# Patient Record
Sex: Female | Born: 1995 | Race: Black or African American | Hispanic: No | Marital: Single | Smoking: Former smoker
Health system: Southern US, Community
[De-identification: ages and names within clinical notes are randomized; demographics above are authoritative.]

## PROBLEM LIST (undated history)

## (undated) DIAGNOSIS — J45909 Unspecified asthma, uncomplicated: Secondary | ICD-10-CM

## (undated) DIAGNOSIS — A749 Chlamydial infection, unspecified: Secondary | ICD-10-CM

## (undated) HISTORY — PX: NO PAST SURGERIES: SHX2092

---
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
## 2015-03-25 NOTE — Discharge Summary (Signed)
PATIENT NAME:  Kathryn Nash, Kathryn Nash MR#:  [id number] DATE OF BIRTH:  02/05/1996  DATE OF ADMISSION:  01/09/2013  DATE OF DISCHARGE:  01/13/2013  PRIMARY CARE PHYSICIAN:  None local.  FINAL DIAGNOSES:   1.  Acute asthmatic exacerbation.  2.  Acute bronchitis.  3.  Leukocytosis.   CONDITION:  Stable.   CODE STATUS:  FULL CODE.   HOME MEDICATIONS:   1.  Proventil HFA 90 mcg inhalation 2 puffs 4 times a day p.r.n. for shortness of breath.  2.  Xopenex 1.25 mg/0.5 mL inhalation solution, 0.5 mL inhaled 3 times a day p.r.n. for shortness of breath.  3.  Qvar 40 mcg inhalation aerosol 2 puffs b.i.d.  4.  Prednisone 10 mg p.o. 4 tablets once a day for 2 days, then 20 mg p.o. daily for 2 days, and then 10 mg p.o. daily for 2 days.   DIET:  Regular diet.   ACTIVITY:  As tolerated.   FOLLOWUP CARE:  Follow up with PCP within 1 week. Followup CBC with PCP.   REASON FOR ADMISSION:  Wheezing and increased shortness of breath.   HOSPITAL COURSE:  The patient is a 19 year old African American female with a history of asthma since childhood and eczema, developed  shortness of breath for a few days with cough and green sputum. The patient also has wheezing. For detailed history and physical examination, please refer to the admission note dictated by Dr. Rudene Rearwish. The patient's x-ray on admission was negative for pneumonia. The patient was admitted for asthma exacerbation with acute bronchitis. After admission, she has been treated with IV Solu-Medrol, nebulizer with albuterol and Atrovent. In addition, the patient has been treated with Zithromax for acute bronchitis. The patient's symptoms have much improved with the above-mentioned treatment. She is off oxygen without shortness of breath. The patient is clinically stable and will be discharged to home today. I discussed the patient's discharge plan with the patient and her mother.   TIME SPENT: About 35 minutes.   ____________________________ Kathryn PollackQing Donley Harland,  MD qc:dm D: 01/13/2013 15:01:15 ET T: 01/13/2013 20:58:18 ET JOB#: [id number]  cc: Kathryn PollackQing Darnell Stimson, MD, <Dictator> Kathryn PollackQING Chino Sardo MD ELECTRONICALLY SIGNED 01/15/2013 20:27

## 2015-03-25 NOTE — H&P (Signed)
PATIENT NAME:  Kathryn Nash, Kathryn Nash MR#:  [id number] DATE OF BIRTH:  08/05/1996  DATE OF ADMISSION:  01/09/2013  PRIMARY CARE PHYSICIAN:  Dr. Geraldo Pitter in [place].   REFERRING PHYSICIAN:  Dr. Maricela Bo.   CHIEF COMPLAINT:  Wheezing and increased shortness of breath.   HISTORY OF PRESENT ILLNESS:  The patient is a 19 year old African American female with history of asthma since childhood and eczema.  She was in her usual state of health until Sunday, a few days ago, when started having cough associated with green sputum and then gradually developed shortness of breath and wheezing.  Denied having any fever.  No chills.  She ended at the Emergency Department on Wednesday, treated and discharged home on prednisone tapering doses, however she got sick again today in school, more wheezing, cough and shortness of breath.  She ended back here at the Emergency Department again wheezing and a picture of acute asthmatic attack.  Her chest x-ray is negative for pneumonia.  The patient received several treatments with a breathing treatment using a bronchodilator.  Now in the process to be admitted for further treatment.   REVIEW OF SYSTEMS:  CONSTITUTIONAL:  Denies any fever.  No chills.  No fatigue.  EYES:  No blurring of vision.  No double vision.  EARS, NOSE, THROAT:  No hearing impairment.  No sore throat.  No dysphagia.  CARDIOVASCULAR:  No chest pain, but reports shortness of breath and wheezing.  No syncope.  No palpitations.  RESPIRATORY:  Cough, sputum production, wheezing and shortness of breath.  No hemoptysis.  No chest pain.  GASTROINTESTINAL:  No abdominal pain, no vomiting, no diarrhea.  GENITOURINARY:  No dysuria.  No frequency of urination.  Her last menstrual period started a week ago and ended yesterday.  MUSCULOSKELETAL:  No joint swelling or pain.  No muscular pain or swelling.  INTEGUMENTARY:  No skin rash.  No ulcers.  NEUROLOGY:  No focal weakness.  No seizure activity.  No headache.   PSYCHIATRY:  No anxiety.  No depression.  ENDOCRINE:  No polyuria or polydipsia.  No heat or cold intolerance.  HEMATOLOGY:  No easy bruisability.  No lymph node enlargement.   PAST MEDICAL HISTORY:  Asthma and eczema.   PAST SURGICAL HISTORY:  None.   FAMILY HISTORY:  Both parents are healthy.  Her maternal grandmother has asthma.   SOCIAL HABITS:  Nonsmoker.  No history of alcohol or drug abuse.   SOCIAL HISTORY:  She is a ninth grader and she lives at home with her mother.   ADMISSION MEDICATIONS:  Xopenex inhalation 3 times a day as needed, QVAR 40 mcg inhalation 2 puffs twice a day, Proventil HFA 2 puffs 4 times a day as needed and she is on a prednisone tapering taking 50 mg a day.   ALLERGIES:  SINGULAR CAUSING SKIN RASH.  EGGS AND FISH CAUSING SKIN RASH AND HIVES.   PHYSICAL EXAMINATION: VITAL SIGNS:  Blood pressure 110/67, respiratory rate 24, pulse 98, temperature 97.7, oxygen saturation 97%.  GENERAL APPEARANCE:  A young female lying in bed in no acute distress at the time of my examination.  HEAD AND NECK:  No pallor.  No icterus.  No cyanosis.  EARS, NOSE, THROAT:  Ear examination revealed normal hearing, no discharge, no lesions.  Nasal mucosa was normal, no discharge, no bleeding.  No ulcers.  Oropharyngeal area was normal without exudate.  No oral thrush.  EYES:  Normal eyelids and conjunctivae.  Pupils about 5 to  6 mm, equal, round and reactive to light.  NECK:  Supple.  Trachea at midline.  No thyromegaly.  No cervical lymphadenopathy.  No masses.  HEART:  Normal S1, S2.  No S3, S4.  No murmur.  No gallop.  No carotid bruits.  RESPIRATORY:  Normal breathing pattern at time of my examination without use of accessory muscles.  Slight prolongation of the expiratory phase and bilateral rhonchi and expiratory wheezing.  No rales.  ABDOMEN:  Soft without tenderness.  No hepatosplenomegaly.  No masses.  No hernias.  SKIN:  No ulcers.  No subcutaneous nodules.   MUSCULOSKELETAL:  No joint swelling.  No clubbing.  NEUROLOGIC:  Cranial nerves II-XII are intact.  No focal motor deficit.  PSYCHIATRIC:  The patient is alert, oriented x 3.  Mood and affect were normal.   LABORATORY FINDINGS:  Chest x-ray showed no acute cardiopulmonary abnormalities.  No consolidation.  No effusion.  CBC showed white count of 19,000, hemoglobin 14, hematocrit 40, platelet count 434.  Serum glucose 116, BUN 16, creatinine 0.6, sodium 141, potassium 3.2.   ASSESSMENT: 1.  Acute asthmatic attack.  2.  Acute bronchitis.  3.  Mild hypokalemia.  4.  Leukocytosis related to the event of acute asthma and bronchitis and probably the steroid effect as well.  5.  History of eczema.   PLAN:  We will admit to the medical floor.  Continue bronchodilator therapy with albuterol and Atrovent.  A small dose of IV Solu-Medrol.  I will give the patient antibiotic since her symptoms started with the cough and green sputum assuming that acute bronchitis had precipitated her acute asthma.  Potassium supplementation to correct the hypokalemia.  We will monitor her response.  She already started to feel better than when she came.  Would like to mention that her last menstrual period again was a week ago and ended yesterday. I encourage early ambulation to prevent DVT. I answered the questions of her mother who was in her room.   TIME SPENT IN EVALUATING THIS PATIENT:  Took more than 50 minutes.     ____________________________ Carney CornersAmir M. Rudene Rearwish, MD amd:ea D: 01/09/2013 23:13:14 ET T: 01/10/2013 00:16:53 ET JOB#: [id number]  cc: Carney CornersAmir M. Rudene Rearwish, MD, <Dictator> Karolee OhsAMIR Dala DockM Kord Monette MD ELECTRONICALLY SIGNED 01/10/2013 1:28

## 2015-03-27 NOTE — H&P (Signed)
    Subjective/Chief Complaint 19 yo F with asthma exacerbation    History of Present Illness 19 yo F with asthma presents with asthma exacerbation.  Per mother, she was well until 2 days ago when she developed a slight cough.  Yesterday started to feel like she couldn't breathe, and started breathing treatments yesterday evening.  Had 4 breathing treatments prior to heading to the ED this afternoon.  In the ED when she was first seen, her initial sats where in the high 80s (per report from ED 88-89.  She was given 3 duonebs (albuterol 3 mg and atrovent 0.5 mg), 1 albuterol neb of 2.5 mg, and 40 mg of oral prednisone.   Sats improved to 92-95 and then after about 15-30 minutes she would dip again to the 88-89.  Was  given another 20 mg of prednisone and then given Albuterol 5 mg x 1 then after another hour Albuterol 5 mg.  Sats came up and then went back down again.  ED put her on oxygen (4L) but were unable to maintain her sats above 90.  Increased oxygen to 6L and sats went up to 92.  When she got to the floor they switched her to a venti mask with FI02 55% and were able to maintain sats at 94 at that point.    Past History Asthma    Primary Physician No PCP   Past Med/Surgical Hx:  asthma:   ALLERGIES:  Singulair: Rash  Review of Systems:   Fever/Chills No    Cough Yes    Abdominal Pain No    Nausea/Vomiting No    SOB/DOE Yes    Chest Pain Yes   Physical Exam:   GEN no acute distress    HEENT moist oral mucosa    NECK supple    RESP RR in 20s, shortened insp and exp phase,  decreased breath sounds in right lower lobe. no retractions or grunting, no wheezing    CARD regular rate  no murmur    LYMPH no LAD   Routine Hem:  05-May-13 21:22    WBC (CBC) 14.9   RBC (CBC) 4.95   Hemoglobin (CBC) 15.2   Hematocrit (CBC) 43.9   Platelet Count (CBC) 325   MCV 89   MCH 30.6   MCHC 34.5   RDW 12.5  Routine Chem:  05-May-13 21:22    Glucose, Serum 103   BUN 9    Creatinine (comp) 0.76   Sodium, Serum 139   Potassium, Serum 3.9   Chloride, Serum 103   CO2, Serum 25   Calcium (Total), Serum 9.6   Anion Gap 11   Osmolality (calc) 276     Assessment/Admission Diagnosis 19 yo with Asthma exacerbation.  Will transfer to [facility] as I discussed case with [facility] and the next option is continuous albuterol.  Discussed we were unable to do this on our floor and [facility] accepted patient.    Plan see above   Electronic Signatures: Pryor MontesMelton, Ziyanna Tolin A (MD)  (Signed 06-May-13 00:18)  Authored: CHIEF COMPLAINT and HISTORY, PAST MEDICAL/SURGIAL HISTORY, ALLERGIES, REVIEW OF SYSTEMS, PHYSICAL EXAM, LABS, ASSESSMENT AND PLAN   Last Updated: 06-May-13 00:18 by Pryor MontesMelton, Reene Harlacher A (MD)

## 2015-04-29 ENCOUNTER — Emergency Department: Payer: Medicaid Other

## 2015-04-29 ENCOUNTER — Emergency Department
Admission: EM | Admit: 2015-04-29 | Discharge: 2015-04-29 | Disposition: A | Discharge status: Home / Self Care | Payer: Medicaid Other | Attending: Emergency Medicine | Admitting: Emergency Medicine

## 2015-04-29 ENCOUNTER — Encounter: Payer: Self-pay | Admitting: Emergency Medicine

## 2015-04-29 DIAGNOSIS — J069 Acute upper respiratory infection, unspecified: Secondary | ICD-10-CM | POA: Diagnosis not present

## 2015-04-29 DIAGNOSIS — J45901 Unspecified asthma with (acute) exacerbation: Secondary | ICD-10-CM

## 2015-04-29 DIAGNOSIS — R05 Cough: Secondary | ICD-10-CM | POA: Diagnosis present

## 2015-04-29 HISTORY — DX: Unspecified asthma, uncomplicated: J45.909

## 2015-04-29 MED ORDER — IPRATROPIUM-ALBUTEROL 0.5-2.5 (3) MG/3ML IN SOLN
RESPIRATORY_TRACT | Status: AC
  Filled 2015-04-29: qty 3

## 2015-04-29 MED ORDER — IPRATROPIUM-ALBUTEROL 0.5-2.5 (3) MG/3ML IN SOLN
3.0000 mL | Freq: Once | RESPIRATORY_TRACT | Status: AC
  Administered 2015-04-29: 3 mL via RESPIRATORY_TRACT

## 2015-04-29 MED ORDER — PREDNISONE 10 MG PO TABS: ORAL_TABLET | ORAL | Status: DC

## 2015-04-29 MED ORDER — PREDNISONE 20 MG PO TABS
ORAL_TABLET | ORAL | Status: AC
  Filled 2015-04-29: qty 3

## 2015-04-29 MED ORDER — PREDNISONE 20 MG PO TABS
60.0000 mg | ORAL_TABLET | Freq: Once | ORAL | Status: AC
  Administered 2015-04-29: 60 mg via ORAL

## 2015-04-29 NOTE — Discharge Instructions (Signed)
Take medication as prescribed. Continue using home albuterol nebulizers and inhaler as needed. Rest. Drink clear fluids.  Follow up with your primary care physician next week.  Return to the ER for new or worsening concerns  Asthma Asthma is a condition of the lungs in which the airways tighten and narrow. Asthma can make it hard to breathe. Asthma cannot be cured, but medicine and lifestyle changes can help control it. Asthma may be started (triggered) by:  Animal skin flakes (dander).  Dust.  Cockroaches.  Pollen.  Mold.  Smoke.  Cleaning products.  Hair sprays or aerosol sprays.  Paint fumes or strong smells.  Cold air, weather changes, and winds.  Crying or laughing hard.  Stress.  Certain medicines or drugs.  Foods, such as dried fruit, potato chips, and sparkling grape juice.  Infections or conditions (colds, flu).  Exercise.  Certain medical conditions or diseases.  Exercise or tiring activities. HOME CARE   Take medicine as told by your doctor.  Use a peak flow meter as told by your doctor. A peak flow meter is a tool that measures how well the lungs are working.  Record and keep track of the peak flow meter's readings.  Understand and use the asthma action plan. An asthma action plan is a written plan for taking care of your asthma and treating your attacks.  To help prevent asthma attacks:  Do not smoke. Stay away from secondhand smoke.  Change your heating and air conditioning filter often.  Limit your use of fireplaces and wood stoves.  Get rid of pests (such as roaches and mice) and their droppings.  Throw away plants if you see mold on them.  Clean your floors. Dust regularly. Use cleaning products that do not smell.  Have someone vacuum when you are not home. Use a vacuum cleaner with a HEPA filter if possible.  Replace carpet with wood, tile, or vinyl flooring. Carpet can trap animal skin flakes and dust.  Use allergy-proof  pillows, mattress covers, and box spring covers.  Wash bed sheets and blankets every week in hot water and dry them in a dryer.  Use blankets that are made of polyester or cotton.  Clean bathrooms and kitchens with bleach. If possible, have someone repaint the walls in these rooms with mold-resistant paint. Keep out of the rooms that are being cleaned and painted.  Wash hands often. GET HELP IF:  You have make a whistling sound when breaking (wheeze), have shortness of breath, or have a cough even if taking medicine to prevent attacks.  The colored mucus you cough up (sputum) is thicker than usual.  The colored mucus you cough up changes from clear or white to yellow, green, gray, or bloody.  You have problems from the medicine you are taking such as:  A rash.  Itching.  Swelling.  Trouble breathing.  You need reliever medicines more than 2-3 times a week.  Your peak flow measurement is still at 50-79% of your personal best after following the action plan for 1 hour.  You have a fever. GET HELP RIGHT AWAY IF:   You seem to be worse and are not responding to medicine during an asthma attack.  You are short of breath even at rest.  You get short of breath when doing very little activity.  You have trouble eating, drinking, or talking.  You have chest pain.  You have a fast heartbeat.  Your lips or fingernails start to turn blue.  You  are light-headed, dizzy, or faint.  Your peak flow is less than 50% of your personal best. MAKE SURE YOU:   Understand these instructions.  Will watch your condition.  Will get help right away if you are not doing well or get worse. Document Released: 05/07/2008 Document Revised: 04/05/2014 Document Reviewed: 06/18/2013 [facility] Patient Information 2015 [place]. This information is not intended to replace advice given to you by your health care provider. Make sure you discuss any questions you have with your health care  provider.  Upper Respiratory Infection, Adult An upper respiratory infection (URI) is also sometimes known as the common cold. The upper respiratory tract includes the nose, sinuses, throat, trachea, and bronchi. Bronchi are the airways leading to the lungs. Most people improve within 1 week, but symptoms can last up to 2 weeks. A residual cough may last even longer.  CAUSES Many different viruses can infect the tissues lining the upper respiratory tract. The tissues become irritated and inflamed and often become very moist. Mucus production is also common. A cold is contagious. You can easily spread the virus to others by oral contact. This includes kissing, sharing a glass, coughing, or sneezing. Touching your mouth or nose and then touching a surface, which is then touched by another person, can also spread the virus. SYMPTOMS  Symptoms typically develop 1 to 3 days after you come in contact with a cold virus. Symptoms vary from person to person. They may include:  Runny nose.  Sneezing.  Nasal congestion.  Sinus irritation.  Sore throat.  Loss of voice (laryngitis).  Cough.  Fatigue.  Muscle aches.  Loss of appetite.  Headache.  Low-grade fever. DIAGNOSIS  You might diagnose your own cold based on familiar symptoms, since most people get a cold 2 to 3 times a year. Your caregiver can confirm this based on your exam. Most importantly, your caregiver can check that your symptoms are not due to another disease such as strep throat, sinusitis, pneumonia, asthma, or epiglottitis. Blood tests, throat tests, and X-rays are not necessary to diagnose a common cold, but they may sometimes be helpful in excluding other more serious diseases. Your caregiver will decide if any further tests are required. RISKS AND COMPLICATIONS  You may be at risk for a more severe case of the common cold if you smoke cigarettes, have chronic heart disease (such as heart failure) or lung disease (such as  asthma), or if you have a weakened immune system. The very young and very old are also at risk for more serious infections. Bacterial sinusitis, middle ear infections, and bacterial pneumonia can complicate the common cold. The common cold can worsen asthma and chronic obstructive pulmonary disease (COPD). Sometimes, these complications can require emergency medical care and may be life-threatening. PREVENTION  The best way to protect against getting a cold is to practice good hygiene. Avoid oral or hand contact with people with cold symptoms. Wash your hands often if contact occurs. There is no clear evidence that vitamin C, vitamin E, echinacea, or exercise reduces the chance of developing a cold. However, it is always recommended to get plenty of rest and practice good nutrition. TREATMENT  Treatment is directed at relieving symptoms. There is no cure. Antibiotics are not effective, because the infection is caused by a virus, not by bacteria. Treatment may include:  Increased fluid intake. Sports drinks offer valuable electrolytes, sugars, and fluids.  Breathing heated mist or steam (vaporizer or shower).  Eating chicken soup or other  clear broths, and maintaining good nutrition.  Getting plenty of rest.  Using gargles or lozenges for comfort.  Controlling fevers with ibuprofen or acetaminophen as directed by your caregiver.  Increasing usage of your inhaler if you have asthma. Zinc gel and zinc lozenges, taken in the first 24 hours of the common cold, can shorten the duration and lessen the severity of symptoms. Pain medicines may help with fever, muscle aches, and throat pain. A variety of non-prescription medicines are available to treat congestion and runny nose. Your caregiver can make recommendations and may suggest nasal or lung inhalers for other symptoms.  HOME CARE INSTRUCTIONS   Only take over-the-counter or prescription medicines for pain, discomfort, or fever as directed by your  caregiver.  Use a warm mist humidifier or inhale steam from a shower to increase air moisture. This may keep secretions moist and make it easier to breathe.  Drink enough water and fluids to keep your urine clear or pale yellow.  Rest as needed.  Return to work when your temperature has returned to normal or as your caregiver advises. You may need to stay home longer to avoid infecting others. You can also use a face mask and careful hand washing to prevent spread of the virus. SEEK MEDICAL CARE IF:   After the first few days, you feel you are getting worse rather than better.  You need your caregiver's advice about medicines to control symptoms.  You develop chills, worsening shortness of breath, or brown or red sputum. These may be signs of pneumonia.  You develop yellow or brown nasal discharge or pain in the face, especially when you bend forward. These may be signs of sinusitis.  You develop a fever, swollen neck glands, pain with swallowing, or white areas in the back of your throat. These may be signs of strep throat. SEEK IMMEDIATE MEDICAL CARE IF:   You have a fever.  You develop severe or persistent headache, ear pain, sinus pain, or chest pain.  You develop wheezing, a prolonged cough, cough up blood, or have a change in your usual mucus (if you have chronic lung disease).  You develop sore muscles or a stiff neck. Document Released: 05/15/2001 Document Revised: 02/11/2012 Document Reviewed: 02/24/2014 [facility] Patient Information 2015 [place], [place]. This information is not intended to replace advice given to you by your health care provider. Make sure you discuss any questions you have with your health care provider.

## 2015-04-29 NOTE — ED Provider Notes (Signed)
[facility] Emergency Department Provider Note ____________________________________________  Time seen: Approximately 1:45 PM  I have reviewed the triage vital signs and the nursing notes.   HISTORY  Chief Complaint Chest Pain and Cough   HPI Kathryn Nash is a 19 y.o. female presents to the ER for complaints of 3 days of runny nose, cough, congestion. Patient states with intermittent shortness of breath with coughing. patient states that she has asthma which flares up each time she is sick.Reports this is similar to her normal flare ups.  Patient denies current chest pain, reports intermittent chest pain described as soreness on sides from coughing.States chest feels "tight" with wheezes. Denies current chest pain or shortness of breath.   Denies fever. Patient denies current chest pain or shortness of breath. Reports intermittent wheezing, states worse at night. Denies abdominal pain, nausea, vomiting, diarrhea. Reports continues to eat and drink well.   Past Medical History  Diagnosis Date  . Asthma     There are no active problems to display for this patient.   History reviewed. No pertinent past surgical history.  Current Outpatient Rx  Name  Route  Sig  Dispense  Refill               Albuterol nebs ProAir inhaler   Allergies Montelukast sodium  No family history on file.  Social History History  Substance Use Topics  . Smoking status: Never Smoker   . Smokeless tobacco: Not on file  . Alcohol Use: No    Review of Systems Constitutional: No fever/chills Eyes: No visual changes. ENT: Positive for congestion and sore throat Cardiovascular: Denies chest pain. Respiratory: Positive for cough. Positive for wheezes. Positive for intermittent shortness of breath with cough and wheezing Gastrointestinal: No abdominal pain.  No nausea, no vomiting.  No diarrhea.  No constipation. Genitourinary: Negative for dysuria. Musculoskeletal:  Negative for back pain. Skin: Negative for rash. Neurological: Negative for headaches, focal weakness or numbness.  10-point ROS otherwise negative.  ____________________________________________   PHYSICAL EXAM:  VITAL SIGNS: ED Triage Vitals  Enc Vitals Group     BP 04/29/15 1216 108/63 mmHg     Pulse Rate 04/29/15 1216 91     Resp 04/29/15 1216 18     Temp 04/29/15 1216 97.5 F (36.4 C)     Temp Source 04/29/15 1216 Oral     SpO2 04/29/15 1216 99 %     Weight 04/29/15 1216 150 lb (68.04 kg)     Height 04/29/15 1216 5\' 6"  (1.676 m)     Head Cir --      Peak Flow --      Pain Score 04/29/15 1216 6     Pain Loc --      Pain Edu? --      Excl. in GC? --     Constitutional: Alert and oriented. Well appearing and in no acute distress. Eyes: Conjunctivae are normal. PERRL. EOMI. Head: Atraumatic. Ears: no erythema, normal TMs.  Nose: Mild clear rhinorrhea and congestion noted Mouth/Throat: Mucous membranes are moist.  Oropharynx non-erythematous. Neck: No stridor.  No cervical spine tenderness to palpation. Hematological/Lymphatic/Immunilogical: No cervical lymphadenopathy. Cardiovascular: Normal rate, regular rhythm. Grossly normal heart sounds.  Good peripheral circulation. Respiratory: Normal respiratory effort.  No retractions. Mild wheezes throughout. No rhonchi or rales Gastrointestinal: Soft and nontender. No distention. No abdominal bruits. No CVA tenderness. Musculoskeletal: No lower extremity tenderness nor edema.  No joint effusions. Neurologic:  Normal speech and language. No  gross focal neurologic deficits are appreciated. Speech is normal. No gait instability. Skin:  Skin is warm, dry and intact. No rash noted. Psychiatric: Mood and affect are normal. Speech and behavior are normal.   RADIOLOGY  CHEST 2 VIEW  COMPARISON: November 21, 2012  FINDINGS: Lungs are clear. Heart size and pulmonary vascularity are normal. No adenopathy. No pneumothorax. No  bone lesions.  IMPRESSION: No edema or consolidation.   Electronically Signed By: Bretta Bang III M.D. On: 04/29/2015 13:57 ________________________________________   INITIAL IMPRESSION / ASSESSMENT AND PLAN / ED COURSE  Pertinent labs & imaging results that were available during my care of the patient were reviewed by me and considered in my medical decision making (see chart for details).  No acute distress. Very well-appearing patient. Patient reports she has had 3 days history of runny nose cough and congestion. Patient states history of asthma and she is having intermittent wheezing and shortness of breath with coughing. Denies current chest pain or shortness of breath.  1440: Chest x-ray negative for edema or consolidation. Suspect a viral upper respiratory infection with acute asthma exacerbation. Patient reports improved in ER and feeling better. Denies chest pain or shortness of breath. Patient states wants to go home. Patient to continue home albuterol nebs as needed. Will discharge with prednisone taper. Patient followed closely next week with primary care physician discussed return parameters.   ____________________________________________   FINAL CLINICAL IMPRESSION(S) / ED DIAGNOSES  Final diagnoses:  Upper respiratory infection  Asthma, unspecified asthma severity, with acute exacerbation      Renford Dills, NP 04/29/15 1502  Minna Antis, MD 04/29/15 [phone]

## 2015-04-29 NOTE — ED Notes (Signed)
Patient states she has chest pain with cough. States symptoms began with sore throat approx one week ago. Patient in no distress.

## 2015-04-29 NOTE — ED Notes (Signed)
Pt reports fever today; cough x3 days; pt reports rib pain with cough and shortness of breath; pt with hx of asthma.

## 2015-04-29 NOTE — ED Notes (Signed)
Pt informed to return if any life threatening symptoms occur.  

## 2015-05-09 ENCOUNTER — Encounter: Payer: Self-pay | Admitting: Emergency Medicine

## 2015-05-09 ENCOUNTER — Emergency Department
Admission: EM | Admit: 2015-05-09 | Discharge: 2015-05-09 | Disposition: A | Discharge status: Home / Self Care | Payer: Medicaid Other | Attending: Emergency Medicine | Admitting: Emergency Medicine

## 2015-05-09 DIAGNOSIS — S01112A Laceration without foreign body of left eyelid and periocular area, initial encounter: Secondary | ICD-10-CM | POA: Diagnosis not present

## 2015-05-09 DIAGNOSIS — Y9389 Activity, other specified: Secondary | ICD-10-CM | POA: Diagnosis not present

## 2015-05-09 DIAGNOSIS — Z7952 Long term (current) use of systemic steroids: Secondary | ICD-10-CM | POA: Insufficient documentation

## 2015-05-09 DIAGNOSIS — S0181XA Laceration without foreign body of other part of head, initial encounter: Secondary | ICD-10-CM | POA: Diagnosis present

## 2015-05-09 DIAGNOSIS — Y9289 Other specified places as the place of occurrence of the external cause: Secondary | ICD-10-CM | POA: Insufficient documentation

## 2015-05-09 DIAGNOSIS — Y998 Other external cause status: Secondary | ICD-10-CM | POA: Diagnosis not present

## 2015-05-09 DIAGNOSIS — S0512XA Contusion of eyeball and orbital tissues, left eye, initial encounter: Secondary | ICD-10-CM

## 2015-05-09 MED ORDER — FLUORESCEIN SODIUM 1 MG OP STRP
ORAL_STRIP | OPHTHALMIC | Status: AC
  Administered 2015-05-09: 1
  Filled 2015-05-09: qty 1

## 2015-05-09 MED ORDER — TRAMADOL HCL 50 MG PO TABS: 50.0000 mg | ORAL_TABLET | Freq: Two times a day (BID) | ORAL | Status: DC

## 2015-05-09 MED ORDER — TRAMADOL HCL 50 MG PO TABS
ORAL_TABLET | ORAL | Status: AC
  Administered 2015-05-09: 100 mg via ORAL
  Filled 2015-05-09: qty 2

## 2015-05-09 MED ORDER — TETRACAINE HCL 0.5 % OP SOLN
OPHTHALMIC | Status: AC
  Administered 2015-05-09: 1 [drp]
  Filled 2015-05-09: qty 2

## 2015-05-09 MED ORDER — TRAMADOL HCL 50 MG PO TABS
100.0000 mg | ORAL_TABLET | Freq: Once | ORAL | Status: AC
  Administered 2015-05-09: 100 mg via ORAL
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
## 2015-05-09 NOTE — Discharge Instructions (Signed)
Assault, General Assault includes any behavior, whether intentional or reckless, which results in bodily injury to another person and/or damage to property. Included in this would be any behavior, intentional or reckless, that by its nature would be understood (interpreted) by a reasonable person as intent to harm another person or to damage his/her property. Threats may be oral or written. They may be communicated through regular mail, computer, fax, or phone. These threats may be direct or implied. FORMS OF ASSAULT INCLUDE:  Physically assaulting a person. This includes physical threats to inflict physical harm as well as:  Slapping.  Hitting.  Poking.  Kicking.  Punching.  Pushing.  Arson.  Sabotage.  Equipment vandalism.  Damaging or destroying property.  Throwing or hitting objects.  Displaying a weapon or an object that appears to be a weapon in a threatening manner.  Carrying a firearm of any kind.  Using a weapon to harm someone.  Using greater physical size/strength to intimidate another.  Making intimidating or threatening gestures.  Bullying.  Hazing.  Intimidating, threatening, hostile, or abusive language directed toward another person.  It communicates the intention to engage in violence against that person. And it leads a reasonable person to expect that violent behavior may occur.  Stalking another person. IF IT HAPPENS AGAIN:  Immediately call for emergency help (911 in [place]  If someone poses clear and immediate danger to you, seek legal authorities to have a protective or restraining order put in place.  Less threatening assaults can at least be reported to authorities. STEPS TO TAKE IF A SEXUAL ASSAULT HAS HAPPENED  Go to an area of safety. This may include a shelter or staying with a friend. Stay away from the area where you have been attacked. A large percentage of sexual assaults are caused by a friend, relative or associate.  If  medications were given by your caregiver, take them as directed for the full length of time prescribed.  Only take over-the-counter or prescription medicines for pain, discomfort, or fever as directed by your caregiver.  If you have come in contact with a sexual disease, find out if you are to be tested again. If your caregiver is concerned about the HIV/AIDS virus, he/she may require you to have continued testing for several months.  For the protection of your privacy, test results can not be given over the phone. Make sure you receive the results of your test. If your test results are not back during your visit, make an appointment with your caregiver to find out the results. Do not assume everything is normal if you have not heard from your caregiver or the medical facility. It is important for you to follow up on all of your test results.  File appropriate papers with authorities. This is important in all assaults, even if it has occurred in a family or by a friend. SEEK MEDICAL CARE IF:  You have new problems because of your injuries.  You have problems that may be because of the medicine you are taking, such as:  Rash.  Itching.  Swelling.  Trouble breathing.  You develop belly (abdominal) pain, feel sick to your stomach (nausea) or are vomiting.  You begin to run a temperature.  You need supportive care or referral to a rape crisis center. These are centers with trained personnel who can help you get through this ordeal. SEEK IMMEDIATE MEDICAL CARE IF:  You are afraid of being threatened, beaten, or abused. In [place] call 911.  You  receive new injuries related to abuse.  You develop severe pain in any area injured in the assault or have any change in your condition that concerns you.  You faint or lose consciousness.  You develop chest pain or shortness of breath. Document Released: 11/19/2005 Document Revised: 02/11/2012 Document Reviewed: 07/07/2008 [facility] Patient  Information 2015 [place], [place]. This information is not intended to replace advice given to you by your health care provider. Make sure you discuss any questions you have with your health care provider.  Eye Contusion  An eye contusion is a deep bruise of the eye. It is often called a "black eye." Contusions happen when an injury causes bleeding under the skin. Signs of bruising include pain, puffiness (swelling), and discolored skin. The contusion may turn blue, purple, or yellow. A black eye can be very serious and can affect the eyeball and sight. HOME CARE  Put ice on the injured area.  Put ice in a plastic bag.  Place a towel between your skin and the bag.  Leave the ice on for 15-20 minutes, 03-04 times a day.  If there is no injury to the eye, you may keep doing normal activities.  Wear sunglasses if bright lights bother you.  Sleep with your head raised (elevated) to help with discomfort.  Only take medicines as told by your doctor. GET HELP RIGHT AWAY IF:  You notice any vision loss.  You see two of everything (double vision).  You feel sick to your stomach (nauseous).  You feel dizzy, sleepy, or like you will pass out (faint).  You have any fluid coming from your eye or nose.  You have puffiness and bruising that does not fade. MAKE SURE YOU:   Understand these instructions.  Will watch your condition.  Will get help right away if you are not doing well or get worse. Document Released: 11/08/2011 Document Revised: 02/11/2012 Document Reviewed: 11/08/2011 [facility] Patient Information 2015 [place], [place]. This information is not intended to replace advice given to you by your health care provider. Make sure you discuss any questions you have with your health care provider.  Facial Laceration A facial laceration is a cut on the face. These injuries can be painful and cause bleeding. Some cuts may need to be closed with stitches (sutures), skin adhesive strips, or  wound glue. Cuts usually heal quickly but can leave a scar. It can take 1-2 years for the scar to go away completely. HOME CARE   Only take medicines as told by your doctor.  Follow your doctor's instructions for wound care. For Stitches:  Keep the cut clean and dry.  If you have a bandage (dressing), change it at least once a day. Change the bandage if it gets wet or dirty, or as told by your doctor.  Wash the cut with soap and water 2 times a day. Rinse the cut with water. Pat it dry with a clean towel.  Put a thin layer of medicated cream on the cut as told by your doctor.  You may shower after the first 24 hours. Do not soak the cut in water until the stitches are removed.  Have your stitches removed as told by your doctor.  Do not wear any makeup until a few days after your stitches are removed. For Skin Adhesive Strips:  Keep the cut clean and dry.  Do not get the strips wet. You may take a bath, but be careful to keep the cut dry.  If the  cut gets wet, pat it dry with a clean towel.  The strips will fall off on their own. Do not remove the strips that are still stuck to the cut. For Wound Glue:  You may shower or take baths. Do not soak or scrub the cut. Do not swim. Avoid heavy sweating until the glue falls off on its own. After a shower or bath, pat the cut dry with a clean towel.  Do not put medicine or makeup on your cut until the glue falls off.  If you have a bandage, do not put tape over the glue.  Avoid lots of sunlight or tanning lamps until the glue falls off.  The glue will fall off on its own in 5-10 days. Do not pick at the glue. After Healing: Put sunscreen on the cut for the first year to reduce your scar. GET HELP RIGHT AWAY IF:   Your cut area gets red, painful, or puffy (swollen).  You see a yellowish-white fluid (pus) coming from the cut.  You have chills or a fever. MAKE SURE YOU:   Understand these instructions.  Will watch your  condition.  Will get help right away if you are not doing well or get worse. Document Released: 05/07/2008 Document Revised: 09/09/2013 Document Reviewed: 07/02/2013 [facility] Patient Information 2015 [employer] [place], [place]. This information is not intended to replace advice given to you by your health care provider. Make sure you discuss any questions you have with your health care provider.  Keep your laceration and glue clean, dry, and covered. Do NOT apply oil, lotion, antibiotic ointment, or eye make-up to the area until it is completely healed.  Apply ice to reduce swelling.  Follow-up with your provider as needed. Take the steroid your were previously prescribed along with this Tramadol as needed for pain.
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
## 2015-11-20 ENCOUNTER — Emergency Department
Admission: EM | Admit: 2015-11-20 | Discharge: 2015-11-20 | Disposition: A | Discharge status: Home / Self Care | Payer: Medicaid Other | Attending: Student | Admitting: Student

## 2015-11-20 ENCOUNTER — Emergency Department: Payer: Medicaid Other

## 2015-11-20 DIAGNOSIS — R05 Cough: Secondary | ICD-10-CM | POA: Diagnosis present

## 2015-11-20 DIAGNOSIS — J45901 Unspecified asthma with (acute) exacerbation: Secondary | ICD-10-CM | POA: Diagnosis not present

## 2015-11-20 MED ORDER — PREDNISONE 20 MG PO TABS
60.0000 mg | ORAL_TABLET | Freq: Once | ORAL | Status: AC
  Administered 2015-11-20: 60 mg via ORAL
  Filled 2015-11-20: qty 3

## 2015-11-20 MED ORDER — ALBUTEROL SULFATE HFA 108 (90 BASE) MCG/ACT IN AERS: 2.0000 | INHALATION_SPRAY | Freq: Four times a day (QID) | RESPIRATORY_TRACT | Status: DC | PRN

## 2015-11-20 MED ORDER — IPRATROPIUM-ALBUTEROL 0.5-2.5 (3) MG/3ML IN SOLN
3.0000 mL | Freq: Once | RESPIRATORY_TRACT | Status: AC
  Administered 2015-11-20: 3 mL via RESPIRATORY_TRACT
  Filled 2015-11-20: qty 3

## 2015-11-20 MED ORDER — PREDNISONE 10 MG PO TABS: 50.0000 mg | ORAL_TABLET | Freq: Every day | ORAL | Status: DC

## 2015-11-20 NOTE — Discharge Instructions (Signed)
Asthma, Adult Asthma is a condition of the lungs in which the airways tighten and narrow. Asthma can make it hard to breathe. Asthma cannot be cured, but medicine and lifestyle changes can help control it. Asthma may be started (triggered) by:  Animal skin flakes (dander).  Dust.  Cockroaches.  Pollen.  Mold.  Smoke.  Cleaning products.  Hair sprays or aerosol sprays.  Paint fumes or strong smells.  Cold air, weather changes, and winds.  Crying or laughing hard.  Stress.  Certain medicines or drugs.  Foods, such as dried fruit, potato chips, and sparkling grape juice.  Infections or conditions (colds, flu).  Exercise.  Certain medical conditions or diseases.  Exercise or tiring activities. HOME CARE   Take medicine as told by your doctor.  Use a peak flow meter as told by your doctor. A peak flow meter is a tool that measures how well the lungs are working.  Record and keep track of the peak flow meter's readings.  Understand and use the asthma action plan. An asthma action plan is a written plan for taking care of your asthma and treating your attacks.  To help prevent asthma attacks:  Do not smoke. Stay away from secondhand smoke.  Change your heating and air conditioning filter often.  Limit your use of fireplaces and wood stoves.  Get rid of pests (such as roaches and mice) and their droppings.  Throw away plants if you see mold on them.  Clean your floors. Dust regularly. Use cleaning products that do not smell.  Have someone vacuum when you are not home. Use a vacuum cleaner with a HEPA filter if possible.  Replace carpet with wood, tile, or vinyl flooring. Carpet can trap animal skin flakes and dust.  Use allergy-proof pillows, mattress covers, and box spring covers.  Wash bed sheets and blankets every week in hot water and dry them in a dryer.  Use blankets that are made of polyester or cotton.  Clean bathrooms and kitchens with bleach.  If possible, have someone repaint the walls in these rooms with mold-resistant paint. Keep out of the rooms that are being cleaned and painted.  Wash hands often. GET HELP IF:  You have make a whistling sound when breaking (wheeze), have shortness of breath, or have a cough even if taking medicine to prevent attacks.  The colored mucus you cough up (sputum) is thicker than usual.  The colored mucus you cough up changes from clear or white to yellow, green, gray, or bloody.  You have problems from the medicine you are taking such as:  A rash.  Itching.  Swelling.  Trouble breathing.  You need reliever medicines more than 2-3 times a week.  Your peak flow measurement is still at 50-79% of your personal best after following the action plan for 1 hour.  You have a fever. GET HELP RIGHT AWAY IF:   You seem to be worse and are not responding to medicine during an asthma attack.  You are short of breath even at rest.  You get short of breath when doing very little activity.  You have trouble eating, drinking, or talking.  You have chest pain.  You have a fast heartbeat.  Your lips or fingernails start to turn blue.  You are light-headed, dizzy, or faint.  Your peak flow is less than 50% of your personal best.   This information is not intended to replace advice given to you by your health care provider. Make sure   you discuss any questions you have with your health care provider.   Document Released: 05/07/2008 Document Revised: 08/10/2015 Document Reviewed: 06/18/2013 [employer] Interactive Patient Education 2016 [employer]  

## 2015-11-20 NOTE — ED Provider Notes (Signed)
[facility] Emergency Department Provider Note  ____________________________________________  Time seen: Approximately 11:08 AM  I have reviewed the triage vital signs and the nursing notes.   HISTORY  Chief Complaint Nasal Congestion and Cough   HPI Kathryn Nash is a 19 y.o. female who presents to the emergency department for evaluation of cough and wheezing. She has been having to use her inhaler frequently for the past 3 weeks. It didn't help today so she came to the emergency department. She denies fever. Cough is nonproductive and worse in the morning and at night.   Past Medical History  Diagnosis Date  . Asthma     There are no active problems to display for this patient.   No past surgical history on file.  Current Outpatient Rx  Name  Route  Sig  Dispense  Refill  . albuterol (PROVENTIL HFA;VENTOLIN HFA) 108 (90 BASE) MCG/ACT inhaler   Inhalation   Inhale 2 puffs into the lungs every 6 (six) hours as needed for wheezing or shortness of breath.   1 Inhaler   2   . predniSONE (DELTASONE) 10 MG tablet   Oral   Take 5 tablets (50 mg total) by mouth daily.   25 tablet   0     Allergies Montelukast sodium  No family history on file.  Social History Social History  Substance Use Topics  . Smoking status: Never Smoker   . Smokeless tobacco: Not on file  . Alcohol Use: No    Review of Systems Constitutional: No fever/chills Eyes: No visual changes. ENT: No sore throat. Cardiovascular: Denies chest pain. Respiratory: Positive for  shortness of breath. Positive for cough. Gastrointestinal: Negative for abdominal pain. Negative for nausea,  Negative for vomiting.  Negative for diarrhea.  Genitourinary: Negative for dysuria. Musculoskeletal: Negative for body aches Skin: Negative for rash. Neurological: negative for headaches, Negative for focal weakness or numbness.  10-point ROS otherwise  negative.  ____________________________________________   PHYSICAL EXAM:  VITAL SIGNS: ED Triage Vitals  Enc Vitals Group     BP 11/20/15 1016 113/78 mmHg     Pulse Rate 11/20/15 1016 97     Resp --      Temp 11/20/15 1016 99.1 F (37.3 C)     Temp Source 11/20/15 1016 Oral     SpO2 11/20/15 1016 96 %     Weight 11/20/15 1016 174 lb (78.926 kg)     Height 11/20/15 1016  (1.651 m)     Head Cir --      Peak Flow --      Pain Score 11/20/15 1016 4     Pain Loc --      Pain Edu? --      Excl. in GC? --     Constitutional: Alert and oriented. Well appearing and in no acute distress. Eyes: Conjunctivae are normal. PERRL. EOMI. Ears: Normal TMs. Head: Atraumatic. Nose: No congestion/rhinnorhea. Mouth/Throat: Mucous membranes are moist.  Oropharynx non-erythematous. Neck: No stridor.  Lymphatic: No cervical lymphadenopathy. Cardiovascular: Normal rate, regular rhythm. Grossly normal heart sounds.  Good peripheral circulation. Respiratory: Normal respiratory effort.  No retractions. Expiratory wheezes in bilateral bases. Gastrointestinal: Soft and nontender. No distention. No abdominal bruits. No CVA tenderness. Musculoskeletal: No joint pain reported. Neurologic:  Normal speech and language. No gross focal neurologic deficits are appreciated. Speech is normal. No gait instability. Skin:  Skin is warm, dry and intact. No rash noted. Psychiatric: Mood and affect are normal. Speech  and behavior are normal.  ____________________________________________   LABS (all labs ordered are listed, but only abnormal results are displayed)  Labs Reviewed - No data to display ____________________________________________  EKG  Not indicated ____________________________________________  RADIOLOGY  Chest x-ray negative for acute abnormality. ____________________________________________   PROCEDURES  Procedure(s) performed: None  Critical Care performed:  No  ____________________________________________   INITIAL IMPRESSION / ASSESSMENT AND PLAN / ED COURSE  Pertinent labs & imaging results that were available during my care of the patient were reviewed by me and considered in my medical decision making (see chart for details).  Duoneb given in the ER as well as Prednisone 60mg  with improvement.   On re-exam, good air movement throughout with end expiratory wheeze noted in right upper lobe. Patient reports feeling better. Will discharge home with Prednisone and a refill of her albuterol. She is to follow up with her PCP for symptoms that are not improving/return. She was advised to return to the ER for symptoms that change or worsen if unable to schedule an appointment. ____________________________________________   FINAL CLINICAL IMPRESSION(S) / ED DIAGNOSES  Final diagnoses:  Asthma with acute exacerbation, unspecified asthma severity       Chinita PesterCari B Zuleyka Kloc, FNP 11/20/15 1234  Gayla DossEryka A Gayle, MD 11/20/15 1616

## 2015-11-20 NOTE — ED Notes (Signed)
Pt reports cough/congestion x 3 weeks. Has been treating at home with nebulizer treatments.

## 2015-11-20 NOTE — ED Notes (Signed)
Patient presents [facility] from home for an asthma attack.  Per EMS patient used breathing treatments at home and lung sounds were clear on their assessment.  Patient is in no obvious distress at this time.  Patient reports cough/congestion x 1 week and reports feeling chest pain when she coughs.

## 2016-02-16 ENCOUNTER — Encounter: Payer: Self-pay | Admitting: *Deleted

## 2016-02-16 ENCOUNTER — Emergency Department: Payer: Medicaid Other

## 2016-02-16 ENCOUNTER — Emergency Department
Admission: EM | Admit: 2016-02-16 | Discharge: 2016-02-16 | Disposition: A | Discharge status: Home / Self Care | Payer: Medicaid Other | Attending: Emergency Medicine | Admitting: Emergency Medicine

## 2016-02-16 DIAGNOSIS — F419 Anxiety disorder, unspecified: Secondary | ICD-10-CM | POA: Insufficient documentation

## 2016-02-16 DIAGNOSIS — J45901 Unspecified asthma with (acute) exacerbation: Secondary | ICD-10-CM | POA: Insufficient documentation

## 2016-02-16 DIAGNOSIS — R1011 Right upper quadrant pain: Secondary | ICD-10-CM | POA: Diagnosis not present

## 2016-02-16 DIAGNOSIS — Z3202 Encounter for pregnancy test, result negative: Secondary | ICD-10-CM | POA: Diagnosis not present

## 2016-02-16 DIAGNOSIS — R109 Unspecified abdominal pain: Secondary | ICD-10-CM | POA: Diagnosis present

## 2016-02-16 LAB — CBC WITH DIFFERENTIAL/PLATELET
Basophils Absolute: 0.1 10*3/uL (ref 0–0.1)
Basophils Relative: 1 %
Eosinophils Absolute: 1.8 10*3/uL — ABNORMAL HIGH (ref 0–0.7)
Eosinophils Relative: 12 %
HCT: 44 % (ref 35.0–47.0)
Hemoglobin: 15.2 g/dL (ref 12.0–16.0)
LYMPHS ABS: 4 10*3/uL — AB (ref 1.0–3.6)
Lymphocytes Relative: 25 %
MCH: 29 pg (ref 26.0–34.0)
MCHC: 34.5 g/dL (ref 32.0–36.0)
MCV: 84.1 fL (ref 80.0–100.0)
MONOS PCT: 7 %
Monocytes Absolute: 1.1 10*3/uL — ABNORMAL HIGH (ref 0.2–0.9)
NEUTROS ABS: 9 10*3/uL — AB (ref 1.4–6.5)
Neutrophils Relative %: 55 %
Platelets: 406 10*3/uL (ref 150–440)
RBC: 5.24 MIL/uL — ABNORMAL HIGH (ref 3.80–5.20)
RDW: 13.3 % (ref 11.5–14.5)
WBC: 16 10*3/uL — ABNORMAL HIGH (ref 3.6–11.0)

## 2016-02-16 LAB — COMPREHENSIVE METABOLIC PANEL
ALK PHOS: 83 U/L (ref 38–126)
ALT: 20 U/L (ref 14–54)
ANION GAP: 7 (ref 5–15)
AST: 23 U/L (ref 15–41)
Albumin: 4.1 g/dL (ref 3.5–5.0)
BILIRUBIN TOTAL: 0.5 mg/dL (ref 0.3–1.2)
BUN: 10 mg/dL (ref 6–20)
CO2: 23 mmol/L (ref 22–32)
Calcium: 9.1 mg/dL (ref 8.9–10.3)
Chloride: 108 mmol/L (ref 101–111)
Creatinine, Ser: 0.81 mg/dL (ref 0.44–1.00)
Glucose, Bld: 105 mg/dL — ABNORMAL HIGH (ref 65–99)
POTASSIUM: 3.9 mmol/L (ref 3.5–5.1)
Sodium: 138 mmol/L (ref 135–145)
TOTAL PROTEIN: 7.7 g/dL (ref 6.5–8.1)

## 2016-02-16 LAB — URINALYSIS COMPLETE WITH MICROSCOPIC (ARMC ONLY)
BILIRUBIN URINE: NEGATIVE
Bacteria, UA: NONE SEEN
GLUCOSE, UA: NEGATIVE mg/dL
Hgb urine dipstick: NEGATIVE
Ketones, ur: NEGATIVE mg/dL
NITRITE: NEGATIVE
PH: 5 (ref 5.0–8.0)
Protein, ur: 30 mg/dL — AB
Specific Gravity, Urine: 1.031 — ABNORMAL HIGH (ref 1.005–1.030)

## 2016-02-16 LAB — POCT PREGNANCY, URINE: Preg Test, Ur: NEGATIVE

## 2016-02-16 LAB — LIPASE, BLOOD: LIPASE: 15 U/L (ref 11–51)

## 2016-02-16 MED ORDER — PREDNISONE 20 MG PO TABS: 40.0000 mg | ORAL_TABLET | Freq: Every day | ORAL | Status: AC

## 2016-02-16 MED ORDER — KETOROLAC TROMETHAMINE 30 MG/ML IJ SOLN
15.0000 mg | Freq: Once | INTRAMUSCULAR | Status: AC
  Administered 2016-02-16: 15 mg via INTRAVENOUS

## 2016-02-16 MED ORDER — KETOROLAC TROMETHAMINE 30 MG/ML IJ SOLN
INTRAMUSCULAR | Status: AC
  Administered 2016-02-16: 15 mg via INTRAVENOUS
  Filled 2016-02-16: qty 1

## 2016-02-16 NOTE — ED Provider Notes (Signed)
Time Seen: Approximately 0906 I have reviewed the triage notes  Chief Complaint: Flank Pain   History of Present Illness: Kathryn Nash is a 20 y.o. female who presents with multiple complaints. Patient's been having some cough and cold symptoms and states that is been occurring over the past week. She states that she had an episode today with coughing and had increased pain toward the right flank area. She states her primary physician and established her on an antibiotic and she has not shown any improvements with her cough and cold symptoms. She denies any high fever at home. She denies any dysuria, hematuria, urinary frequency. She denies any risk of being pregnant denies any current vaginal discharge or bleeding. She states she's had occasional right-sided headaches without any photophobia, blurred vision loss of vision or neck pain. Past Medical History  Diagnosis Date  . Asthma     There are no active problems to display for this patient.   History reviewed. No pertinent past surgical history.  History reviewed. No pertinent past surgical history.  Current Outpatient Rx  Name  Route  Sig  Dispense  Refill  . albuterol (PROVENTIL HFA;VENTOLIN HFA) 108 (90 BASE) MCG/ACT inhaler   Inhalation   Inhale 2 puffs into the lungs every 6 (six) hours as needed for wheezing or shortness of breath.   1 Inhaler   2   . predniSONE (DELTASONE) 10 MG tablet   Oral   Take 5 tablets (50 mg total) by mouth daily.   25 tablet   0     Allergies:  Montelukast sodium  Family History: No family history on file.  Social History: Social History  Substance Use Topics  . Smoking status: Never Smoker   . Smokeless tobacco: None  . Alcohol Use: No     Review of Systems:   10 point review of systems was performed and was otherwise negative:  Constitutional: No fever Eyes: No visual disturbances ENT: No sore throat, ear pain Cardiac: No chest pain Respiratory: No shortness of  breath, wheezing, or stridor Abdomen: Pain radiates from the right flank and right and posterior abdomen toward the right lower quadrant. Endocrine: No weight loss, No night sweats Extremities: No peripheral edema, cyanosis Skin: No rashes, easy bruising Neurologic: No focal weakness, trouble with speech or swollowing Urologic: No dysuria, Hematuria, or urinary frequency   Physical Exam:  ED Triage Vitals  Enc Vitals Group     BP 02/16/16 0841 132/82 mmHg     Pulse Rate 02/16/16 0841 58     Resp 02/16/16 0841 20     Temp 02/16/16 0841 97.9 F (36.6 C)     Temp Source 02/16/16 0841 Oral     SpO2 02/16/16 0841 98 %     Weight 02/16/16 0841 192 lb (87.091 kg)     Height 02/16/16 0841  (1.676 m)     Head Cir --      Peak Flow --      Pain Score 02/16/16 0842 9     Pain Loc --      Pain Edu? --      Excl. in GC? --     General: Awake , Alert , and Oriented times 3; GCS 15, very anxious Head: Normal cephalic , atraumatic Eyes: Pupils equal , round, reactive to light Nose/Throat: No nasal drainage, patent upper airway without erythema or exudate.  Neck: Supple, Full range of motion, No anterior adenopathy or palpable thyroid masses Lungs: Mild  end expiratory wheezing without rales or rhonchi Heart: Regular rate, regular rhythm without murmurs , gallops , or rubs Abdomen: Patient has some reproducible pain in the right upper abdominal region toward the right flank area. She also has pain with palpation toward the midline lumbar spine region states pains exacerbated by movement. Bowel sounds are positive in all 4 quadrants. No peritoneal signs. No focal tenderness over McBurney's point, negative Murphy's sign.        Extremities: 2 plus symmetric pulses. No edema, clubbing or cyanosis Neurologic: normal ambulation, Motor symmetric without deficits, sensory intact Skin: warm, dry, no rashes   Labs:   All laboratory work was reviewed including any pertinent negatives or  positives listed below:  Labs Reviewed  URINALYSIS COMPLETEWITH MICROSCOPIC ([facility] ONLY)  CBC WITH DIFFERENTIAL/PLATELET  COMPREHENSIVE METABOLIC PANEL  LIPASE, BLOOD  POC URINE PREG, ED   laboratory work was reviewed which showed an elevated white blood cell count but otherwise no other significant findings.    Radiology: CLINICAL DATA: Three-day history of flank pain  EXAM: CT ABDOMEN AND PELVIS WITHOUT CONTRAST  TECHNIQUE: Multidetector CT imaging of the abdomen and pelvis was performed following the standard protocol without oral or intravenous contrast material administration.  COMPARISON: None.  FINDINGS: Lower chest: Lung bases are clear.  Hepatobiliary: No focal liver lesions are identified on this noncontrast enhanced study. Gallbladder wall does not appear appreciably thickened. There is no biliary duct dilatation.  Pancreas: No pancreatic mass or inflammatory focus.  Spleen: No splenic lesions are evident.  Adrenals/Urinary Tract: Adrenals appear normal bilaterally. Kidneys bilaterally show no evidence of hydronephrosis or mass on either side. There is no renal or ureteral calculi on either side. Small pelvic phleboliths are close to but felt to be separate from the distal ureters. The urinary bladder is decompressed. Urinary bladder wall does not appear thickened given the degree of bladder decompression.  Stomach/Bowel: There is no bowel wall or mesenteric thickening. No bowel obstruction. No free air or portal venous air.  Vascular/Lymphatic: There is no abdominal aortic aneurysm. No vascular lesions are apparent on this noncontrast enhanced study. There is no demonstrable adenopathy in the abdomen or pelvis.  Reproductive: Uterus is retroverted. There is no pelvic mass or pelvic fluid collection.  Other: Appendix appears normal. There is no abscess or ascites in the abdomen or pelvis.  Musculoskeletal: There are no blastic or lytic bone  lesions. No intramuscular or abdominal wall lesions.  IMPRESSION: A cause for patient's symptoms has not been established with this study.  No renal or ureteral calculus. No hydronephrosis.  Appendix appears normal. No bowel obstruction. No abscess.   Electronically Signed By: Bretta Bang III M.D. On: 02/16/2016 10:15          DG Chest 2 View (Final result) Result time: 02/16/16 09:51:48   Final result by Rad Results In Interface (02/16/16 09:51:48)   Narrative:   CLINICAL DATA: Cough and wheezing for 1 month, initial encounter  EXAM: CHEST 2 VIEW  COMPARISON: 11/20/2015  FINDINGS: The heart size and mediastinal contours are within normal limits. Both lungs are clear. The visualized skeletal structures are unremarkable.  IMPRESSION: No active cardiopulmonary disease.   Electronically Signed By: Alcide Clever M.D. On: 02/16/2016 09:51            I personally reviewed the radiologic studies   P  ED Course:  Patient's stay here was uneventful and I felt she most likely had some acute viral bronchitis with a muscle spasm from coughing.  Not appear to be any significant intra-abdominal sources. Chest x-ray shows no signs of pneumonia. Her headaches seem to be chronic and intermittent and based on her description muscle tension in nature. I felt was unlikely this was meningitis, encephalitis, subarachnoid hemorrhage, etc. The patient's white count likely elevated due to viral etiology. Patient was advised to follow up with her primary physician.  Assessment:  Acute bronchitis Musculoskeletal flank pain   Final Clinical Impression:   Final diagnoses:  Right flank pain     Plan: * Outpatient management Patient was advised to return immediately if condition worsens. Patient was advised to follow up with their primary care physician or other specialized physicians involved in their outpatient care. The patient and/or family  member/power of attorney had laboratory results reviewed at the bedside. All questions and concerns were addressed and appropriate discharge instructions were distributed by the nursing staff.             Jennye MoccasinBrian S Quigley, MD 02/16/16 1434

## 2016-02-16 NOTE — ED Notes (Signed)
Pt has multiple complaints, reports of rt flank pain x 1 week without dysuria, headaches off and on in the mornings x 1 week, pt was placed on zpak by pcp which she has finished and reports her head cold has not improved.

## 2016-02-16 NOTE — ED Notes (Addendum)
Pt complains of right flank worsening since Tuesday, pt reports having migraines and cough /congestion, Pt just completed a Z--pack for a URI

## 2016-02-16 NOTE — ED Notes (Signed)
Pt informed to return if any life threatening symptoms occur.  

## 2016-08-15 ENCOUNTER — Encounter: Payer: Self-pay | Admitting: Emergency Medicine

## 2016-08-15 ENCOUNTER — Emergency Department
Admission: EM | Admit: 2016-08-15 | Discharge: 2016-08-15 | Disposition: A | Discharge status: Home / Self Care | Payer: Medicaid Other | Attending: Student | Admitting: Student

## 2016-08-15 DIAGNOSIS — J45909 Unspecified asthma, uncomplicated: Secondary | ICD-10-CM | POA: Insufficient documentation

## 2016-08-15 DIAGNOSIS — J069 Acute upper respiratory infection, unspecified: Secondary | ICD-10-CM | POA: Diagnosis not present

## 2016-08-15 DIAGNOSIS — B9789 Other viral agents as the cause of diseases classified elsewhere: Secondary | ICD-10-CM

## 2016-08-15 DIAGNOSIS — J029 Acute pharyngitis, unspecified: Secondary | ICD-10-CM | POA: Diagnosis present

## 2016-08-15 LAB — POCT RAPID STREP A: Streptococcus, Group A Screen (Direct): NEGATIVE

## 2016-08-15 MED ORDER — PSEUDOEPH-BROMPHEN-DM 30-2-10 MG/5ML PO SYRP: 5.0000 mL | ORAL_SOLUTION | Freq: Four times a day (QID) | ORAL | 0 refills | Status: DC | PRN

## 2016-08-15 MED ORDER — LIDOCAINE VISCOUS 2 % MT SOLN
15.0000 mL | Freq: Once | OROMUCOSAL | Status: AC
  Administered 2016-08-15: 15 mL via OROMUCOSAL
  Filled 2016-08-15: qty 15

## 2016-08-15 MED ORDER — DIPHENHYDRAMINE HCL 12.5 MG/5ML PO ELIX
25.0000 mg | ORAL_SOLUTION | Freq: Once | ORAL | Status: AC
  Administered 2016-08-15: 25 mg via ORAL
  Filled 2016-08-15: qty 10

## 2016-08-15 MED ORDER — IBUPROFEN 800 MG PO TABS: 800.0000 mg | ORAL_TABLET | Freq: Three times a day (TID) | ORAL | 0 refills | Status: DC | PRN

## 2016-08-15 NOTE — ED Provider Notes (Signed)
[facility] Emergency Department Provider Note   ____________________________________________   None    (approximate)  I have reviewed the triage vital signs and the nursing notes.   HISTORY  Chief Complaint Sore Throat    HPI Kathryn Nash is a 20 y.o. female patient complaining of sore throat for 2 days. Patient also complaining of URI signs symptoms consisting of nasal congestion intermittently runny nose and a nonproductive cough. Patient denies nausea, vomiting, diarrhea with this complaint.Patient also complaining of body aches. Patient rates the pain discomfort 6/10. No palliative measures taken for this complaint.   Past Medical History:  Diagnosis Date  . Asthma     There are no active problems to display for this patient.   History reviewed. No pertinent surgical history.  Prior to Admission medications   Medication Sig Start Date End Date Taking? Authorizing Provider  albuterol (PROVENTIL HFA;VENTOLIN HFA) 108 (90 BASE) MCG/ACT inhaler Inhale 2 puffs into the lungs every 6 (six) hours as needed for wheezing or shortness of breath. 11/20/15   Chinita Pesterari B Triplett, FNP  brompheniramine-pseudoephedrine-DM 30-2-10 MG/5ML syrup Take 5 mLs by mouth 4 (four) times daily as needed. 08/15/16   Joni Reiningonald K Deepti Gunawan, PA-C  ibuprofen (ADVIL,MOTRIN) 800 MG tablet Take 1 tablet (800 mg total) by mouth every 8 (eight) hours as needed for moderate pain. 08/15/16   Joni Reiningonald K Oliviya Gilkison, PA-C  predniSONE (DELTASONE) 20 MG tablet Take 2 tablets (40 mg total) by mouth daily. 02/16/16 02/19/17  Jennye MoccasinBrian S Quigley, MD    Allergies Montelukast sodium and Penicillins  No family history on file.  Social History Social History  Substance Use Topics  . Smoking status: Never Smoker  . Smokeless tobacco: Never Used  . Alcohol use No    Review of Systems Constitutional: No fever/chills Eyes: No visual changes. ENT: Sore throat, nasal congestion, and postnasal  drainage. Cardiovascular: Denies chest pain. Respiratory: Denies shortness of breath. Gastrointestinal: No abdominal pain.  No nausea, no vomiting.  No diarrhea.  No constipation. Genitourinary: Negative for dysuria. Musculoskeletal: Negative for back pain. Skin: Negative for rash. Neurological: Negative for headaches, focal weakness or numbness.   ____________________________________________   PHYSICAL EXAM:  VITAL SIGNS: ED Triage Vitals  Enc Vitals Group     BP 08/15/16 0949 130/75     Pulse Rate 08/15/16 0949 86     Resp 08/15/16 0949 16     Temp 08/15/16 0949 98.3 F (36.8 C)     Temp Source 08/15/16 0949 Oral     SpO2 08/15/16 0949 97 %     Weight 08/15/16 0950 183 lb (83 kg)     Height 08/15/16 0950 5\' 6"  (1.676 m)     Head Circumference --      Peak Flow --      Pain Score 08/15/16 0950 6     Pain Loc --      Pain Edu? --      Excl. in GC? --     Constitutional: Alert and oriented. Well appearing and in no acute distress. Eyes: Conjunctivae are normal. PERRL. EOMI. Head: Atraumatic. Nose: No congestion/rhinnorhea. Mouth/Throat: Mucous membranes are moist.  Oropharynx Erythematous with edematous non-exudative tonsils. Copious postnasal drainage. Neck: No stridor.  No cervical spine tenderness to palpation Hematological/Lymphatic/Immunilogical: No cervical lymphadenopathy. Cardiovascular: Normal rate, regular rhythm. Grossly normal heart sounds.  Good peripheral circulation. Respiratory: Normal respiratory effort.  No retractions. Lungs CTAB. Gastrointestinal: Soft and nontender. No distention. No abdominal bruits. No CVA tenderness.  Musculoskeletal: No lower extremity tenderness nor edema.  No joint effusions. Neurologic:  Normal speech and language. No gross focal neurologic deficits are appreciated. No gait instability. Skin:  Skin is warm, dry and intact. No rash noted. Psychiatric: Mood and affect are normal. Speech and behavior are  normal.  ____________________________________________   LABS (all labs ordered are listed, but only abnormal results are displayed)  Labs Reviewed  CULTURE, GROUP A STREP [facility])  POCT RAPID STREP A   ____________________________________________  EKG   ____________________________________________  RADIOLOGY   ____________________________________________   PROCEDURES  Procedure(s) performed: None  Procedures  Critical Care performed: No  ____________________________________________   INITIAL IMPRESSION / ASSESSMENT AND PLAN / ED COURSE  Pertinent labs & imaging results that were available during my care of the patient were reviewed by me and considered in my medical decision making (see chart for details).  Viral illness. Discussed negative rapid strep test. Patient advised cultures pending. Patient is discharged care instructions. Patient get a prescription for Bromfed-DM and naproxen. Patient advised [facility] condition persists.  Clinical Course     ____________________________________________   FINAL CLINICAL IMPRESSION(S) / ED DIAGNOSES  Final diagnoses:  Viral URI with cough      NEW MEDICATIONS STARTED DURING THIS VISIT:  New Prescriptions   BROMPHENIRAMINE-PSEUDOEPHEDRINE-DM 30-2-10 MG/5ML SYRUP    Take 5 mLs by mouth 4 (four) times daily as needed.   IBUPROFEN (ADVIL,MOTRIN) 800 MG TABLET    Take 1 tablet (800 mg total) by mouth every 8 (eight) hours as needed for moderate pain.     Note:  This document was prepared using Dragon voice recognition software and may include unintentional dictation errors.    Joni Reining, PA-C 08/15/16 1034    Gayla Doss, MD 08/15/16 [phone]

## 2016-08-15 NOTE — ED Triage Notes (Signed)
States she developed a "bad" sore throat" 2 days ago  Then body aches and some chest discomfort last pm

## 2016-08-17 LAB — CULTURE, GROUP A STREP (THRC)

## 2016-11-11 ENCOUNTER — Emergency Department
Admission: EM | Admit: 2016-11-11 | Discharge: 2016-11-11 | Disposition: A | Discharge status: Home / Self Care | Payer: Medicaid Other | Attending: Emergency Medicine | Admitting: Emergency Medicine

## 2016-11-11 ENCOUNTER — Encounter: Payer: Self-pay | Admitting: Emergency Medicine

## 2016-11-11 DIAGNOSIS — Z79899 Other long term (current) drug therapy: Secondary | ICD-10-CM | POA: Diagnosis not present

## 2016-11-11 DIAGNOSIS — R109 Unspecified abdominal pain: Secondary | ICD-10-CM | POA: Diagnosis present

## 2016-11-11 DIAGNOSIS — Z791 Long term (current) use of non-steroidal anti-inflammatories (NSAID): Secondary | ICD-10-CM | POA: Diagnosis not present

## 2016-11-11 DIAGNOSIS — R197 Diarrhea, unspecified: Secondary | ICD-10-CM | POA: Diagnosis not present

## 2016-11-11 DIAGNOSIS — R112 Nausea with vomiting, unspecified: Secondary | ICD-10-CM | POA: Diagnosis not present

## 2016-11-11 DIAGNOSIS — J45909 Unspecified asthma, uncomplicated: Secondary | ICD-10-CM | POA: Diagnosis not present

## 2016-11-11 LAB — COMPREHENSIVE METABOLIC PANEL
ALT: 15 U/L (ref 14–54)
ANION GAP: 9 (ref 5–15)
AST: 19 U/L (ref 15–41)
Albumin: 4.8 g/dL (ref 3.5–5.0)
Alkaline Phosphatase: 78 U/L (ref 38–126)
BUN: 12 mg/dL (ref 6–20)
CHLORIDE: 108 mmol/L (ref 101–111)
CO2: 21 mmol/L — ABNORMAL LOW (ref 22–32)
CREATININE: 0.62 mg/dL (ref 0.44–1.00)
Calcium: 9.6 mg/dL (ref 8.9–10.3)
Glucose, Bld: 107 mg/dL — ABNORMAL HIGH (ref 65–99)
Potassium: 3.9 mmol/L (ref 3.5–5.1)
SODIUM: 138 mmol/L (ref 135–145)
Total Bilirubin: 0.9 mg/dL (ref 0.3–1.2)
Total Protein: 8.8 g/dL — ABNORMAL HIGH (ref 6.5–8.1)

## 2016-11-11 LAB — CBC
HCT: 44.4 % (ref 35.0–47.0)
HEMOGLOBIN: 15.7 g/dL (ref 12.0–16.0)
MCH: 29.9 pg (ref 26.0–34.0)
MCHC: 35.3 g/dL (ref 32.0–36.0)
MCV: 84.6 fL (ref 80.0–100.0)
PLATELETS: 411 10*3/uL (ref 150–440)
RBC: 5.24 MIL/uL — AB (ref 3.80–5.20)
RDW: 12.8 % (ref 11.5–14.5)
WBC: 18 10*3/uL — AB (ref 3.6–11.0)

## 2016-11-11 LAB — URINALYSIS, COMPLETE (UACMP) WITH MICROSCOPIC
BILIRUBIN URINE: NEGATIVE
Bacteria, UA: NONE SEEN
Glucose, UA: NEGATIVE mg/dL
HGB URINE DIPSTICK: NEGATIVE
KETONES UR: NEGATIVE mg/dL
Nitrite: NEGATIVE
PH: 5 (ref 5.0–8.0)
Protein, ur: 30 mg/dL — AB
RBC / HPF: NONE SEEN RBC/hpf (ref 0–5)
SPECIFIC GRAVITY, URINE: 1.027 (ref 1.005–1.030)

## 2016-11-11 LAB — LIPASE, BLOOD: LIPASE: 23 U/L (ref 11–51)

## 2016-11-11 LAB — POCT PREGNANCY, URINE: Preg Test, Ur: NEGATIVE

## 2016-11-11 LAB — PREGNANCY, URINE: Preg Test, Ur: NEGATIVE

## 2016-11-11 MED ORDER — KETOROLAC TROMETHAMINE 30 MG/ML IJ SOLN
30.0000 mg | Freq: Once | INTRAMUSCULAR | Status: AC
  Administered 2016-11-11: 30 mg via INTRAVENOUS
  Filled 2016-11-11: qty 1

## 2016-11-11 MED ORDER — ONDANSETRON 4 MG PO TBDP: 4.0000 mg | ORAL_TABLET | Freq: Three times a day (TID) | ORAL | 0 refills | Status: DC | PRN

## 2016-11-11 MED ORDER — ONDANSETRON HCL 4 MG/2ML IJ SOLN
INTRAMUSCULAR | Status: AC
  Administered 2016-11-11: 4 mg via INTRAVENOUS
  Filled 2016-11-11: qty 2

## 2016-11-11 MED ORDER — SODIUM CHLORIDE 0.9 % IV BOLUS (SEPSIS)
1000.0000 mL | Freq: Once | INTRAVENOUS | Status: AC
  Administered 2016-11-11: 1000 mL via INTRAVENOUS

## 2016-11-11 MED ORDER — ONDANSETRON HCL 4 MG/2ML IJ SOLN
4.0000 mg | Freq: Once | INTRAMUSCULAR | Status: AC
  Administered 2016-11-11: 4 mg via INTRAVENOUS

## 2016-11-11 MED ORDER — ONDANSETRON HCL 4 MG/2ML IJ SOLN
4.0000 mg | Freq: Once | INTRAMUSCULAR | Status: AC
  Administered 2016-11-11: 4 mg via INTRAVENOUS
  Filled 2016-11-11: qty 2

## 2016-11-11 NOTE — ED Notes (Signed)
PO challenge  Completed

## 2016-11-11 NOTE — ED Triage Notes (Signed)
C/O abdominal pain, nausea, vomiting, diarrhea since last night after eating [employer].

## 2016-11-11 NOTE — ED Provider Notes (Addendum)
[facility] Emergency Department Provider Note  ____________________________________________  Time seen: Approximately 2:22 PM  I have reviewed the triage vital signs and the nursing notes.   HISTORY  Chief Complaint Abdominal Pain and Emesis   HPI BRIXTON SCHNAPP is a 20 y.o. female with a history of asthma who presents for evaluation of abdominal pain, nausea, vomiting, diarrhea. Patient reports the symptoms started in the middle of the night. She reports that she had [employer] for dinner last nightand a few hours later started having crampy abdominal pain. She has had multiple episodes of nonbloody nonbilious emesis and watery diarrhea. She is also complaining of intermittent severe sharp and cramping diffuse abdominal pain radiating to her back. No abdominal pain at this time. The abdominal pain usually precedes an episode of vomiting or diarrhea and resolves after that. No fever or chills, no chest pain, no cough, no dysuria or hematuria.  Past Medical History:  Diagnosis Date  . Asthma     There are no active problems to display for this patient.   History reviewed. No pertinent surgical history.  Prior to Admission medications   Medication Sig Start Date End Date Taking? Authorizing Provider  albuterol (PROVENTIL HFA;VENTOLIN HFA) 108 (90 BASE) MCG/ACT inhaler Inhale 2 puffs into the lungs every 6 (six) hours as needed for wheezing or shortness of breath. 11/20/15   Chinita Pester, FNP  brompheniramine-pseudoephedrine-DM 30-2-10 MG/5ML syrup Take 5 mLs by mouth 4 (four) times daily as needed. 08/15/16   Joni Reining, PA-C  ibuprofen (ADVIL,MOTRIN) 800 MG tablet Take 1 tablet (800 mg total) by mouth every 8 (eight) hours as needed for moderate pain. 08/15/16   Joni Reining, PA-C  ondansetron (ZOFRAN ODT) 4 MG disintegrating tablet Take 1 tablet (4 mg total) by mouth every 8 (eight) hours as needed for nausea or vomiting. 11/11/16   Nita Sickle,  MD  predniSONE (DELTASONE) 20 MG tablet Take 2 tablets (40 mg total) by mouth daily. 02/16/16 02/19/17  Jennye Moccasin, MD    Allergies Montelukast sodium and Penicillins  No family history on file.  Social History Social History  Substance Use Topics  . Smoking status: Never Smoker  . Smokeless tobacco: Never Used  . Alcohol use No    Review of Systems  Constitutional: Negative for fever. Eyes: Negative for visual changes. ENT: Negative for sore throat. Neck: No neck pain  Cardiovascular: Negative for chest pain. Respiratory: Negative for shortness of breath. Gastrointestinal: + diffuse cramping abdominal pain, vomiting and diarrhea. Genitourinary: Negative for dysuria. Musculoskeletal: Negative for back pain. Skin: Negative for rash. Neurological: Negative for headaches, weakness or numbness. Psych: No SI or HI  ____________________________________________   PHYSICAL EXAM:  VITAL SIGNS: ED Triage Vitals  Enc Vitals Group     BP 11/11/16 1328 120/67     Pulse Rate 11/11/16 1328 93     Resp 11/11/16 1328 16     Temp 11/11/16 1328 99.1 F (37.3 C)     Temp Source 11/11/16 1328 Oral     SpO2 11/11/16 1328 100 %     Weight 11/11/16 1326 180 lb (81.6 kg)     Height 11/11/16 1326 5\' 6"  (1.676 m)     Head Circumference --      Peak Flow --      Pain Score 11/11/16 1327 10     Pain Loc --      Pain Edu? --      Excl. in GC? --  Constitutional: Alert and oriented. Well appearing and in no apparent distress. HEENT:      Head: Normocephalic and atraumatic.         Eyes: Conjunctivae are normal. Sclera is non-icteric. EOMI. PERRL      Mouth/Throat: Mucous membranes are moist.       Neck: Supple with no signs of meningismus. Cardiovascular: Regular rate and rhythm. No murmurs, gallops, or rubs. 2+ symmetrical distal pulses are present in all extremities. No JVD. Respiratory: Normal respiratory effort. Lungs are clear to auscultation bilaterally. No wheezes,  crackles, or rhonchi.  Gastrointestinal: Soft, non tender, and non distended with positive bowel sounds. No rebound or guarding. Genitourinary: No CVA tenderness. Musculoskeletal: Nontender with normal range of motion in all extremities. No edema, cyanosis, or erythema of extremities. Neurologic: Normal speech and language. Face is symmetric. Moving all extremities. No gross focal neurologic deficits are appreciated. Skin: Skin is warm, dry and intact. No rash noted. Psychiatric: Mood and affect are normal. Speech and behavior are normal.  ____________________________________________   LABS (all labs ordered are listed, but only abnormal results are displayed)  Labs Reviewed  COMPREHENSIVE METABOLIC PANEL - Abnormal; Notable for the following:       Result Value   CO2 21 (*)    Glucose, Bld 107 (*)    Total Protein 8.8 (*)    All other components within normal limits  CBC - Abnormal; Notable for the following:    WBC 18.0 (*)    RBC 5.24 (*)    All other components within normal limits  URINALYSIS, COMPLETE (UACMP) WITH MICROSCOPIC - Abnormal; Notable for the following:    Color, Urine YELLOW (*)    APPearance HAZY (*)    Protein, ur 30 (*)    Leukocytes, UA TRACE (*)    Squamous Epithelial / LPF 0-5 (*)    All other components within normal limits  LIPASE, BLOOD  PREGNANCY, URINE  POC URINE PREG, ED  POCT PREGNANCY, URINE   ____________________________________________  EKG  none  ____________________________________________  RADIOLOGY  none  ____________________________________________   PROCEDURES  Procedure(s) performed: None Procedures Critical Care performed:  None ____________________________________________   INITIAL IMPRESSION / ASSESSMENT AND PLAN / ED COURSE  20 y.o. female with a history of asthma who presents for evaluation of abdominal pain, nausea, vomiting, diarrhea since last night. Patient is extremely well appearing, no distress, vital  signs are within normal limits, abdomen is soft with no tenderness throughout, no signs or symptoms of dehydration. Blood work showing leukocytosis with white count of 18 but otherwise no acute findings. Presentation concerning for viral gastroenteritis versus food poisoning. We'll give IV fluids, IV Zofran, IV Toradol, and reassess. Pregnancy test pending.  Clinical Course as of Nov 11 1837  Wynelle LinkSun Nov 11, 2016  1651 Patient is tolerating by mouth and able to eat and drink without vomiting. Blood work, urine pregnancy, and UA with no acute findings. Patient be discharged home with a prescription for Zofran, supportive care, and close follow-up with primary care doctor.  [CV]  1724 As patient was about to be discharged she had another episode of vomiting and diarrhea. Will give a second round of zofran and reassess  [CV]  1837 Patient successfully passed by mouth challenge. Will be discharged home with Zofran.  [CV]    Clinical Course User Index [CV] Nita Sicklearolina Kyrus Hyde, MD    Pertinent labs & imaging results that were available during my care of the patient were reviewed by me and considered  in my medical decision making (see chart for details).    ____________________________________________   FINAL CLINICAL IMPRESSION(S) / ED DIAGNOSES  Final diagnoses:  Nausea vomiting and diarrhea      NEW MEDICATIONS STARTED DURING THIS VISIT:  New Prescriptions   ONDANSETRON (ZOFRAN ODT) 4 MG DISINTEGRATING TABLET    Take 1 tablet (4 mg total) by mouth every 8 (eight) hours as needed for nausea or vomiting.     Note:  This document was prepared using Dragon voice recognition software and may include unintentional dictation errors.    Nita Sicklearolina Franceen Erisman, MD 11/11/16 1653    Nita Sicklearolina Mariene Dickerman, MD 11/11/16 [phone]

## 2016-11-11 NOTE — ED Notes (Signed)
Pt resting in darkened room, denies nausea currently.

## 2016-11-11 NOTE — ED Notes (Addendum)
PO Challenge complete, and tolerated well.

## 2017-03-02 DIAGNOSIS — J45909 Unspecified asthma, uncomplicated: Secondary | ICD-10-CM

## 2017-03-02 DIAGNOSIS — R0602 Shortness of breath: Secondary | ICD-10-CM | POA: Insufficient documentation

## 2017-03-02 DIAGNOSIS — Z5321 Procedure and treatment not carried out due to patient leaving prior to being seen by health care provider: Secondary | ICD-10-CM | POA: Insufficient documentation

## 2017-03-02 DIAGNOSIS — J301 Allergic rhinitis due to pollen: Secondary | ICD-10-CM | POA: Diagnosis not present

## 2017-03-02 DIAGNOSIS — Z79899 Other long term (current) drug therapy: Secondary | ICD-10-CM | POA: Diagnosis not present

## 2017-03-02 DIAGNOSIS — J069 Acute upper respiratory infection, unspecified: Secondary | ICD-10-CM | POA: Diagnosis not present

## 2017-03-02 DIAGNOSIS — R0981 Nasal congestion: Secondary | ICD-10-CM | POA: Diagnosis present

## 2017-03-02 MED ORDER — ALBUTEROL SULFATE (2.5 MG/3ML) 0.083% IN NEBU
5.0000 mg | INHALATION_SOLUTION | Freq: Once | RESPIRATORY_TRACT | Status: AC
  Administered 2017-03-02: 2.5 mg via RESPIRATORY_TRACT

## 2017-03-02 MED ORDER — ALBUTEROL SULFATE (2.5 MG/3ML) 0.083% IN NEBU
INHALATION_SOLUTION | RESPIRATORY_TRACT | Status: AC
  Filled 2017-03-02: qty 6

## 2017-03-02 NOTE — ED Triage Notes (Signed)
Patient reports being short of breath for "awhile", but becoming worse.  Reports short of breath increases at night with attempting to sleep and exertion.  Patient reports she is out of her inhaler and medicaid will not pay for another yet.

## 2017-03-02 NOTE — ED Notes (Signed)
No answer when called for vital signs 

## 2017-03-03 ENCOUNTER — Emergency Department
Admission: EM | Admit: 2017-03-03 | Discharge: 2017-03-03 | Disposition: A | Discharge status: Home / Self Care | Payer: Medicaid Other | Source: Home / Self Care

## 2017-03-03 ENCOUNTER — Emergency Department
Admission: EM | Admit: 2017-03-03 | Discharge: 2017-03-03 | Disposition: A | Discharge status: Home / Self Care | Payer: Medicaid Other | Attending: Emergency Medicine | Admitting: Emergency Medicine

## 2017-03-03 DIAGNOSIS — J301 Allergic rhinitis due to pollen: Secondary | ICD-10-CM

## 2017-03-03 DIAGNOSIS — J069 Acute upper respiratory infection, unspecified: Secondary | ICD-10-CM | POA: Insufficient documentation

## 2017-03-03 DIAGNOSIS — Z79899 Other long term (current) drug therapy: Secondary | ICD-10-CM | POA: Insufficient documentation

## 2017-03-03 NOTE — ED Notes (Signed)

## 2017-03-03 NOTE — ED Triage Notes (Signed)
C/O non productive cough and sinus congestion x 4 days. Symptoms worse with coughing and laying down at night.

## 2017-03-03 NOTE — ED Triage Notes (Signed)
AAOx3.  Ambulates with easy and steady gait.  Sinus congestion noted.  No SOB/ DOE.  NAD.

## 2017-03-03 NOTE — Discharge Instructions (Signed)
Begin taking allergy medication as prescribed to you by your primary care doctor. You may continue NyQuil as needed for cough at night only. Increase fluids. Follow-up with your primary care doctor if any continued problems.

## 2017-03-03 NOTE — ED Provider Notes (Signed)
[facility] Emergency Department Provider Note  ____________________________________________   First MD Initiated Contact with Patient 03/03/17 1242     (approximate)  I have reviewed the triage vital signs and the nursing notes.   HISTORY  Chief Complaint Cough and Nasal Congestion    HPI Kathryn Nash is a 21 y.o. female is here with complaint of congestion, sneezing, posterior drainage for last 4 days. Patient denies any fever or chills. She denies any ear pain or throat pain. Patient has been taking NyQuil only. Patient states that she was prescribed some allergy medicine by her PCP but has not been taking it because she did not think that this was allergy season. She is a nonsmoker. She denies any shortness of breath. She denies any pain.   Past Medical History:  Diagnosis Date  . Asthma     There are no active problems to display for this patient.   No past surgical history on file.  Prior to Admission medications   Medication Sig Start Date End Date Taking? Authorizing Provider  albuterol (PROVENTIL HFA;VENTOLIN HFA) 108 (90 BASE) MCG/ACT inhaler Inhale 2 puffs into the lungs every 6 (six) hours as needed for wheezing or shortness of breath. 11/20/15   Chinita Pester, FNP    Allergies Eggs or egg-derived products; Montelukast sodium; and Penicillins  No family history on file.  Social History Social History  Substance Use Topics  . Smoking status: Never Smoker  . Smokeless tobacco: Never Used  . Alcohol use No    Review of Systems Constitutional: No fever/chills Eyes: No visual changes. ENT: No sore throat.Positive nasal congestion. Does have sneezing. Cardiovascular: Denies chest pain. Respiratory: Denies shortness of breath. Occasional cough. Gastrointestinal: No abdominal pain.  No nausea, no vomiting.   Musculoskeletal: Negative for back pain. Skin: Negative for rash. Neurological: Negative for headaches, focal weakness  or numbness.  10-point ROS otherwise negative.  ____________________________________________   PHYSICAL EXAM:  VITAL SIGNS: ED Triage Vitals [03/03/17 1123]  Enc Vitals Group     BP 117/80     Pulse Rate 100     Resp 16     Temp 98.1 F (36.7 C)     Temp Source Oral     SpO2 96 %     Weight 185 lb (83.9 kg)     Height  (1.676 m)     Head Circumference      Peak Flow      Pain Score      Pain Loc      Pain Edu?      Excl. in GC?     Constitutional: Alert and oriented. Well appearing and in no acute distress. Eyes: Conjunctivae are normal. PERRL. EOMI. Head: Atraumatic. Nose: Moderate congestion/rhinnorhea.  EACs are clear bilaterally. TMs are dull without erythema or injection. Mouth/Throat: Mucous membranes are moist.  Oropharynx non-erythematous. Nasal mucosa is pale. Neck: No stridor.   Hematological/Lymphatic/Immunilogical: No cervical lymphadenopathy. Cardiovascular: Normal rate, regular rhythm. Grossly normal heart sounds.  Good peripheral circulation. Respiratory: Normal respiratory effort.  No retractions. Lungs CTAB. Musculoskeletal: No lower extremity tenderness nor edema.  No joint effusions. Neurologic:  Normal speech and language. No gross focal neurologic deficits are appreciated. No gait instability. Skin:  Skin is warm, dry and intact. No rash noted. Psychiatric: Mood and affect are normal. Speech and behavior are normal.  ____________________________________________   LABS (all labs ordered are listed, but only abnormal results are displayed)  Labs Reviewed -  No data to display  PROCEDURES  Procedure(s) performed: None  Procedures  Critical Care performed: No  ____________________________________________   INITIAL IMPRESSION / ASSESSMENT AND PLAN / ED COURSE  Pertinent labs & imaging results that were available during my care of the patient were reviewed by me and considered in my medical decision making (see chart for  details).  Patient is to start on her allergy medicine as prescribed to her by her doctor. May continue her NyQuil at home as needed for cough and congestion. She is to increase fluids. She is encouraged to follow-up with her PCP if any continued problems.      ____________________________________________   FINAL CLINICAL IMPRESSION(S) / ED DIAGNOSES  Final diagnoses:  Acute seasonal allergic rhinitis due to pollen  Acute upper respiratory infection      NEW MEDICATIONS STARTED DURING THIS VISIT:  Current Discharge Medication List       Note:  This document was prepared using Dragon voice recognition software and may include unintentional dictation errors.    Tommi Rumps, PA-C 03/03/17 1301    Tommi Rumps, PA-C 03/03/17 1303    Sharyn Creamer, MD 03/03/17 [phone]

## 2017-09-01 ENCOUNTER — Emergency Department: Admission: EM | Admit: 2017-09-01 | Discharge: 2017-09-01 | Discharge status: Against Medical Advice | Payer: Self-pay

## 2017-09-01 NOTE — ED Triage Notes (Signed)
Pt states she does not want to wait and is choosing to leave.

## 2017-12-15 ENCOUNTER — Other Ambulatory Visit: Payer: Self-pay

## 2017-12-15 ENCOUNTER — Emergency Department
Admission: EM | Admit: 2017-12-15 | Discharge: 2017-12-15 | Disposition: A | Discharge status: Home / Self Care | Payer: Self-pay | Attending: Emergency Medicine | Admitting: Emergency Medicine

## 2017-12-15 ENCOUNTER — Encounter: Payer: Self-pay | Admitting: Emergency Medicine

## 2017-12-15 ENCOUNTER — Emergency Department: Payer: Self-pay

## 2017-12-15 DIAGNOSIS — J45909 Unspecified asthma, uncomplicated: Secondary | ICD-10-CM

## 2017-12-15 DIAGNOSIS — J40 Bronchitis, not specified as acute or chronic: Secondary | ICD-10-CM | POA: Insufficient documentation

## 2017-12-15 MED ORDER — PREDNISONE 10 MG PO TABS: ORAL_TABLET | ORAL | 0 refills | Status: DC

## 2017-12-15 MED ORDER — METHYLPREDNISOLONE SODIUM SUCC 125 MG IJ SOLR
125.0000 mg | Freq: Once | INTRAMUSCULAR | Status: AC
  Administered 2017-12-15: 125 mg via INTRAMUSCULAR
  Filled 2017-12-15: qty 2

## 2017-12-15 MED ORDER — AZITHROMYCIN 250 MG PO TABS: ORAL_TABLET | ORAL | 0 refills | Status: DC

## 2017-12-15 MED ORDER — IPRATROPIUM-ALBUTEROL 0.5-2.5 (3) MG/3ML IN SOLN
3.0000 mL | Freq: Once | RESPIRATORY_TRACT | Status: AC
  Administered 2017-12-15: 3 mL via RESPIRATORY_TRACT
  Filled 2017-12-15: qty 3

## 2017-12-15 MED ORDER — ALBUTEROL SULFATE HFA 108 (90 BASE) MCG/ACT IN AERS: 2.0000 | INHALATION_SPRAY | Freq: Four times a day (QID) | RESPIRATORY_TRACT | 0 refills | Status: DC | PRN

## 2017-12-15 MED ORDER — ALBUTEROL SULFATE (2.5 MG/3ML) 0.083% IN NEBU: 2.5000 mg | INHALATION_SOLUTION | Freq: Four times a day (QID) | RESPIRATORY_TRACT | 12 refills | Status: DC | PRN

## 2017-12-15 NOTE — ED Triage Notes (Signed)
Arrives with C/O wheezing x 2-3 months.  Has been seen through Urgent Care, had been treated for Bronchitis, but symptoms continue.  Patient has been using nebulizer and inhaler.  No SOB observed.  Speaking in full sentences.  Skin warm and dry.

## 2017-12-15 NOTE — ED Provider Notes (Signed)
[facility] Emergency Department Provider Note  ____________________________________________  Time seen: Approximately 9:40 AM  I have reviewed the triage vital signs and the nursing notes.   HISTORY  Chief Complaint Wheezing    HPI Kathryn Nash is a 22 y.o. female with PMH asthma that presents to the emergency department for evaluation of sore throat, nasal congestion and nonproductive cough for 3 months.  Patient was seen by primary care doctor in December and was told that it was her allergies.  She went to urgent care at the beginning of January and received a prescription of Bactrim for bronchitis.  She has an albuterol inhaler and nebulizer for asthma but both are expired.  No fever, chills, shortness of breath, chest pain, nausea, vomiting, abdominal pain.  Past Medical History:  Diagnosis Date  . Asthma     There are no active problems to display for this patient.   History reviewed. No pertinent surgical history.  Prior to Admission medications   Medication Sig Start Date End Date Taking? Authorizing Provider  albuterol (PROVENTIL HFA;VENTOLIN HFA) 108 (90 Base) MCG/ACT inhaler Inhale 2 puffs into the lungs every 6 (six) hours as needed for wheezing or shortness of breath. 12/15/17   Enid Derry, PA-C  albuterol (PROVENTIL) (2.5 MG/3ML) 0.083% nebulizer solution Take 3 mLs (2.5 mg total) by nebulization every 6 (six) hours as needed for wheezing or shortness of breath. 12/15/17   Enid Derry, PA-C  azithromycin (ZITHROMAX Z-PAK) 250 MG tablet Take 2 tablets (500 mg) on  Day 1,  followed by 1 tablet (250 mg) once daily on Days 2 through 5. 12/15/17   Enid Derry, PA-C  predniSONE (DELTASONE) 10 MG tablet Take 6 tablets on day 1, take 5 tablets on day 2, take 4 tablets on day 3, take 3 tablets on day 4, take 2 tablets on day 5, take 1 tablet on day 6 12/16/17   Enid Derry, PA-C    Allergies Eggs or egg-derived products; Montelukast sodium;  and Penicillins  No family history on file.  Social History Social History   Tobacco Use  . Smoking status: Never Smoker  . Smokeless tobacco: Never Used  Substance Use Topics  . Alcohol use: No  . Drug use: No     Review of Systems  Constitutional: No fever/chills ENT: Positive for congestion and rhinorrhea. Cardiovascular: No chest pain. Respiratory: Positive for cough. No SOB. Gastrointestinal: No abdominal pain.  No nausea, no vomiting.   Musculoskeletal: Negative for musculoskeletal pain. Skin: Negative for rash, abrasions, lacerations, ecchymosis. Neurological: Negative for headaches.   ____________________________________________   PHYSICAL EXAM:  VITAL SIGNS: ED Triage Vitals [12/15/17 0844]  Enc Vitals Group     BP 123/82     Pulse Rate 84     Resp 18     Temp 98.1 F (36.7 C)     Temp Source Oral     SpO2 99 %     Weight 200 lb (90.7 kg)     Height 5\' 5"  (1.651 m)     Head Circumference      Peak Flow      Pain Score      Pain Loc      Pain Edu?      Excl. in GC?      Constitutional: Alert and oriented. Well appearing and in no acute distress. Eyes: Conjunctivae are normal. PERRL. EOMI. No discharge. Head: Atraumatic. ENT: No frontal and maxillary sinus tenderness.  Ears: Tympanic membranes pearly gray with good landmarks. No discharge.      Nose: Mild congestion/rhinnorhea.      Mouth/Throat: Mucous membranes are moist. Oropharynx non-erythematous. Tonsils mildly enlarged. No exudates. Uvula midline. Neck: No stridor.   Hematological/Lymphatic/Immunilogical: No cervical lymphadenopathy. Cardiovascular: Normal rate, regular rhythm.  Good peripheral circulation. Respiratory: Normal respiratory effort without tachypnea or retractions. Scattered wheezes on auscultation. Good air entry to the bases with no decreased or absent breath sounds. Gastrointestinal: Bowel sounds 4 quadrants. Soft and nontender to palpation. No guarding or rigidity.  No palpable masses. No distention. Musculoskeletal: Full range of motion to all extremities. No gross deformities appreciated. Neurologic:  Normal speech and language. No gross focal neurologic deficits are appreciated.  Skin:  Skin is warm, dry and intact. No rash noted.   ____________________________________________   LABS (all labs ordered are listed, but only abnormal results are displayed)  Labs Reviewed  GROUP A STREP BY PCR   ____________________________________________  EKG   ____________________________________________  RADIOLOGY Lexine BatonI, Estiven Kohan, personally viewed and evaluated these images (plain radiographs) as part of my medical decision making, as well as reviewing the written report by the radiologist.  Dg Chest 2 View  Result Date: 12/15/2017 CLINICAL DATA:  Encounter for C/O wheezing x 2-3 months. Has been seen through Urgent Care, had been treated for Bronchitis, but symptoms continue. Patient has been using nebulizer and inhaler. Non smoker, hx of asthma EXAM: CHEST  2 VIEW COMPARISON:  02/16/2016 FINDINGS: Midline trachea. Normal heart size and mediastinal contours. No pleural effusion or pneumothorax. Diffuse peribronchial thickening. Clear lungs. IMPRESSION: No acute cardiopulmonary disease. Mild interstitial thickening, likely related to the clinical history of asthma. Electronically Signed   By: Jeronimo GreavesKyle  Talbot M.D.   On: 12/15/2017 09:12    ____________________________________________    PROCEDURES  Procedure(s) performed:    Procedures    Medications  ipratropium-albuterol (DUONEB) 0.5-2.5 (3) MG/3ML nebulizer solution 3 mL (3 mLs Nebulization Given 12/15/17 0915)  methylPREDNISolone sodium succinate (SOLU-MEDROL) 125 mg/2 mL injection 125 mg (125 mg Intramuscular Given 12/15/17 1100)     ____________________________________________   INITIAL IMPRESSION / ASSESSMENT AND PLAN / ED COURSE  Pertinent labs & imaging results that were available  during my care of the patient were reviewed by me and considered in my medical decision making (see chart for details).  Review of the [place] CSRS was performed in accordance of the [facility] prior to dispensing any controlled drugs.    Patient's diagnosis is consistent with bronchitis and asthma. Vital signs and exam are reassuring.  Chest x-ray consistent with asthmatic changes.  Wheezing cleared after DuoNeb.  Patient appears well and is staying well hydrated. Patient feels comfortable going home. She was given a does of solumedrol. Patient will be discharged home with prescriptions for azithromycin, prednisone, albuterol inhaler, albuterol nebulizer PCP. Patient is to follow up with PCP as needed or otherwise directed. Patient is given ED precautions to return to the ED for any worsening or new symptoms.     ____________________________________________  FINAL CLINICAL IMPRESSION(S) / ED DIAGNOSES  Final diagnoses:  Bronchitis  Asthma, unspecified asthma severity, unspecified whether complicated, unspecified whether persistent      NEW MEDICATIONS STARTED DURING THIS VISIT:  ED Discharge Orders        Ordered    predniSONE (DELTASONE) 10 MG tablet     12/15/17 1056    azithromycin (ZITHROMAX Z-PAK) 250 MG tablet     12/15/17 1056    albuterol (PROVENTIL) (2.5  MG/3ML) 0.083% nebulizer solution  Every 6 hours PRN     12/15/17 1056    albuterol (PROVENTIL HFA;VENTOLIN HFA) 108 (90 Base) MCG/ACT inhaler  Every 6 hours PRN     12/15/17 1056          This chart was dictated using voice recognition software/Dragon. Despite best efforts to proofread, errors can occur which can change the meaning. Any change was purely unintentional.    Enid Derry, PA-C 12/15/17 1158    Dionne Bucy, MD 12/15/17 [phone]

## 2017-12-15 NOTE — ED Notes (Signed)
Waiting 20 min after injection for discharge

## 2018-01-28 ENCOUNTER — Encounter: Payer: Self-pay | Admitting: Emergency Medicine

## 2018-01-28 ENCOUNTER — Emergency Department
Admission: EM | Admit: 2018-01-28 | Discharge: 2018-01-28 | Disposition: A | Discharge status: Home / Self Care | Payer: Self-pay | Attending: Emergency Medicine | Admitting: Emergency Medicine

## 2018-01-28 DIAGNOSIS — J45909 Unspecified asthma, uncomplicated: Secondary | ICD-10-CM | POA: Insufficient documentation

## 2018-01-28 DIAGNOSIS — J111 Influenza due to unidentified influenza virus with other respiratory manifestations: Secondary | ICD-10-CM | POA: Insufficient documentation

## 2018-01-28 MED ORDER — PSEUDOEPH-BROMPHEN-DM 30-2-10 MG/5ML PO SYRP: 5.0000 mL | ORAL_SOLUTION | Freq: Four times a day (QID) | ORAL | 0 refills | Status: DC | PRN

## 2018-01-28 MED ORDER — OSELTAMIVIR PHOSPHATE 75 MG PO CAPS: 75.0000 mg | ORAL_CAPSULE | Freq: Two times a day (BID) | ORAL | 0 refills | Status: AC

## 2018-01-28 NOTE — ED Notes (Signed)
See triage note   Presents with subjective fever,runny nose,cough and body aches which started yesterday  Afebrile on arrival

## 2018-01-28 NOTE — ED Triage Notes (Signed)
Pt with flu like sx.  

## 2018-01-28 NOTE — ED Provider Notes (Signed)
[facility] Emergency Department Provider Note  ____________________________________________  Time seen: Approximately 9:14 AM  I have reviewed the triage vital signs and the nursing notes.   HISTORY  Chief Complaint Generalized Body Aches    HPI Kathryn Nash is a 22 y.o. female that presents to the emergency department for evaluation of fever, chills, headache, body aches, nasal congestion, sore throat, nonproductive cough since yesterday.  She has not checked her temperature but has felt warm.  Patient states that everyone in her family has tested positive for influenza.  Patient is here with her sister also has flulike symptoms.  She does not smoke.  No SOB, CP, nausea, vomiting, abdominal pain, diarrhea, constipation.  Past Medical History:  Diagnosis Date  . Asthma     There are no active problems to display for this patient.   History reviewed. No pertinent surgical history.  Prior to Admission medications   Medication Sig Start Date End Date Taking? Authorizing Provider  albuterol (PROVENTIL HFA;VENTOLIN HFA) 108 (90 Base) MCG/ACT inhaler Inhale 2 puffs into the lungs every 6 (six) hours as needed for wheezing or shortness of breath. 12/15/17   Enid Derry, PA-C  albuterol (PROVENTIL) (2.5 MG/3ML) 0.083% nebulizer solution Take 3 mLs (2.5 mg total) by nebulization every 6 (six) hours as needed for wheezing or shortness of breath. 12/15/17   Enid Derry, PA-C  azithromycin (ZITHROMAX Z-PAK) 250 MG tablet Take 2 tablets (500 mg) on  Day 1,  followed by 1 tablet (250 mg) once daily on Days 2 through 5. 12/15/17   Enid Derry, PA-C  brompheniramine-pseudoephedrine-DM 30-2-10 MG/5ML syrup Take 5 mLs by mouth 4 (four) times daily as needed. 01/28/18   Enid Derry, PA-C  oseltamivir (TAMIFLU) 75 MG capsule Take 1 capsule (75 mg total) by mouth 2 (two) times daily for 5 days. 01/28/18 02/02/18  Enid Derry, PA-C  predniSONE (DELTASONE) 10 MG tablet  Take 6 tablets on day 1, take 5 tablets on day 2, take 4 tablets on day 3, take 3 tablets on day 4, take 2 tablets on day 5, take 1 tablet on day 6 12/16/17   Enid Derry, PA-C    Allergies Eggs or egg-derived products; Montelukast sodium; and Penicillins  No family history on file.  Social History Social History   Tobacco Use  . Smoking status: Never Smoker  . Smokeless tobacco: Never Used  Substance Use Topics  . Alcohol use: No  . Drug use: No     Review of Systems  Eyes: No visual changes. No discharge. ENT: Positive for congestion and rhinorrhea. Cardiovascular: No chest pain. Respiratory: Positive for cough. No SOB. Gastrointestinal: No abdominal pain.  No nausea, no vomiting.  No diarrhea.  No constipation. Musculoskeletal: Positive for body aches. Skin: Negative for rash, abrasions, lacerations, ecchymosis. Neurological: Positive for headaches.   ____________________________________________   PHYSICAL EXAM:  VITAL SIGNS: ED Triage Vitals  Enc Vitals Group     BP 01/28/18 0837 111/69     Pulse Rate 01/28/18 0837 96     Resp 01/28/18 0837 18     Temp 01/28/18 0837 98 F (36.7 C)     Temp Source 01/28/18 0837 Oral     SpO2 01/28/18 0837 95 %     Weight 01/28/18 0814 200 lb (90.7 kg)     Height --      Head Circumference --      Peak Flow --      Pain Score --  Pain Loc --      Pain Edu? --      Excl. in GC? --      Constitutional: Alert and oriented. Well appearing and in no acute distress. Eyes: Conjunctivae are normal. PERRL. EOMI. No discharge. Head: Atraumatic. ENT: No frontal and maxillary sinus tenderness.      Ears: Tympanic membranes pearly gray with good landmarks. No discharge.      Nose: Mild congestion/rhinnorhea.      Mouth/Throat: Mucous membranes are moist. Oropharynx non-erythematous. Tonsils not enlarged. No exudates. Uvula midline. Neck: No stridor.   Hematological/Lymphatic/Immunilogical: No cervical  lymphadenopathy. Cardiovascular: Normal rate, regular rhythm.  Good peripheral circulation. Respiratory: Normal respiratory effort without tachypnea or retractions. Lungs CTAB. Good air entry to the bases with no decreased or absent breath sounds. Gastrointestinal: Bowel sounds 4 quadrants. Soft and nontender to palpation. No guarding or rigidity. No palpable masses. No distention. Musculoskeletal: Full range of motion to all extremities. No gross deformities appreciated. Neurologic:  Normal speech and language. No gross focal neurologic deficits are appreciated.  Skin:  Skin is warm, dry and intact. No rash noted.   ____________________________________________   LABS (all labs ordered are listed, but only abnormal results are displayed)  Labs Reviewed - No data to display ____________________________________________  EKG   ____________________________________________  RADIOLOGY  No results found.  ____________________________________________    PROCEDURES  Procedure(s) performed:    Procedures    Medications - No data to display   ____________________________________________   INITIAL IMPRESSION / ASSESSMENT AND PLAN / ED COURSE  Pertinent labs & imaging results that were available during my care of the patient were reviewed by me and considered in my medical decision making (see chart for details).  Review of the [place] CSRS was performed in accordance of the [facility] prior to dispensing any controlled drugs.   Patient's diagnosis is consistent with influenza-like illness. Vital signs and exam are reassuring.  Patient would not like to be tested for influenza and would like to be treated based on symptoms and on sick contacts.  Patient appears well and is staying well hydrated. Patient should alternate tylenol and ibuprofen for fever. Patient feels comfortable going home. Patient will be discharged home with prescriptions for Tamiflu and Bromfed. Patient is to follow  up with PCP as needed or otherwise directed. Patient is given ED precautions to return to the ED for any worsening or new symptoms.     ____________________________________________  FINAL CLINICAL IMPRESSION(S) / ED DIAGNOSES  Final diagnoses:  Influenza      NEW MEDICATIONS STARTED DURING THIS VISIT:  ED Discharge Orders        Ordered    oseltamivir (TAMIFLU) 75 MG capsule  2 times daily     01/28/18 0924    brompheniramine-pseudoephedrine-DM 30-2-10 MG/5ML syrup  4 times daily PRN     01/28/18 0924          This chart was dictated using voice recognition software/Dragon. Despite best efforts to proofread, errors can occur which can change the meaning. Any change was purely unintentional.    Enid DerryWagner, Conner Neiss, PA-C 01/28/18 1241    Rockne MenghiniNorman, Anne-Caroline, MD 01/28/18 [phone]

## 2018-02-14 ENCOUNTER — Other Ambulatory Visit: Payer: Self-pay

## 2018-02-14 ENCOUNTER — Emergency Department
Admission: EM | Admit: 2018-02-14 | Discharge: 2018-02-14 | Disposition: A | Discharge status: Home / Self Care | Payer: Self-pay | Attending: Emergency Medicine | Admitting: Emergency Medicine

## 2018-02-14 ENCOUNTER — Encounter: Payer: Self-pay | Admitting: Emergency Medicine

## 2018-02-14 DIAGNOSIS — Z23 Encounter for immunization: Secondary | ICD-10-CM | POA: Insufficient documentation

## 2018-02-14 DIAGNOSIS — Y939 Activity, unspecified: Secondary | ICD-10-CM | POA: Insufficient documentation

## 2018-02-14 DIAGNOSIS — J45909 Unspecified asthma, uncomplicated: Secondary | ICD-10-CM | POA: Insufficient documentation

## 2018-02-14 DIAGNOSIS — S51812A Laceration without foreign body of left forearm, initial encounter: Secondary | ICD-10-CM | POA: Insufficient documentation

## 2018-02-14 DIAGNOSIS — Y929 Unspecified place or not applicable: Secondary | ICD-10-CM | POA: Insufficient documentation

## 2018-02-14 DIAGNOSIS — Y990 Civilian activity done for income or pay: Secondary | ICD-10-CM | POA: Insufficient documentation

## 2018-02-14 DIAGNOSIS — W260XXA Contact with knife, initial encounter: Secondary | ICD-10-CM | POA: Insufficient documentation

## 2018-02-14 MED ORDER — LIDOCAINE HCL (PF) 1 % IJ SOLN
5.0000 mL | Freq: Once | INTRAMUSCULAR | Status: AC
  Administered 2018-02-14: 5 mL via INTRADERMAL
  Filled 2018-02-14: qty 5

## 2018-02-14 MED ORDER — TETANUS-DIPHTH-ACELL PERTUSSIS 5-2.5-18.5 LF-MCG/0.5 IM SUSP
0.5000 mL | Freq: Once | INTRAMUSCULAR | Status: AC
  Administered 2018-02-14: 0.5 mL via INTRAMUSCULAR
  Filled 2018-02-14: qty 0.5

## 2018-02-14 MED ORDER — BACITRACIN ZINC 500 UNIT/GM EX OINT: TOPICAL_OINTMENT | Freq: Once | CUTANEOUS | Status: DC

## 2018-02-14 NOTE — Discharge Instructions (Signed)
Follow-up with the ER in 1 week for suture removal.  Keep the area clean and dry at work.  You can shower and wash the area with soap and water.  Try to keep it as dry as possible while at home.  Keep the area covered while at work.  You can let it open to the air when you are at home.  If there are any signs of redness, pus, swelling or increased pain please return to the emergency department

## 2018-02-14 NOTE — ED Provider Notes (Signed)
[facility] Emergency Department Provider Note  ____________________________________________   First MD Initiated Contact with Patient 02/14/18 1624     (approximate)  I have reviewed the triage vital signs and the nursing notes.   HISTORY  Chief Complaint Extremity Laceration    HPI Kathryn Nash is a 22 y.o. female presents emergency department after cutting her arm at work on a box cutter.  She denies any other injuries.  She denies any loss of motion of the hand and wrist.  She is unsure of her last tetanus  Past Medical History:  Diagnosis Date  . Asthma     There are no active problems to display for this patient.   History reviewed. No pertinent surgical history.  Prior to Admission medications   Medication Sig Start Date End Date Taking? Authorizing Provider  albuterol (PROVENTIL HFA;VENTOLIN HFA) 108 (90 Base) MCG/ACT inhaler Inhale 2 puffs into the lungs every 6 (six) hours as needed for wheezing or shortness of breath. 12/15/17   Enid Derry, PA-C  albuterol (PROVENTIL) (2.5 MG/3ML) 0.083% nebulizer solution Take 3 mLs (2.5 mg total) by nebulization every 6 (six) hours as needed for wheezing or shortness of breath. 12/15/17   Enid Derry, PA-C    Allergies Eggs or egg-derived products; Montelukast sodium; and Penicillins  No family history on file.  Social History Social History   Tobacco Use  . Smoking status: Never Smoker  . Smokeless tobacco: Never Used  Substance Use Topics  . Alcohol use: No  . Drug use: No    Review of Systems  Constitutional: No fever/chills Eyes: No visual changes. ENT: No sore throat. Respiratory: Denies cough Genitourinary: Negative for dysuria. Musculoskeletal: Negative for back pain. Skin: Negative for rash.  Positive for laceration to the left forearm    ____________________________________________   PHYSICAL EXAM:  VITAL SIGNS: ED Triage Vitals [02/14/18 1604]  Enc Vitals  Group     BP 125/75     Pulse Rate 62     Resp 16     Temp 98 F (36.7 C)     Temp Source Oral     SpO2 93 %     Weight      Height      Head Circumference      Peak Flow      Pain Score 3     Pain Loc      Pain Edu?      Excl. in GC?     Constitutional: Alert and oriented. Well appearing and in no acute distress. Eyes: Conjunctivae are normal.  Head: Atraumatic. Nose: No congestion/rhinnorhea. Mouth/Throat: Mucous membranes are moist.   Cardiovascular: Normal rate, regular rhythm. Respiratory: Normal respiratory effort.  No retractions GU: deferred Musculoskeletal: FROM all extremities, warm and well perfused Neurologic:  Normal speech and language.  Skin:  Skin is warm, dry.  The left forearm has a 2 cm laceration. Psychiatric: Mood and affect are normal. Speech and behavior are normal.  ____________________________________________   LABS (all labs ordered are listed, but only abnormal results are displayed)  Labs Reviewed - No data to display ____________________________________________   ____________________________________________  RADIOLOGY    ____________________________________________   PROCEDURES  Procedure(s) performed:   Marland KitchenMarland KitchenLaceration Repair Date/Time: 02/14/2018 4:48 PM Performed by: Faythe Ghee, PA-C Authorized by: Faythe Ghee, PA-C   Consent:    Consent obtained:  Verbal   Consent given by:  Patient   Risks discussed:  Infection, pain, poor cosmetic result and  poor wound healing   Alternatives discussed:  No treatment Anesthesia (see MAR for exact dosages):    Anesthesia method:  Local infiltration   Local anesthetic:  Lidocaine 1% w/o epi Laceration details:    Location:  Shoulder/arm   Shoulder/arm location:  L lower arm   Length (cm):  2   Depth (mm):  1.5 Repair type:    Repair type:  Simple Pre-procedure details:    Preparation:  Patient was prepped and draped in usual sterile fashion Exploration:    Hemostasis  achieved with:  Direct pressure   Wound exploration: wound explored through full range of motion     Wound extent: no foreign bodies/material noted, no muscle damage noted, no nerve damage noted, no tendon damage noted, no underlying fracture noted and no vascular damage noted     Contaminated: no   Treatment:    Area cleansed with:  Betadine and saline   Amount of cleaning:  Standard   Irrigation solution:  Sterile saline   Irrigation method:  Syringe   Visualized foreign bodies/material removed: no   Skin repair:    Repair method:  Sutures   Suture size:  5-0   Suture material:  Nylon   Number of sutures:  4 Approximation:    Approximation:  Close Post-procedure details:    Dressing:  Antibiotic ointment   Patient tolerance of procedure:  Tolerated well, no immediate complications      ____________________________________________   INITIAL IMPRESSION / ASSESSMENT AND PLAN / ED COURSE  Pertinent labs & imaging results that were available during my care of the patient were reviewed by me and considered in my medical decision making (see chart for details).  Patient is 22 year old female presents emergency department with a laceration to the left forearm from a box cutter.  She cut this while at work.  On physical exam the left forearm has a 2 cm laceration.  No foreign body is noted  The area was sutured with 5-0 nylon, 4 sutures were placed  The patient was given care instructions for the wound.  She is to return to the emergency department in 7 days for suture removal.  She is to keep the area clean and dry while at work.  She can wash with soap and water while at home.  She should allow the air to get to the wound if it is not draining while at home.  If she sees any signs of infection she is to return to the emergency department.  The patient states she understands.  She was given a tetanus while in the ED.  She was discharged in stable condition with Worker's Comp. papers  filled out     As part of my medical decision making, I reviewed the following data within the electronic MEDICAL RECORD NUMBER Nursing notes reviewed and incorporated, Notes from prior ED visits and [place] Controlled Substance Database  ____________________________________________   FINAL CLINICAL IMPRESSION(S) / ED DIAGNOSES  Final diagnoses:  Laceration of left forearm, initial encounter      NEW MEDICATIONS STARTED DURING THIS VISIT:  Current Discharge Medication List       Note:  This document was prepared using Dragon voice recognition software and may include unintentional dictation errors.    Faythe GheeFisher, Susan W, PA-C 02/14/18 1652    Sharman CheekStafford, Phillip, MD 02/15/18 [phone]

## 2018-02-14 NOTE — ED Notes (Signed)
Pt refused to have dressing applied - she states that she had rather leave the area open to air and that if it needs dressed her mother is a Engineer, civil (consulting)nurse and she will have her to dress it

## 2018-02-14 NOTE — ED Triage Notes (Signed)
Pt to ED from work for right arm laceration. Bleeding is controlled at this time.

## 2018-02-14 NOTE — ED Notes (Signed)
See triage note  Presents with laceration to left forearm   States she was opening boxes with a box cutter  The boxes fell  approx 1 inch laceration noted to forearm  Bleeding controlled

## 2018-02-25 ENCOUNTER — Encounter: Payer: Self-pay | Admitting: Emergency Medicine

## 2018-02-25 ENCOUNTER — Emergency Department
Admission: EM | Admit: 2018-02-25 | Discharge: 2018-02-25 | Disposition: A | Discharge status: Home / Self Care | Payer: Self-pay | Attending: Emergency Medicine | Admitting: Emergency Medicine

## 2018-02-25 ENCOUNTER — Other Ambulatory Visit: Payer: Self-pay

## 2018-02-25 DIAGNOSIS — N760 Acute vaginitis: Secondary | ICD-10-CM | POA: Insufficient documentation

## 2018-02-25 DIAGNOSIS — J45909 Unspecified asthma, uncomplicated: Secondary | ICD-10-CM | POA: Insufficient documentation

## 2018-02-25 DIAGNOSIS — Z202 Contact with and (suspected) exposure to infections with a predominantly sexual mode of transmission: Secondary | ICD-10-CM

## 2018-02-25 DIAGNOSIS — B9689 Other specified bacterial agents as the cause of diseases classified elsewhere: Secondary | ICD-10-CM

## 2018-02-25 LAB — WET PREP, GENITAL
SPERM: NONE SEEN
Trich, Wet Prep: NONE SEEN
YEAST WET PREP: NONE SEEN

## 2018-02-25 LAB — CHLAMYDIA/NGC RT PCR (ARMC ONLY)
Chlamydia Tr: NOT DETECTED
N GONORRHOEAE: NOT DETECTED

## 2018-02-25 LAB — URINALYSIS, COMPLETE (UACMP) WITH MICROSCOPIC
Bacteria, UA: NONE SEEN
Bilirubin Urine: NEGATIVE
GLUCOSE, UA: NEGATIVE mg/dL
HGB URINE DIPSTICK: NEGATIVE
KETONES UR: NEGATIVE mg/dL
Leukocytes, UA: NEGATIVE
NITRITE: NEGATIVE
PH: 6 (ref 5.0–8.0)
Protein, ur: NEGATIVE mg/dL
Specific Gravity, Urine: 1.023 (ref 1.005–1.030)

## 2018-02-25 LAB — POCT PREGNANCY, URINE: Preg Test, Ur: NEGATIVE

## 2018-02-25 MED ORDER — METRONIDAZOLE 500 MG PO TABS: 500.0000 mg | ORAL_TABLET | Freq: Two times a day (BID) | ORAL | 0 refills | Status: AC

## 2018-02-25 NOTE — ED Provider Notes (Signed)
[facility] Emergency Department Provider Note ____________________________________________  Time seen: 1156  I have reviewed the triage vital signs and the nursing notes.  HISTORY  Chief Complaint  Exposure to STD  HPI Kathryn Nash is a 22 y.o. female presents herself to the ED for routine STD exposure testing.  Patient denies any current complaints.  She does note that she is expecting her period any day now.  She initially reported to the local health department for routine testing, but noted a long wait. She reports she goes there regularly for interim testing.   Past Medical History:  Diagnosis Date  . Asthma     There are no active problems to display for this patient.   History reviewed. No pertinent surgical history.  Prior to Admission medications   Medication Sig Start Date End Date Taking? Authorizing Provider  albuterol (PROVENTIL HFA;VENTOLIN HFA) 108 (90 Base) MCG/ACT inhaler Inhale 2 puffs into the lungs every 6 (six) hours as needed for wheezing or shortness of breath. 12/15/17   Enid DerryWagner, Ashley, PA-C  albuterol (PROVENTIL) (2.5 MG/3ML) 0.083% nebulizer solution Take 3 mLs (2.5 mg total) by nebulization every 6 (six) hours as needed for wheezing or shortness of breath. 12/15/17   Enid DerryWagner, Ashley, PA-C  metroNIDAZOLE (FLAGYL) 500 MG tablet Take 1 tablet (500 mg total) by mouth 2 (two) times daily for 14 doses. 02/25/18 03/04/18  Wilfred Dayrit, Charlesetta IvoryJenise V Bacon, PA-C    Allergies Eggs or egg-derived products; Montelukast sodium; and Penicillins  No family history on file.  Social History Social History   Tobacco Use  . Smoking status: Never Smoker  . Smokeless tobacco: Never Used  Substance Use Topics  . Alcohol use: No  . Drug use: No    Review of Systems  Constitutional: Negative for fever. Eyes: Negative for visual changes. ENT: Negative for sore throat. Cardiovascular: Negative for chest pain. Respiratory: Negative for shortness of  breath. Gastrointestinal: Negative for abdominal pain, vomiting and diarrhea. Genitourinary: Negative for dysuria. Musculoskeletal: Negative for back pain. Skin: Negative for rash. Neurological: Negative for headaches, focal weakness or numbness. ____________________________________________  PHYSICAL EXAM:  VITAL SIGNS: ED Triage Vitals  Enc Vitals Group     BP      Pulse      Resp      Temp      Temp src      SpO2      Weight      Height      Head Circumference      Peak Flow      Pain Score      Pain Loc      Pain Edu?      Excl. in GC?     Constitutional: Alert and oriented. Well appearing and in no distress. Head: Normocephalic and atraumatic. Cardiovascular: Normal rate, regular rhythm. Normal distal pulses. Respiratory: Normal respiratory effort. No wheezes/rales/rhonchi. GU: Normal external genitalia.  Cervix is closed.  There is a moderate thick white discharge noted in the vagina.  No CMT, no adnexal masses. Musculoskeletal: Nontender with normal range of motion in all extremities.  Neurologic:  Normal gait without ataxia. Normal speech and language. No gross focal neurologic deficits are appreciated. Skin:  Skin is warm, dry and intact. No rash noted. Psychiatric: Mood and affect are normal. Patient exhibits appropriate insight and judgment. ____________________________________________   LABS (pertinent positives/negatives)  Labs Reviewed  WET PREP, GENITAL - Abnormal; Notable for the following components:      Result  Value   Clue Cells Wet Prep HPF POC PRESENT (*)    WBC, Wet Prep HPF POC FEW (*)    All other components within normal limits  URINALYSIS, COMPLETE (UACMP) WITH MICROSCOPIC - Abnormal; Notable for the following components:   Color, Urine YELLOW (*)    APPearance HAZY (*)    Squamous Epithelial / LPF 6-30 (*)    All other components within normal limits  CHLAMYDIA/NGC RT PCR ([facility] ONLY)  POC URINE PREG, ED  POCT PREGNANCY, URINE   ____________________________________________  INITIAL IMPRESSION / ASSESSMENT AND PLAN / ED COURSE  Patient with ED testing for routine STD exposure.  Patient reports no current complaints.  She is reassured by her negative wet prep results.  She will contact the ED in 2 hours for results of her routine GC culture. ____________________________________________  FINAL CLINICAL IMPRESSION(S) / ED DIAGNOSES  Final diagnoses:  Possible exposure to STD  Bacterial vaginosis      Karmen Stabs, Charlesetta Ivory, PA-C 02/25/18 1556    Arnaldo Natal, MD 02/25/18 [phone]

## 2018-02-25 NOTE — Discharge Instructions (Addendum)
You have been tested for STD exposure. You will be treated for BV. Your gonorrhea-chlamydia test are still running. You may call back in 2 hours for those results. Take the antibiotic as directed. Do not engage in any sexual activity until you have completed your meds. Follow-up with the Health Department for further management.

## 2018-02-25 NOTE — ED Triage Notes (Signed)
Presents for poss STD  Denies any vaginal discharge or pain  States she missed her last appt with the health dept

## 2018-03-17 ENCOUNTER — Encounter: Payer: Self-pay | Admitting: Emergency Medicine

## 2018-03-17 ENCOUNTER — Emergency Department
Admission: EM | Admit: 2018-03-17 | Discharge: 2018-03-17 | Disposition: A | Discharge status: Home / Self Care | Payer: Self-pay | Attending: Emergency Medicine | Admitting: Emergency Medicine

## 2018-03-17 ENCOUNTER — Other Ambulatory Visit: Payer: Self-pay

## 2018-03-17 DIAGNOSIS — J4521 Mild intermittent asthma with (acute) exacerbation: Secondary | ICD-10-CM | POA: Insufficient documentation

## 2018-03-17 MED ORDER — ALBUTEROL SULFATE HFA 108 (90 BASE) MCG/ACT IN AERS: 2.0000 | INHALATION_SPRAY | RESPIRATORY_TRACT | 0 refills | Status: DC | PRN

## 2018-03-17 MED ORDER — PREDNISONE 50 MG PO TABS: 50.0000 mg | ORAL_TABLET | Freq: Every day | ORAL | 0 refills | Status: DC

## 2018-03-17 MED ORDER — IPRATROPIUM-ALBUTEROL 0.5-2.5 (3) MG/3ML IN SOLN
3.0000 mL | Freq: Once | RESPIRATORY_TRACT | Status: AC
Start: 2018-03-17 — End: 2018-03-17
  Administered 2018-03-17: 3 mL via RESPIRATORY_TRACT
  Filled 2018-03-17: qty 3

## 2018-03-17 MED ORDER — METHYLPREDNISOLONE SODIUM SUCC 125 MG IJ SOLR
125.0000 mg | Freq: Once | INTRAMUSCULAR | Status: AC
  Administered 2018-03-17: 125 mg via INTRAMUSCULAR
  Filled 2018-03-17: qty 2

## 2018-03-17 MED ORDER — ALBUTEROL SULFATE (2.5 MG/3ML) 0.083% IN NEBU: 2.5000 mg | INHALATION_SOLUTION | Freq: Four times a day (QID) | RESPIRATORY_TRACT | 1 refills | Status: DC | PRN

## 2018-03-17 MED ORDER — IPRATROPIUM-ALBUTEROL 0.5-2.5 (3) MG/3ML IN SOLN
6.0000 mL | Freq: Once | RESPIRATORY_TRACT | Status: AC
  Administered 2018-03-17: 6 mL via RESPIRATORY_TRACT
  Filled 2018-03-17: qty 6

## 2018-03-17 NOTE — ED Provider Notes (Signed)
[facility] Emergency Department Provider Note  ____________________________________________  Time seen: Approximately 4:47 PM  I have reviewed the triage vital signs and the nursing notes.   HISTORY  Chief Complaint Wheezing    HPI Kathryn Nash is a 22 y.o. female who presents the emergency department complaining of shortness of breath, wheezing.  Patient reports that she woke up this morning with "difficulty breathing."  Patient has a history of asthma and states that she felt like she had an asthma exacerbation.  She has used rescue inhaler as well as nebulizer machine at home with no relief.  Patient denies any nasal congestion, sore throat, fevers or chills, coughing, abdominal pain, nausea or vomiting.  Patient reports that she has had seasonal allergies with sneezing leading up to shortness of breath.  No other complaints.  Other than albuterol, no medications for this complaint prior to arrival.  Past Medical History:  Diagnosis Date  . Asthma     There are no active problems to display for this patient.   History reviewed. No pertinent surgical history.  Prior to Admission medications   Medication Sig Start Date End Date Taking? Authorizing Provider  albuterol (PROVENTIL HFA;VENTOLIN HFA) 108 (90 Base) MCG/ACT inhaler Inhale 2 puffs into the lungs every 4 (four) hours as needed for wheezing or shortness of breath. 03/17/18   Antoine Fiallos, Delorise Royals, PA-C  albuterol (PROVENTIL) (2.5 MG/3ML) 0.083% nebulizer solution Take 3 mLs (2.5 mg total) by nebulization every 6 (six) hours as needed for wheezing or shortness of breath. 03/17/18   Kataleah Bejar, Delorise Royals, PA-C  predniSONE (DELTASONE) 50 MG tablet Take 1 tablet (50 mg total) by mouth daily with breakfast. 03/17/18   Yenesis Even, Delorise Royals, PA-C    Allergies Eggs or egg-derived products; Montelukast sodium; and Penicillins  No family history on file.  Social History Social History   Tobacco Use  .  Smoking status: Never Smoker  . Smokeless tobacco: Never Used  Substance Use Topics  . Alcohol use: No  . Drug use: No     Review of Systems  Constitutional: No fever/chills Eyes: No visual changes. No discharge ENT: No upper respiratory complaints. Cardiovascular: no chest pain. Respiratory: no cough. positive for shortness of breath and wheezing Gastrointestinal: No abdominal pain.  No nausea, no vomiting.  No diarrhea.  No constipation. Musculoskeletal: Negative for musculoskeletal pain. Skin: Negative for rash, abrasions, lacerations, ecchymosis. Neurological: Negative for headaches, focal weakness or numbness. 10-point ROS otherwise negative.  ____________________________________________   PHYSICAL EXAM:  VITAL SIGNS: ED Triage Vitals  Enc Vitals Group     BP 03/17/18 1546 134/89     Pulse Rate 03/17/18 1544 (!) 107     Resp 03/17/18 1544 20     Temp 03/17/18 1544 98.3 F (36.8 C)     Temp Source 03/17/18 1544 Oral     SpO2 03/17/18 1544 95 %     Weight 03/17/18 1545 210 lb (95.3 kg)     Height 03/17/18 1545 5\' 6"  (1.676 m)     Head Circumference --      Peak Flow --      Pain Score 03/17/18 1545 0     Pain Loc --      Pain Edu? --      Excl. in GC? --      Constitutional: Alert and oriented. Well appearing and in no acute distress. Eyes: Conjunctivae are normal. PERRL. EOMI. Head: Atraumatic. ENT:      Ears: EACs and  TMs unremarkable bilaterally.      Nose: No congestion/rhinnorhea.      Mouth/Throat: Mucous membranes are moist.  Neck: No stridor.   Hematological/Lymphatic/Immunilogical: No cervical lymphadenopathy. Cardiovascular: Normal rate, regular rhythm. Normal S1 and S2.  Good peripheral circulation. Respiratory: Normal respiratory effort without tachypnea or retractions. Lungs with diffuse inspiratory and expiratory wheezing bilaterally.  No rales or rhonchi.Peri [place]. Good air entry to the bases with no decreased or absent breath sounds. Musculoskeletal:  Full range of motion to all extremities. No gross deformities appreciated. Neurologic:  Normal speech and language. No gross focal neurologic deficits are appreciated.  Skin:  Skin is warm, dry and intact. No rash noted. Psychiatric: Mood and affect are normal. Speech and behavior are normal. Patient exhibits appropriate insight and judgement.   ____________________________________________   LABS (all labs ordered are listed, but only abnormal results are displayed)  Labs Reviewed - No data to display ____________________________________________  EKG   ____________________________________________  RADIOLOGY   No results found.  ____________________________________________    PROCEDURES  Procedure(s) performed:    Procedures    Medications  ipratropium-albuterol (DUONEB) 0.5-2.5 (3) MG/3ML nebulizer solution 3 mL (3 mLs Nebulization Given 03/17/18 1625)  ipratropium-albuterol (DUONEB) 0.5-2.5 (3) MG/3ML nebulizer solution 6 mL (6 mLs Nebulization Given 03/17/18 1705)  methylPREDNISolone sodium succinate (SOLU-MEDROL) 125 mg/2 mL injection 125 mg (125 mg Intramuscular Given 03/17/18 1704)     ____________________________________________   INITIAL IMPRESSION / ASSESSMENT AND PLAN / ED COURSE  Pertinent labs & imaging results that were available during my care of the patient were reviewed by me and considered in my medical decision making (see chart for details).  Review of the [place] CSRS was performed in accordance of the [facility] prior to dispensing any controlled drugs.     Patient's diagnosis is consistent with asthma exacerbation.  Patient presented with wheezing, shortness of breath times 1 day.  Patient has seasonal allergies, as well as a history of asthma.  Patient had no viral URI symptoms.  On exam, wheezing bilaterally.  Patient responded to DuoNeb treatments and Solu-Medrol in the emergency department.  On reexamination, patient's lung sounds have cleared, no  further wheezing.  Patient reports significant symptomatic improvement.  This time, patient is stable for discharge.  Patient's albuterol is out of date and I will refill both nebulizer and inhaler.  Patient will be prescribed 5 days of steroids.  Return precautions given.. Patient is to follow up with primary care as needed or otherwise directed. Patient is given ED precautions to return to the ED for any worsening or new symptoms.     ____________________________________________  FINAL CLINICAL IMPRESSION(S) / ED DIAGNOSES  Final diagnoses:  Mild intermittent asthma with exacerbation      NEW MEDICATIONS STARTED DURING THIS VISIT:  ED Discharge Orders        Ordered    albuterol (PROVENTIL HFA;VENTOLIN HFA) 108 (90 Base) MCG/ACT inhaler  Every 4 hours PRN     03/17/18 1730    albuterol (PROVENTIL) (2.5 MG/3ML) 0.083% nebulizer solution  Every 6 hours PRN     03/17/18 1730    predniSONE (DELTASONE) 50 MG tablet  Daily with breakfast     03/17/18 1730          This chart was dictated using voice recognition software/Dragon. Despite best efforts to proofread, errors can occur which can change the meaning. Any change was purely unintentional.    Racheal PatchesCuthriell, Venie Montesinos D, PA-C 03/17/18 1731    Dionne BucySiadecki, Sebastian, MD 03/17/18  2355  

## 2018-03-17 NOTE — ED Triage Notes (Signed)
Awoke this morning with 'breathing problems".  Patient states wheezing started today.  Patient has history of asthma.  Used home nebulizer and inhaler, but has not helped.  Nebulizer last used just PTA.

## 2018-07-16 ENCOUNTER — Emergency Department
Admission: EM | Admit: 2018-07-16 | Discharge: 2018-07-16 | Discharge status: Against Medical Advice | Payer: Self-pay | Attending: Emergency Medicine | Admitting: Emergency Medicine

## 2018-07-16 ENCOUNTER — Other Ambulatory Visit: Payer: Self-pay

## 2018-07-16 ENCOUNTER — Encounter: Payer: Self-pay | Admitting: Intensive Care

## 2018-07-16 DIAGNOSIS — H5712 Ocular pain, left eye: Secondary | ICD-10-CM | POA: Insufficient documentation

## 2018-07-16 DIAGNOSIS — J45909 Unspecified asthma, uncomplicated: Secondary | ICD-10-CM | POA: Insufficient documentation

## 2018-07-16 MED ORDER — ACETAMINOPHEN 500 MG PO TABS
1000.0000 mg | ORAL_TABLET | Freq: Once | ORAL | Status: DC
  Filled 2018-07-16 (×2): qty 2

## 2018-07-16 MED ORDER — FLUORESCEIN SODIUM 1 MG OP STRP
1.0000 | ORAL_STRIP | Freq: Once | OPHTHALMIC | Status: DC
  Filled 2018-07-16: qty 1

## 2018-07-16 MED ORDER — TETRACAINE HCL 0.5 % OP SOLN
2.0000 [drp] | Freq: Once | OPHTHALMIC | Status: DC
Start: 2018-07-16 — End: 2018-07-17
  Filled 2018-07-16: qty 4

## 2018-07-16 NOTE — ED Provider Notes (Signed)
[facility] Provider Note   CSN: [id number] Arrival date & time: 07/16/18  1806     History   Chief Complaint Chief Complaint  Patient presents with  . Eye Pain    Left eye    HPI Kathryn Nash is a 22 y.o. female.  Resents the emergency department for evaluation of left eye pain.  Patient complains of left eye pain that began 1 day ago upon awakening.  She denies any trauma or itching.  She denies any photophobia, foreign body sensation, watering, redness or soft tissue swelling, vision changes.  Patient states she has a aching throbbing pain behind her left eye.  She denies any right eye symptoms.  She denies any headache.  Pain is 5 out of 10.  She has not had any medications for pain.  She denies any numbness or tingling or weakness.  HPI  Past Medical History:  Diagnosis Date  . Asthma     There are no active problems to display for this patient.   History reviewed. No pertinent surgical history.   OB History   None      Home Medications    Prior to Admission medications   Medication Sig Start Date End Date Taking? Authorizing Provider  albuterol (PROVENTIL HFA;VENTOLIN HFA) 108 (90 Base) MCG/ACT inhaler Inhale 2 puffs into the lungs every 4 (four) hours as needed for wheezing or shortness of breath. 03/17/18   Cuthriell, Delorise RoyalsJonathan D, PA-C  albuterol (PROVENTIL) (2.5 MG/3ML) 0.083% nebulizer solution Take 3 mLs (2.5 mg total) by nebulization every 6 (six) hours as needed for wheezing or shortness of breath. 03/17/18   Cuthriell, Delorise RoyalsJonathan D, PA-C  predniSONE (DELTASONE) 50 MG tablet Take 1 tablet (50 mg total) by mouth daily with breakfast. 03/17/18   Cuthriell, Delorise RoyalsJonathan D, PA-C    Family History History reviewed. No pertinent family history.  Social History Social History   Tobacco Use  . Smoking status: Never Smoker  . Smokeless tobacco: Never Used  Substance Use Topics  . Alcohol use: No  . Drug use: No      Allergies   Eggs or egg-derived products; Montelukast sodium; and Penicillins   Review of Systems Review of Systems  Constitutional: Negative for fever.  Eyes: Positive for pain. Negative for photophobia, discharge, redness, itching and visual disturbance.  Skin: Negative for rash and wound.  Neurological: Negative for weakness, numbness and headaches.     Physical Exam Updated Vital Signs BP 111/76 (BP Location: Left Arm)   Pulse 69   Temp 98.6 F (37 C) (Oral)   Resp 16   Ht 5\' 6"  (1.676 m)   Wt 90.7 kg   LMP 07/02/2018   SpO2 99%   BMI 32.28 kg/m   Physical Exam  Constitutional: She is oriented to person, place, and time. She appears well-developed and well-nourished.  HENT:  Head: Normocephalic and atraumatic.  Eyes: Pupils are equal, round, and reactive to light. Conjunctivae, EOM and lids are normal. Lids are everted and swept, no foreign bodies found. Left eye exhibits no discharge and no exudate. No foreign body present in the left eye. Left conjunctiva is not injected. Left conjunctiva has no hemorrhage. Right eye exhibits normal extraocular motion. Left eye exhibits normal extraocular motion. Right pupil is reactive. Left pupil is reactive.  Fundoscopic exam:      The left eye shows no exudate, no hemorrhage and no papilledema.  Slit lamp exam:      The left  eye shows no corneal ulcer, no foreign body, no hyphema, no fluorescein uptake and no anterior chamber bulge.  Visual acuity 20/40 right eye, 20/30 left eye, 20/40 bilateral Tonometer readings left eye IOP of 19, 18, 15.  Normal field of vision bilaterally  Neck: Normal range of motion.  Cardiovascular: Normal rate.  Pulmonary/Chest: Effort normal. No respiratory distress.  Musculoskeletal: Normal range of motion.  Neurological: She is alert and oriented to person, place, and time.  Skin: Skin is warm. No rash noted.  Psychiatric: She has a normal mood and affect. Her behavior is normal. Thought  content normal.     ED Treatments / Results  Labs (all labs ordered are listed, but only abnormal results are displayed) Labs Reviewed  BASIC METABOLIC PANEL  CBC  POC URINE PREG, ED    EKG None  Radiology No results found.  Procedures Procedures (including critical care time)  Medications Ordered in ED Medications  fluorescein ophthalmic strip 1 strip (has no administration in time range)  tetracaine (PONTOCAINE) 0.5 % ophthalmic solution 2 drop (has no administration in time range)  acetaminophen (TYLENOL) tablet 1,000 mg (1,000 mg Oral Not Given 07/16/18 2134)     Initial Impression / Assessment and Plan / ED Course  I have reviewed the triage vital signs and the nursing notes.  Pertinent labs & imaging results that were available during my care of the patient were reviewed by me and considered in my medical decision making (see chart for details).     22 year old female with moderate left eye pain.  No trauma or injury.  No foreign body sensation.  She denies any headache or rashes.  Pain is described as a dull ache and pressure.  Tonometer readings showed within normal limits pressures of 19, 18, 15.  There is no sign of floor seen dye uptake.  CT of the orbits with contrast ordered but patient left AGAINST MEDICAL ADVICE due to transportation issues with family.  Patient understands risks of leaving AMA.  Patient was given information to call ophthalmologist tomorrow morning to schedule follow-up appointment.  Patient was given very strict instructions to return to the emergency department immediately for such as any vision loss, increasing pain, headaches.  Final Clinical Impressions(s) / ED Diagnoses   Final diagnoses:  Left eye pain    ED Discharge Orders    None       Ronnette JuniperGaines, Jesenia Spera C, PA-C 07/16/18 2155    Sharyn CreamerQuale, Mark, MD 07/17/18 0005

## 2018-07-16 NOTE — ED Triage Notes (Signed)
Patient c/o blurry vision in left eye and sharp pains when she moves her eye that started yesterday

## 2018-07-16 NOTE — ED Notes (Signed)
Pt refusing pain meds. Pt states she has to get her mom's car back and cannot stay to finish labs. Pt told she would have to sign out AMA. Pt agrees. Pt given instructions to go to the eye doctor tomorrow.

## 2018-07-16 NOTE — ED Notes (Signed)
Per MD Don PerkingVeronese flex appropriate for eye exam

## 2018-07-16 NOTE — Discharge Instructions (Addendum)
Please return to the ER immediately for any increasing eye pain, worsening symptoms or urgent changes in health..  Please call ophthalmologist tomorrow morning to schedule follow-up appointment.

## 2018-10-13 ENCOUNTER — Other Ambulatory Visit: Payer: Self-pay

## 2018-10-13 ENCOUNTER — Encounter: Payer: Self-pay | Admitting: Emergency Medicine

## 2018-10-13 ENCOUNTER — Emergency Department
Admission: EM | Admit: 2018-10-13 | Discharge: 2018-10-13 | Disposition: A | Discharge status: Home / Self Care | Payer: Self-pay | Attending: Emergency Medicine | Admitting: Emergency Medicine

## 2018-10-13 DIAGNOSIS — B9689 Other specified bacterial agents as the cause of diseases classified elsewhere: Secondary | ICD-10-CM | POA: Insufficient documentation

## 2018-10-13 DIAGNOSIS — N760 Acute vaginitis: Secondary | ICD-10-CM | POA: Insufficient documentation

## 2018-10-13 DIAGNOSIS — Z3202 Encounter for pregnancy test, result negative: Secondary | ICD-10-CM | POA: Insufficient documentation

## 2018-10-13 DIAGNOSIS — N898 Other specified noninflammatory disorders of vagina: Secondary | ICD-10-CM | POA: Insufficient documentation

## 2018-10-13 DIAGNOSIS — J45909 Unspecified asthma, uncomplicated: Secondary | ICD-10-CM | POA: Insufficient documentation

## 2018-10-13 LAB — URINALYSIS, COMPLETE (UACMP) WITH MICROSCOPIC
BACTERIA UA: NONE SEEN
BILIRUBIN URINE: NEGATIVE
Glucose, UA: NEGATIVE mg/dL
Hgb urine dipstick: NEGATIVE
KETONES UR: NEGATIVE mg/dL
LEUKOCYTES UA: NEGATIVE
Nitrite: NEGATIVE
PROTEIN: NEGATIVE mg/dL
Specific Gravity, Urine: 1.024 (ref 1.005–1.030)
pH: 5 (ref 5.0–8.0)

## 2018-10-13 LAB — CHLAMYDIA/NGC RT PCR (ARMC ONLY)
CHLAMYDIA TR: NOT DETECTED
N GONORRHOEAE: NOT DETECTED

## 2018-10-13 LAB — WET PREP, GENITAL
Sperm: NONE SEEN
Trich, Wet Prep: NONE SEEN
YEAST WET PREP: NONE SEEN

## 2018-10-13 LAB — COMPREHENSIVE METABOLIC PANEL
ALT: 12 U/L (ref 0–44)
ANION GAP: 9 (ref 5–15)
AST: 14 U/L — AB (ref 15–41)
Albumin: 4.1 g/dL (ref 3.5–5.0)
Alkaline Phosphatase: 78 U/L (ref 38–126)
BUN: 11 mg/dL (ref 6–20)
CO2: 26 mmol/L (ref 22–32)
Calcium: 9.1 mg/dL (ref 8.9–10.3)
Chloride: 104 mmol/L (ref 98–111)
Creatinine, Ser: 0.66 mg/dL (ref 0.44–1.00)
GFR calc Af Amer: >60 mL/min (ref >60)
GFR calc non Af Amer: >60 mL/min (ref >60)
GLUCOSE: 93 mg/dL (ref 70–99)
Potassium: 4 mmol/L (ref 3.5–5.1)
Sodium: 139 mmol/L (ref 135–145)
TOTAL PROTEIN: 8.5 g/dL — AB (ref 6.5–8.1)
Total Bilirubin: 0.5 mg/dL (ref 0.3–1.2)

## 2018-10-13 LAB — CBC
HCT: 42.5 % (ref 36.0–46.0)
Hemoglobin: 14.2 g/dL (ref 12.0–15.0)
MCH: 30.1 pg (ref 26.0–34.0)
MCHC: 33.4 g/dL (ref 30.0–36.0)
MCV: 90 fL (ref 80.0–100.0)
NRBC: 0 % (ref 0.0–0.2)
Platelets: 484 10*3/uL — ABNORMAL HIGH (ref 150–400)
RBC: 4.72 MIL/uL (ref 3.87–5.11)
RDW: 12.2 % (ref 11.5–15.5)
WBC: 19.9 10*3/uL — ABNORMAL HIGH (ref 4.0–10.5)

## 2018-10-13 LAB — POCT PREGNANCY, URINE: PREG TEST UR: NEGATIVE

## 2018-10-13 LAB — LIPASE, BLOOD: Lipase: 27 U/L (ref 11–51)

## 2018-10-13 LAB — HCG, QUANTITATIVE, PREGNANCY

## 2018-10-13 MED ORDER — METRONIDAZOLE 500 MG PO TABS: 500.0000 mg | ORAL_TABLET | Freq: Three times a day (TID) | ORAL | 0 refills | Status: DC

## 2018-10-13 NOTE — ED Provider Notes (Signed)
[facility] Emergency Department Provider Note   ____________________________________________   First MD Initiated Contact with Patient 10/13/18 1023     (approximate)  I have reviewed the triage vital signs and the nursing notes.   HISTORY  Chief Complaint Abdominal Pain    HPI Kathryn Nash is a 22 y.o. female here for evaluation of abdominal pain  Patient reports that she is been intermittently experiencing crampy lower abdominal pain for a week now.  Comes and goes, presently not having any.  She reports that she took a few home pregnancy test, and she thought that they are all showing her is positive.  Reports she could be pregnant, she is sexually active.  She thinks she has had one STD in the past, and does have frequent intercourse.  May be a little bit of abnormal vaginal odor.  Denies any discharge or bleeding.  Currently having no pain, but reports it comes and goes seems to be somewhat crampy in nature.  Last menstrual cycle was due to occur about 7 days ago.  Her previous normal cycle was just over a month ago  No fevers or chills.  No nausea vomiting.  No diarrhea.   Past Medical History:  Diagnosis Date  . Asthma     There are no active problems to display for this patient.   History reviewed. No pertinent surgical history.  Prior to Admission medications   Medication Sig Start Date End Date Taking? Authorizing Provider  albuterol (PROVENTIL HFA;VENTOLIN HFA) 108 (90 Base) MCG/ACT inhaler Inhale 2 puffs into the lungs every 4 (four) hours as needed for wheezing or shortness of breath. 03/17/18   Cuthriell, Delorise Royals, PA-C  albuterol (PROVENTIL) (2.5 MG/3ML) 0.083% nebulizer solution Take 3 mLs (2.5 mg total) by nebulization every 6 (six) hours as needed for wheezing or shortness of breath. 03/17/18   Cuthriell, Delorise Royals, PA-C  metroNIDAZOLE (FLAGYL) 500 MG tablet Take 1 tablet (500 mg total) by mouth 3 (three) times daily. 10/13/18    Sharyn Creamer, MD  predniSONE (DELTASONE) 50 MG tablet Take 1 tablet (50 mg total) by mouth daily with breakfast. 03/17/18   Cuthriell, Delorise Royals, PA-C    Allergies Eggs or egg-derived products; Montelukast sodium; and Penicillins  History reviewed. No pertinent family history.  Social History Social History   Tobacco Use  . Smoking status: Never Smoker  . Smokeless tobacco: Never Used  Substance Use Topics  . Alcohol use: No  . Drug use: No    Review of Systems Constitutional: No fever/chills Eyes: No visual changes. ENT: No sore throat. Cardiovascular: Denies chest pain. Respiratory: Denies shortness of breath. Gastrointestinal: No abdominal pain except what she describes as "pelvic" discomfort.   Genitourinary: Negative for dysuria. Musculoskeletal: Negative for back pain. Skin: Negative for rash. Neurological: Negative for headaches, areas of focal weakness or numbness.    ____________________________________________   PHYSICAL EXAM:  VITAL SIGNS: ED Triage Vitals  Enc Vitals Group     BP 10/13/18 0859 (!) 131/94     Pulse Rate 10/13/18 0859 74     Resp 10/13/18 0859 18     Temp 10/13/18 0859 97.6 F (36.4 C)     Temp Source 10/13/18 0859 Oral     SpO2 10/13/18 0859 95 %     Weight 10/13/18 0903 210 lb (95.3 kg)     Height 10/13/18 0903 5\' 6"  (1.676 m)     Head Circumference --      Peak Flow --  Pain Score 10/13/18 0903 0     Pain Loc --      Pain Edu? --      Excl. in GC? --     Constitutional: Alert and oriented. Well appearing and in no acute distress. Eyes: Conjunctivae are normal. Head: Atraumatic. Nose: No congestion/rhinnorhea. Mouth/Throat: Mucous membranes are moist. Neck: No stridor.  Cardiovascular: Normal rate, regular rhythm. Grossly normal heart sounds.  Good peripheral circulation. Respiratory: Normal respiratory effort.  No retractions. Lungs CTAB. Gastrointestinal: Soft and nontender reports some discomfort to palpation  suprapubically without any rebound or guarding.  No focal discomfort to McBurney's point.  Negative Rovsing.  Negative Murphy.. No distention. Gynecologic: Performed with nurse Vernona Rieger, external exam is normal.  Internal exam no cervical motion tenderness.  She does however have a very slight slightly malodorous whitish discharge noted within the vagina.  No heavy purulence.  No masses. Musculoskeletal: No lower extremity tenderness nor edema. Neurologic:  Normal speech and language. No gross focal neurologic deficits are appreciated.  Skin:  Skin is warm, dry and intact. No rash noted. Psychiatric: Mood and affect are normal. Speech and behavior are normal.  ____________________________________________   LABS (all labs ordered are listed, but only abnormal results are displayed)  Labs Reviewed  WET PREP, GENITAL - Abnormal; Notable for the following components:      Result Value   Clue Cells Wet Prep HPF POC PRESENT (*)    WBC, Wet Prep HPF POC FEW (*)    All other components within normal limits  COMPREHENSIVE METABOLIC PANEL - Abnormal; Notable for the following components:   Total Protein 8.5 (*)    AST 14 (*)    All other components within normal limits  CBC - Abnormal; Notable for the following components:   WBC 19.9 (*)    Platelets 484 (*)    All other components within normal limits  URINALYSIS, COMPLETE (UACMP) WITH MICROSCOPIC - Abnormal; Notable for the following components:   Color, Urine YELLOW (*)    APPearance HAZY (*)    All other components within normal limits  CHLAMYDIA/NGC RT PCR ([facility] ONLY)  LIPASE, BLOOD  HCG, QUANTITATIVE, PREGNANCY  POC URINE PREG, ED  POCT PREGNANCY, URINE   ____________________________________________  EKG   ____________________________________________  RADIOLOGY    ____________________________________________   PROCEDURES  Procedure(s) performed: None  Procedures  Critical Care performed:  No  ____________________________________________   INITIAL IMPRESSION / ASSESSMENT AND PLAN / ED COURSE  Pertinent labs & imaging results that were available during my care of the patient were reviewed by me and considered in my medical decision making (see chart for details).   Patient returns for evaluation of possible pregnancy test being positive at home, also reports intermittent somewhat crampy lower abdominal discomfort was slightly abnormal vaginal odor.  Clinical examination does demonstrate some abnormality of odor, also slight white discharge in the vagina.  Her abdominal examination is reassuring, no evidence of peritonitis or focal abdominal discomfort to suggest cholecystitis, appendicitis, or other acute intra-abdominal etiology.  hCG here is negative, patient is not pregnant.  Her white blood cell count is notably elevated, but review of previous demonstrates a fairly persistent leukocytosis on previous lab work as well.  I suspect this may be a somewhat chronic finding, though could be acute.  ----------------------------------------- 2:16 PM on 10/13/2018 -----------------------------------------  Wet prep demonstrates positive clue cells and white blood cells.  Is consistent with bacterial vaginosis based on clinical history and examination.  Patient resting comfortably, no  distress.  Discussed side effects of Flagyl and not use with alcohol with the patient is in agreement.  Return precautions discussed, suggested follow-up with OB/GYN.  Return precautions and treatment recommendations and follow-up discussed with the patient who is agreeable with the plan.       ____________________________________________   FINAL CLINICAL IMPRESSION(S) / ED DIAGNOSES  Final diagnoses:  BV (bacterial vaginosis)        Note:  This document was prepared using Dragon voice recognition software and may include unintentional dictation errors       Sharyn Creamer, MD 10/13/18  1417

## 2018-10-13 NOTE — ED Notes (Signed)
Patient ambulatory to Rm 25, Vernona Rieger RN aware of placement.

## 2018-10-13 NOTE — Discharge Instructions (Signed)
? ?  Please return to the emergency room right away if you are to develop a fever, severe nausea, your pain becomes severe or worsens, you are unable to keep food down, begin vomiting any dark or bloody fluid, you develop any dark or bloody stools, feel dehydrated, or other new concerns or symptoms arise. ? ?

## 2018-10-13 NOTE — ED Triage Notes (Signed)
Pt c/o lower abdominal and pelvic pain X 1 week. Denies urinary sx.  Has had nausea. Denies diarrhea or fevers.  VSS. Ambulatory. Color WNL

## 2018-10-24 ENCOUNTER — Emergency Department: Payer: Self-pay

## 2018-10-24 ENCOUNTER — Other Ambulatory Visit: Payer: Self-pay

## 2018-10-24 ENCOUNTER — Encounter: Payer: Self-pay | Admitting: Emergency Medicine

## 2018-10-24 ENCOUNTER — Emergency Department
Admission: EM | Admit: 2018-10-24 | Discharge: 2018-10-24 | Disposition: A | Discharge status: Home / Self Care | Payer: Self-pay | Attending: Emergency Medicine | Admitting: Emergency Medicine

## 2018-10-24 DIAGNOSIS — J4520 Mild intermittent asthma, uncomplicated: Secondary | ICD-10-CM | POA: Insufficient documentation

## 2018-10-24 MED ORDER — IPRATROPIUM-ALBUTEROL 0.5-2.5 (3) MG/3ML IN SOLN
3.0000 mL | Freq: Once | RESPIRATORY_TRACT | Status: AC
  Administered 2018-10-24: 3 mL via RESPIRATORY_TRACT
  Filled 2018-10-24: qty 3

## 2018-10-24 MED ORDER — ALBUTEROL SULFATE HFA 108 (90 BASE) MCG/ACT IN AERS: 2.0000 | INHALATION_SPRAY | RESPIRATORY_TRACT | 2 refills | Status: DC | PRN

## 2018-10-24 MED ORDER — ALBUTEROL SULFATE (2.5 MG/3ML) 0.083% IN NEBU: 2.5000 mg | INHALATION_SOLUTION | Freq: Four times a day (QID) | RESPIRATORY_TRACT | 1 refills | Status: DC | PRN

## 2018-10-24 NOTE — ED Provider Notes (Signed)
[facility] Emergency Department Provider Note   ____________________________________________   First MD Initiated Contact with Patient 10/24/18 0720     (approximate)  I have reviewed the triage vital signs and the nursing notes.   HISTORY  Chief Complaint Asthma    HPI Kathryn Nash is a 22 y.o. female patient complain of dyspnea and wheezing.  Patient is asthmatic and has been out of her inhaler for 2 months.  Patient weather changes has increased her wheezing.  Patient denies fever chills associated with this complaint.  Patient denies chest pain.  Past Medical History:  Diagnosis Date  . Asthma     There are no active problems to display for this patient.   History reviewed. No pertinent surgical history.  Prior to Admission medications   Medication Sig Start Date End Date Taking? Authorizing Provider  albuterol (PROVENTIL HFA;VENTOLIN HFA) 108 (90 Base) MCG/ACT inhaler Inhale 2 puffs into the lungs every 4 (four) hours as needed for wheezing or shortness of breath. 10/24/18   Joni Reining, PA-C  albuterol (PROVENTIL) (2.5 MG/3ML) 0.083% nebulizer solution Take 3 mLs (2.5 mg total) by nebulization every 6 (six) hours as needed for wheezing or shortness of breath. 10/24/18   Joni Reining, PA-C  metroNIDAZOLE (FLAGYL) 500 MG tablet Take 1 tablet (500 mg total) by mouth 3 (three) times daily. 10/13/18   Sharyn Creamer, MD  predniSONE (DELTASONE) 50 MG tablet Take 1 tablet (50 mg total) by mouth daily with breakfast. 03/17/18   Cuthriell, Delorise Royals, PA-C    Allergies Eggs or egg-derived products; Montelukast sodium; and Penicillins  No family history on file.  Social History Social History   Tobacco Use  . Smoking status: Never Smoker  . Smokeless tobacco: Never Used  Substance Use Topics  . Alcohol use: No  . Drug use: No    Review of Systems Constitutional: No fever/chills Eyes: No visual changes. ENT: No sore  throat. Cardiovascular: Denies Chest Pain. Respiratory: Wheezing. Gastrointestinal: No abdominal pain.  No nausea, no vomiting.  No diarrhea.  No constipation. Genitourinary: Negative for dysuria. Musculoskeletal: Negative for back pain. Skin: Negative for rash. Neurological: Negative for headaches, focal weakness or numbness. Allergic/Immunilogical: See medication list ____________________________________________   PHYSICAL EXAM:  VITAL SIGNS: ED Triage Vitals  Enc Vitals Group     BP 10/24/18 0648 (!) 102/55     Pulse Rate 10/24/18 0648 98     Resp 10/24/18 0648 (!) 22     Temp 10/24/18 0648 97.9 F (36.6 C)     Temp Source 10/24/18 0648 Oral     SpO2 10/24/18 0648 98 %     Weight 10/24/18 0644 210 lb (95.3 kg)     Height 10/24/18 0644 5\' 6"  (1.676 m)     Head Circumference --      Peak Flow --      Pain Score 10/24/18 0648 0     Pain Loc --      Pain Edu? --      Excl. in GC? --     Constitutional: Alert and oriented. Well appearing and in no acute distress. Neck: No stridor.   Cardiovascular: Normal rate, regular rhythm. Grossly normal heart sounds.  Good peripheral circulation. Respiratory: Normal respiratory effort.  No retractions. Lungs Rales and mild wheezing. Neurologic:  Normal speech and language. No gross focal neurologic deficits are appreciated. No gait instability. Skin:  Skin is warm, dry and intact. No rash noted. Psychiatric: Mood and  affect are normal. Speech and behavior are normal.  ____________________________________________   LABS (all labs ordered are listed, but only abnormal results are displayed)  Labs Reviewed - No data to display ____________________________________________  EKG   ____________________________________________  RADIOLOGY  ED MD interpretation:    Official radiology report(s): Dg Chest 2 View  Result Date: 10/24/2018 CLINICAL DATA:  Shortness of breath and wheezing EXAM: CHEST - 2 VIEW COMPARISON:  December 15, 2017 FINDINGS: Lungs are clear. The heart size and pulmonary vascularity are normal. No adenopathy. No pneumothorax. No bone lesions. IMPRESSION: No edema or consolidation. Electronically Signed   By: Bretta BangWilliam  Woodruff III M.D.   On: 10/24/2018 07:40    ____________________________________________   PROCEDURES  Procedure(s) performed: None  Procedures  Critical Care performed: No  ____________________________________________   INITIAL IMPRESSION / ASSESSMENT AND PLAN / ED COURSE  As part of my medical decision making, I reviewed the following data within the electronic MEDICAL RECORD NUMBER   Patient presents with mild wheezing secondary to asthmatic condition.  Discussed chest x-ray showing no acute pulmonary process.  Patient given discharge care instruction in a prescription for albuterol inhaler/nebulizer solution.  Patient [facility]       ____________________________________________   FINAL CLINICAL IMPRESSION(S) / ED DIAGNOSES  Final diagnoses:  Mild intermittent asthma without complication     ED Discharge Orders         Ordered    albuterol (PROVENTIL HFA;VENTOLIN HFA) 108 (90 Base) MCG/ACT inhaler  Every 4 hours PRN     10/24/18 0750    albuterol (PROVENTIL) (2.5 MG/3ML) 0.083% nebulizer solution  Every 6 hours PRN     10/24/18 0750           Note:  This document was prepared using Dragon voice recognition software and may include unintentional dictation errors.    Joni ReiningSmith,  K, PA-C 10/24/18 [id number]    Emily FilbertWilliams, Jonathan E, MD 10/24/18 [id number]

## 2018-10-24 NOTE — ED Triage Notes (Addendum)
Patient ambulatory to triage with steady gait, without difficulty or distress noted; pt reports out of her inhaler x 2mos (proair); denies any recent illness but reports asthma attacks for last 2wks

## 2018-10-24 NOTE — ED Notes (Signed)
See triage note  Presents with some diff breathing and some wheezing  States she has been out of her inhalers and meds for her machine for about 2 months  Speaking full sentences on arrival

## 2018-11-12 ENCOUNTER — Encounter (HOSPITAL_COMMUNITY): Payer: Self-pay

## 2018-11-12 ENCOUNTER — Emergency Department (HOSPITAL_COMMUNITY): Payer: Self-pay

## 2018-11-12 ENCOUNTER — Emergency Department (HOSPITAL_COMMUNITY)
Admission: EM | Admit: 2018-11-12 | Discharge: 2018-11-12 | Disposition: A | Discharge status: Home / Self Care | Payer: Self-pay | Attending: Emergency Medicine | Admitting: Emergency Medicine

## 2018-11-12 ENCOUNTER — Other Ambulatory Visit: Payer: Self-pay

## 2018-11-12 DIAGNOSIS — J4541 Moderate persistent asthma with (acute) exacerbation: Secondary | ICD-10-CM | POA: Insufficient documentation

## 2018-11-12 MED ORDER — PREDNISONE 20 MG PO TABS
40.0000 mg | ORAL_TABLET | Freq: Once | ORAL | Status: AC
  Administered 2018-11-12: 40 mg via ORAL
  Filled 2018-11-12: qty 2

## 2018-11-12 MED ORDER — ALBUTEROL SULFATE (2.5 MG/3ML) 0.083% IN NEBU
5.0000 mg | INHALATION_SOLUTION | Freq: Once | RESPIRATORY_TRACT | Status: AC
  Administered 2018-11-12: 5 mg via RESPIRATORY_TRACT
  Filled 2018-11-12: qty 6

## 2018-11-12 MED ORDER — CETIRIZINE HCL 10 MG PO TABS: 10.0000 mg | ORAL_TABLET | Freq: Every day | ORAL | 0 refills | Status: DC

## 2018-11-12 MED ORDER — ALBUTEROL SULFATE HFA 108 (90 BASE) MCG/ACT IN AERS: 2.0000 | INHALATION_SPRAY | RESPIRATORY_TRACT | 2 refills | Status: DC | PRN

## 2018-11-12 MED ORDER — ALBUTEROL SULFATE (2.5 MG/3ML) 0.083% IN NEBU: 2.5000 mg | INHALATION_SOLUTION | Freq: Four times a day (QID) | RESPIRATORY_TRACT | 1 refills | Status: DC | PRN

## 2018-11-12 MED ORDER — PREDNISONE 20 MG PO TABS: 40.0000 mg | ORAL_TABLET | Freq: Every day | ORAL | 0 refills | Status: DC

## 2018-11-12 MED ORDER — ALBUTEROL SULFATE HFA 108 (90 BASE) MCG/ACT IN AERS
2.0000 | INHALATION_SPRAY | Freq: Once | RESPIRATORY_TRACT | Status: AC
  Administered 2018-11-12: 2 via RESPIRATORY_TRACT
  Filled 2018-11-12: qty 6.7

## 2018-11-12 NOTE — Discharge Instructions (Addendum)
Ms. Kathryn Nash, I am sorry that you are feeling poorly. It appears that you have had an asthma exacerbation.  Please complete the 5 day course of prednisone, take 40 mg daily starting tomorrow Please continue to use the inhaler and nebulizer.  Make an appointment with a primary care provider and they can discuss other asthma medications with you.

## 2018-11-12 NOTE — ED Provider Notes (Signed)
[facility]-EMERGENCY DEPT Provider Note   CSN: [id number] Arrival date & time: 11/12/18  7829  History   Chief Complaint Chief Complaint  Patient presents with  . Asthma  . Cough    HPI Kathryn Nash is a 22 y.o. female.   This is a 22 year old female with a history of asthma who presents with increases cough chest tightness shortness of breath, and wheezing for the apst 2 weeks.  She went to [facility] last week and had a chest x-ray that was negative at that time reports that she did not get a breathing treatment and was discharged.  She reports that she has been having an asthma attack every day for the past 2 weeks, will Caroid anytime a day and involves chest tightness, shortness of breath, wheezing, cough with some production but she is unable to cough anything up.  She has a nebulizer albuterol at home that she has been using up to 3-4 times a day.  She had been given inhaler on her last emergency room visit and use that with that 3 to 4 days.  She denies any other symptoms going on such as fevers, chills, weakness, nausea, vomiting, or generalized pain.  Denies any recent sick contacts.  She does not smoke or drink alcohol and is currently working at [employer].  She reports that she does not have a primary care physician and will go to an urgent care if she has issues.  She has had asthma exacerbations asked that often occur around this time of the year.  Has seen an allergist in the past and was found to have multiple allergies to things such as dogs, cats, grasses, weeds and some foods.  She is not currently on any medications at this time.      Past Medical History:  Diagnosis Date  . Asthma     There are no active problems to display for this patient.   History reviewed. No pertinent surgical history.   OB History   None      Home Medications    Prior to Admission medications   Medication Sig Start Date End Date Taking?  Authorizing Provider  guaiFENesin (MUCINEX PO) Take 2 tablets by mouth daily.   Yes [provider]  Pseudoeph-Doxylamine-DM-APAP (NYQUIL PO) Take 2 tablets by mouth daily.   Yes [provider]  albuterol (PROVENTIL HFA;VENTOLIN HFA) 108 (90 Base) MCG/ACT inhaler Inhale 2 puffs into the lungs every 4 (four) hours as needed for wheezing or shortness of breath. 11/12/18   Claudean Severance, MD  albuterol (PROVENTIL) (2.5 MG/3ML) 0.083% nebulizer solution Take 3 mLs (2.5 mg total) by nebulization every 6 (six) hours as needed for wheezing or shortness of breath. 11/12/18   Claudean Severance, MD  cetirizine (ZYRTEC) 10 MG tablet Take 1 tablet (10 mg total) by mouth daily. 11/12/18   Claudean Severance, MD  metroNIDAZOLE (FLAGYL) 500 MG tablet Take 1 tablet (500 mg total) by mouth 3 (three) times daily. Patient not taking: Reported on 11/12/2018 10/13/18   Sharyn Creamer, MD  predniSONE (DELTASONE) 20 MG tablet Take 2 tablets (40 mg total) by mouth daily with breakfast. 11/12/18   Claudean Severance, MD    Family History No family history on file.  Social History Social History   Tobacco Use  . Smoking status: Never Smoker  . Smokeless tobacco: Never Used  Substance Use Topics  . Alcohol use: No  . Drug use: No  Allergies   Eggs or egg-derived products; Montelukast sodium; Other; and Penicillins   Review of Systems Review of Systems  Constitutional: Negative for appetite change, diaphoresis, fatigue and fever.  HENT: Negative for rhinorrhea, sinus pressure and sinus pain.   Respiratory: Positive for chest tightness, shortness of breath and wheezing.   Cardiovascular: Negative for chest pain and palpitations.  Gastrointestinal: Positive for diarrhea. Negative for abdominal pain, constipation, nausea and vomiting.  Musculoskeletal: Negative for arthralgias and myalgias.  Skin: Negative for pallor.  All other systems reviewed and are negative.    Physical  Exam Updated Vital Signs BP (!) 141/98 (BP Location: Right Arm)   Pulse 83   Temp 98.1 F (36.7 C) (Oral)   Resp 16   Ht 5\' 6"  (1.676 m)   Wt 95.2 kg   LMP 10/23/2018 (Exact Date)   SpO2 96%   BMI 33.89 kg/m   Physical Exam  Constitutional: She is oriented to person, place, and time. She appears well-developed and well-nourished. No distress.  HENT:  Head: Normocephalic and atraumatic.  Mouth/Throat: No oropharyngeal exudate.  Eyes: Pupils are equal, round, and reactive to light. EOM are normal.  Neck: Normal range of motion. Neck supple.  Cardiovascular: Normal rate, regular rhythm and normal heart sounds.  Pulmonary/Chest: She has wheezes (diffuse wheezing throughout lung fields). She exhibits no tenderness.  Abdominal: Soft. Bowel sounds are normal. There is no tenderness.  Lymphadenopathy:    She has no cervical adenopathy.  Neurological: She is alert and oriented to person, place, and time.  Skin: Skin is warm and dry.  Psychiatric: She has a normal mood and affect. Judgment normal.     ED Treatments / Results  Labs (all labs ordered are listed, but only abnormal results are displayed) Labs Reviewed - No data to display  EKG None  Radiology Dg Chest 2 View  Result Date: 11/12/2018 CLINICAL DATA:  Cough for 2 weeks. EXAM: CHEST - 2 VIEW COMPARISON:  PA and lateral chest 10/24/2018 and 02/16/2016. FINDINGS: The lungs are clear. Heart size is normal. There is no pneumothorax or pleural fluid. No acute or focal bony abnormality. IMPRESSION: Negative chest. Electronically Signed   By: Drusilla Kanner M.D.   On: 11/12/2018 11:17    Procedures Procedures (including critical care time)  Medications Ordered in ED Medications  albuterol (PROVENTIL) (2.5 MG/3ML) 0.083% nebulizer solution 5 mg (5 mg Nebulization Given 11/12/18 1014)  predniSONE (DELTASONE) tablet 40 mg (40 mg Oral Given 11/12/18 1159)  albuterol (PROVENTIL HFA;VENTOLIN HFA) 108 (90 Base) MCG/ACT  inhaler 2 puff (2 puffs Inhalation Given 11/12/18 1201)     Initial Impression / Assessment and Plan / ED Course  I have reviewed the triage vital signs and the nursing notes.  Pertinent labs & imaging results that were available during my care of the patient were reviewed by me and considered in my medical decision making (see chart for details).     This is a 22 year old female with a history of asthma who presented with a 2 week history of increased wheezing, shortness of breath, chest tightness,a nd productive cough. She reports that she went to [facility] and had a cxr that was normal. She reports that she has this often around this time of year, has a history of allergies. No other precipitating factors such as infection. Vitals show some hypertension, O2 around 95% on RA, RR 16. Will obtain CXR to assess. Patient received a breathing treatment which she reports helped.  CXR showed no acute signs of pneumonia or infection. This is most likely an asthma exacerbation 2/2 seasonal allergies. She was given another breathing treatment which she reports helped. Will prescribe a 5 day course of prednisone, refilled nebulizer, inhaler, and started her on zyrtec. Advised patient to follow up with a primary care provider to discuss starting her on other asthma medications.   Final Clinical Impressions(s) / ED Diagnoses   Final diagnoses:  Moderate persistent asthma with exacerbation    ED Discharge Orders         Ordered    albuterol (PROVENTIL HFA;VENTOLIN HFA) 108 (90 Base) MCG/ACT inhaler  Every 4 hours PRN     11/12/18 1214    albuterol (PROVENTIL) (2.5 MG/3ML) 0.083% nebulizer solution  Every 6 hours PRN     11/12/18 1214    predniSONE (DELTASONE) 20 MG tablet  Daily with breakfast     11/12/18 1214    cetirizine (ZYRTEC) 10 MG tablet  Daily     11/12/18 1214           Claudean SeveranceKrienke, Danikah Budzik M, MD 11/12/18 1223    Tilden Fossaees, Elizabeth, MD 11/14/18 1101

## 2018-11-12 NOTE — ED Notes (Signed)
Pt on the phone the entire time vital signs be taken

## 2018-11-12 NOTE — ED Triage Notes (Signed)
Pt states she was seen recently with similar complains. Pt states that she was "having an asthma attack" and was not given a neb treatment. Pt states that she has been having asthma attacks "every day". Pt states she doesn't have nebs or inhalers at home. Pt states that she also has a cough additionally.

## 2018-12-08 ENCOUNTER — Emergency Department: Payer: Self-pay

## 2018-12-08 ENCOUNTER — Inpatient Hospital Stay
Admission: EM | Admit: 2018-12-08 | Discharge: 2018-12-10 | DRG: 871 | Disposition: A | Discharge status: Home / Self Care | Payer: Self-pay | Attending: Internal Medicine | Admitting: Internal Medicine

## 2018-12-08 ENCOUNTER — Other Ambulatory Visit: Payer: Self-pay

## 2018-12-08 ENCOUNTER — Encounter: Payer: Self-pay | Admitting: Emergency Medicine

## 2018-12-08 DIAGNOSIS — J9601 Acute respiratory failure with hypoxia: Secondary | ICD-10-CM | POA: Diagnosis present

## 2018-12-08 DIAGNOSIS — Z880 Allergy status to penicillin: Secondary | ICD-10-CM

## 2018-12-08 DIAGNOSIS — R0602 Shortness of breath: Secondary | ICD-10-CM

## 2018-12-08 DIAGNOSIS — Z91012 Allergy to eggs: Secondary | ICD-10-CM

## 2018-12-08 DIAGNOSIS — T380X5A Adverse effect of glucocorticoids and synthetic analogues, initial encounter: Secondary | ICD-10-CM | POA: Diagnosis present

## 2018-12-08 DIAGNOSIS — J40 Bronchitis, not specified as acute or chronic: Secondary | ICD-10-CM | POA: Diagnosis present

## 2018-12-08 DIAGNOSIS — Z888 Allergy status to other drugs, medicaments and biological substances status: Secondary | ICD-10-CM

## 2018-12-08 DIAGNOSIS — A419 Sepsis, unspecified organism: Principal | ICD-10-CM | POA: Diagnosis present

## 2018-12-08 DIAGNOSIS — J45901 Unspecified asthma with (acute) exacerbation: Secondary | ICD-10-CM | POA: Diagnosis present

## 2018-12-08 DIAGNOSIS — J4551 Severe persistent asthma with (acute) exacerbation: Secondary | ICD-10-CM | POA: Diagnosis present

## 2018-12-08 LAB — CBC
HCT: 44.2 % (ref 36.0–46.0)
Hemoglobin: 14.4 g/dL (ref 12.0–15.0)
MCH: 29.8 pg (ref 26.0–34.0)
MCHC: 32.6 g/dL (ref 30.0–36.0)
MCV: 91.5 fL (ref 80.0–100.0)
NRBC: 0 % (ref 0.0–0.2)
Platelets: 479 10*3/uL — ABNORMAL HIGH (ref 150–400)
RBC: 4.83 MIL/uL (ref 3.87–5.11)
RDW: 12.9 % (ref 11.5–15.5)
WBC: 14.9 10*3/uL — ABNORMAL HIGH (ref 4.0–10.5)

## 2018-12-08 LAB — BASIC METABOLIC PANEL
ANION GAP: 8 (ref 5–15)
BUN: 10 mg/dL (ref 6–20)
CALCIUM: 8.9 mg/dL (ref 8.9–10.3)
CO2: 25 mmol/L (ref 22–32)
Chloride: 106 mmol/L (ref 98–111)
Creatinine, Ser: 0.81 mg/dL (ref 0.44–1.00)
Glucose, Bld: 91 mg/dL (ref 70–99)
POTASSIUM: 3.5 mmol/L (ref 3.5–5.1)
Sodium: 139 mmol/L (ref 135–145)

## 2018-12-08 LAB — INFLUENZA PANEL BY PCR (TYPE A & B)
Influenza A By PCR: NEGATIVE
Influenza B By PCR: NEGATIVE

## 2018-12-08 LAB — TROPONIN I

## 2018-12-08 LAB — POCT PREGNANCY, URINE: Preg Test, Ur: NEGATIVE

## 2018-12-08 MED ORDER — IPRATROPIUM-ALBUTEROL 0.5-2.5 (3) MG/3ML IN SOLN
3.0000 mL | Freq: Once | RESPIRATORY_TRACT | Status: AC
  Administered 2018-12-08: 3 mL via RESPIRATORY_TRACT

## 2018-12-08 MED ORDER — ALBUTEROL SULFATE (2.5 MG/3ML) 0.083% IN NEBU
2.5000 mg | INHALATION_SOLUTION | RESPIRATORY_TRACT | Status: DC | PRN
  Administered 2018-12-08: 2.5 mg via RESPIRATORY_TRACT
  Filled 2018-12-08: qty 3

## 2018-12-08 MED ORDER — ENOXAPARIN SODIUM 40 MG/0.4ML ~~LOC~~ SOLN
40.0000 mg | SUBCUTANEOUS | Status: DC
  Administered 2018-12-08 – 2018-12-09 (×2): 40 mg via SUBCUTANEOUS
  Filled 2018-12-08 (×2): qty 0.4

## 2018-12-08 MED ORDER — ALBUTEROL SULFATE (2.5 MG/3ML) 0.083% IN NEBU
5.0000 mg | INHALATION_SOLUTION | Freq: Once | RESPIRATORY_TRACT | Status: AC
  Administered 2018-12-08: 5 mg via RESPIRATORY_TRACT

## 2018-12-08 MED ORDER — IBUPROFEN 400 MG PO TABS: 400.0000 mg | ORAL_TABLET | Freq: Four times a day (QID) | ORAL | Status: DC | PRN

## 2018-12-08 MED ORDER — POLYETHYLENE GLYCOL 3350 17 G PO PACK: 17.0000 g | PACK | Freq: Every day | ORAL | Status: DC | PRN

## 2018-12-08 MED ORDER — BUDESONIDE 0.25 MG/2ML IN SUSP
0.2500 mg | Freq: Two times a day (BID) | RESPIRATORY_TRACT | Status: DC
  Administered 2018-12-08 – 2018-12-09 (×2): 0.25 mg via RESPIRATORY_TRACT
  Filled 2018-12-08 (×2): qty 2

## 2018-12-08 MED ORDER — ONDANSETRON HCL 4 MG PO TABS: 4.0000 mg | ORAL_TABLET | Freq: Four times a day (QID) | ORAL | Status: DC | PRN

## 2018-12-08 MED ORDER — ACETAMINOPHEN 325 MG PO TABS
650.0000 mg | ORAL_TABLET | Freq: Four times a day (QID) | ORAL | Status: DC | PRN
  Administered 2018-12-09: 650 mg via ORAL
  Filled 2018-12-08: qty 2

## 2018-12-08 MED ORDER — METHYLPREDNISOLONE SODIUM SUCC 125 MG IJ SOLR
60.0000 mg | Freq: Two times a day (BID) | INTRAMUSCULAR | Status: DC
  Administered 2018-12-08 – 2018-12-09 (×2): 60 mg via INTRAVENOUS
  Filled 2018-12-08 (×2): qty 2

## 2018-12-08 MED ORDER — IPRATROPIUM-ALBUTEROL 0.5-2.5 (3) MG/3ML IN SOLN
RESPIRATORY_TRACT | Status: AC
  Administered 2018-12-08: 3 mL via RESPIRATORY_TRACT
  Filled 2018-12-08: qty 6

## 2018-12-08 MED ORDER — HYDROCOD POLST-CPM POLST ER 10-8 MG/5ML PO SUER: 5.0000 mL | Freq: Two times a day (BID) | ORAL | Status: DC | PRN

## 2018-12-08 MED ORDER — IPRATROPIUM-ALBUTEROL 0.5-2.5 (3) MG/3ML IN SOLN
3.0000 mL | RESPIRATORY_TRACT | Status: DC
  Administered 2018-12-08 – 2018-12-10 (×11): 3 mL via RESPIRATORY_TRACT
  Filled 2018-12-08 (×13): qty 3

## 2018-12-08 MED ORDER — ALBUTEROL SULFATE (2.5 MG/3ML) 0.083% IN NEBU
INHALATION_SOLUTION | RESPIRATORY_TRACT | Status: AC
  Administered 2018-12-08: 5 mg via RESPIRATORY_TRACT
  Filled 2018-12-08: qty 6

## 2018-12-08 MED ORDER — ONDANSETRON HCL 4 MG/2ML IJ SOLN
4.0000 mg | Freq: Four times a day (QID) | INTRAMUSCULAR | Status: DC | PRN
  Filled 2018-12-08: qty 2

## 2018-12-08 MED ORDER — ACETAMINOPHEN 650 MG RE SUPP: 650.0000 mg | Freq: Four times a day (QID) | RECTAL | Status: DC | PRN

## 2018-12-08 MED ORDER — METHYLPREDNISOLONE SODIUM SUCC 125 MG IJ SOLR
125.0000 mg | Freq: Once | INTRAMUSCULAR | Status: AC
  Administered 2018-12-08: 125 mg via INTRAVENOUS
  Filled 2018-12-08: qty 2

## 2018-12-08 NOTE — ED Provider Notes (Signed)
[facility] Emergency Department Provider Note   ____________________________________________    I have reviewed the triage vital signs and the nursing notes.   HISTORY  Chief Complaint Shortness of Breath     HPI Kathryn Nash is a 23 y.o. female who presents with complaints of shortness of breath.  Patient reports feeling extremely tight and having difficulty catching her breath.  Reported history of asthma but reports multiple exacerbations over the last month with repeat emergency department visits.  Reports compliance with medications.  Denies fevers or chills.  No recent travel.  No calf or swelling.  No diaphoresis.  Occasional dry cough  Past Medical History:  Diagnosis Date  . Asthma     There are no active problems to display for this patient.   History reviewed. No pertinent surgical history.  Prior to Admission medications   Medication Sig Start Date End Date Taking? Authorizing Provider  albuterol (PROVENTIL HFA;VENTOLIN HFA) 108 (90 Base) MCG/ACT inhaler Inhale 2 puffs into the lungs every 4 (four) hours as needed for wheezing or shortness of breath. 11/12/18   Claudean SeveranceKrienke, Marissa M, MD  albuterol (PROVENTIL) (2.5 MG/3ML) 0.083% nebulizer solution Take 3 mLs (2.5 mg total) by nebulization every 6 (six) hours as needed for wheezing or shortness of breath. 11/12/18   Claudean SeveranceKrienke, Marissa M, MD  cetirizine (ZYRTEC) 10 MG tablet Take 1 tablet (10 mg total) by mouth daily. 11/12/18   Claudean SeveranceKrienke, Marissa M, MD  guaiFENesin (MUCINEX PO) Take 2 tablets by mouth daily.    [provider]  metroNIDAZOLE (FLAGYL) 500 MG tablet Take 1 tablet (500 mg total) by mouth 3 (three) times daily. Patient not taking: Reported on 11/12/2018 10/13/18   Sharyn CreamerQuale, Mark, MD  predniSONE (DELTASONE) 20 MG tablet Take 2 tablets (40 mg total) by mouth daily with breakfast. 11/12/18   Claudean SeveranceKrienke, Marissa M, MD  Pseudoeph-Doxylamine-DM-APAP (NYQUIL PO) Take 2 tablets by  mouth daily.    [provider]     Allergies Eggs or egg-derived products; Montelukast sodium; and Penicillins  No family history on file.  Social History Social History   Tobacco Use  . Smoking status: Never Smoker  . Smokeless tobacco: Never Used  Substance Use Topics  . Alcohol use: No  . Drug use: No    Review of Systems  Constitutional: No fever/chills Eyes: No visual changes.  ENT: No sore throat. Cardiovascular: As above Respiratory: As above Gastrointestinal: No abdominal pain.  No nausea, no vomiting.   Genitourinary: Negative for dysuria. Musculoskeletal: Negative for back pain. Skin: Negative for rash. Neurological: Negative for headaches   ____________________________________________   PHYSICAL EXAM:  VITAL SIGNS: ED Triage Vitals [12/08/18 1022]  Enc Vitals Group     BP 116/75     Pulse Rate 94     Resp (!) 24     Temp 98.5 F (36.9 C)     Temp Source Oral     SpO2 96 %     Weight 90.7 kg (200 lb)     Height 1.676 m (5\' 6" )     Head Circumference      Peak Flow      Pain Score 0     Pain Loc      Pain Edu?      Excl. in GC?     Constitutional: Alert and oriented. Eyes: Conjunctivae are normal.   Nose: No congestion/rhinnorhea. Mouth/Throat: Mucous membranes are moist.    Cardiovascular: Normal rate, regular rhythm. Grossly  normal heart sounds.   Lungs: Significantly increased work of breathing with tachypnea but no retractions.  Diffuse wheezing Gastrointestinal: Soft and nontender. No distention. .  Musculoskeletal: No lower extremity tenderness nor edema.  Warm and well perfused Neurologic:  Normal speech and language. No gross focal neurologic deficits are appreciated.  Skin:  Skin is warm, dry and intact. No rash noted. Psychiatric: Mood and affect are normal. Speech and behavior are normal.  ____________________________________________   LABS (all labs ordered are listed, but only abnormal results are  displayed)  Labs Reviewed  CBC - Abnormal; Notable for the following components:      Result Value   WBC 14.9 (*)    Platelets 479 (*)    All other components within normal limits  BASIC METABOLIC PANEL  TROPONIN I  POC URINE PREG, ED  POCT PREGNANCY, URINE   ____________________________________________  EKG  ED ECG REPORT I, Jene Everyobert Ilia Dimaano, the attending physician, personally viewed and interpreted this ECG.  Date: 12/08/2018  Rhythm: normal sinus rhythm QRS Axis: normal Intervals: normal ST/T Wave abnormalities: normal Narrative Interpretation: no evidence of acute ischemia  ____________________________________________  RADIOLOGY  Chest x-ray for pneumonia ____________________________________________   PROCEDURES  Procedure(s) performed: No  Procedures   Critical Care performed: No ____________________________________________   INITIAL IMPRESSION / ASSESSMENT AND PLAN / ED COURSE  Pertinent labs & imaging results that were available during my care of the patient were reviewed by me and considered in my medical decision making (see chart for details).  Patient with significant increased work of breathing, wheezing, oxygen saturation 92% on room air.  Chest x-ray negative for pneumonia.  Suspect significant asthma exacerbation, will treat with multiple duo nebs, Solu-Medrol and consider magnesium.  Patient may require admission   ----------------------------------------- 1:11 PM on 12/08/2018 -----------------------------------------  Only minimal improvement with treatment, patient still wheezing diffusely, will admit to the hospital service    ____________________________________________   FINAL CLINICAL IMPRESSION(S) / ED DIAGNOSES  Final diagnoses:  Severe persistent asthma with exacerbation  Shortness of breath        Note:  This document was prepared using Dragon voice recognition software and may include unintentional dictation  errors.   Jene EveryKinner, Ibeth Fahmy, MD 12/08/18 1312

## 2018-12-08 NOTE — ED Notes (Signed)
Called 2C and inquired about someone coming to get patient to transfer upstairs. She states she will tell RN again

## 2018-12-08 NOTE — ED Triage Notes (Signed)
Pt in via POV, reports cough and ongoing shortness of breath x one month.  Reports being seen here for same multiple times without any relief.  Pt with hx of asthma, denies smoking, denies having nebulizers/inhalers at home.  Pt tachypneic upon arrival, other vitals WDL.  Audible wheezing noted.

## 2018-12-08 NOTE — H&P (Signed)
[facility] -  at [facility]   PATIENT NAME: Kathryn Nash    MR#:  [id number]  DATE OF BIRTH:  Aug 22, 1996  DATE OF ADMISSION:  12/08/2018  PRIMARY CARE PHYSICIAN: Patient, No Pcp Per   REQUESTING/REFERRING PHYSICIAN: Dr. Cyril Loosen  CHIEF COMPLAINT:   Chief Complaint  Patient presents with  . Shortness of Breath    HISTORY OF PRESENT ILLNESS:  Kathryn Nash  is a 23 y.o. female with a known history of asthma with recent treatments with albuterol and prednisone with no improvement over a month presents with cough congestion and wheezing again.  Multiple visits to ER this month treated with IV steroids and albuterol.  Patient continues to have shortness of breath.  Today she received IV Solu-Medrol with multiple nebulizers with no improvement.  Continues to have diffuse wheezing and significant shortness of breath.  Oxygen saturations are 95% on room air.  Will be admitted to hospitalist service.  PAST MEDICAL HISTORY:   Past Medical History:  Diagnosis Date  . Asthma     PAST SURGICAL HISTORY:  History reviewed. No pertinent surgical history.  SOCIAL HISTORY:   Social History   Tobacco Use  . Smoking status: Never Smoker  . Smokeless tobacco: Never Used  Substance Use Topics  . Alcohol use: No    FAMILY HISTORY:  No family history on file.  DRUG ALLERGIES:   Allergies  Allergen Reactions  . Eggs Or Egg-Derived Products Hives  . Montelukast Sodium Hives    Singulair  . Penicillins Rash    Has patient had a PCN reaction causing immediate rash, facial/tongue/throat swelling, SOB or lightheadedness with hypotension: Unknown Has patient had a PCN reaction causing severe rash involving mucus membranes or skin necrosis: Unknown Has patient had a PCN reaction that required hospitalization: Unknown Has patient had a PCN reaction occurring within the last 10 years: Unknown If all of the above answers are "NO", then may proceed with Cephalosporin use.      REVIEW OF SYSTEMS:   Review of Systems  Constitutional: Positive for malaise/fatigue. Negative for chills, fever and weight loss.  HENT: Negative for hearing loss and nosebleeds.   Eyes: Negative for blurred vision, double vision and pain.  Respiratory: Positive for cough, shortness of breath and wheezing. Negative for hemoptysis and sputum production.   Cardiovascular: Negative for chest pain, palpitations, orthopnea and leg swelling.  Gastrointestinal: Negative for abdominal pain, constipation, diarrhea, nausea and vomiting.  Genitourinary: Negative for dysuria and hematuria.  Musculoskeletal: Negative for back pain, falls and myalgias.  Skin: Negative for rash.  Neurological: Negative for dizziness, tremors, sensory change, speech change, focal weakness, seizures and headaches.  Endo/Heme/Allergies: Does not bruise/bleed easily.  Psychiatric/Behavioral: Negative for depression and memory loss. The patient is not nervous/anxious.     MEDICATIONS AT HOME:   Prior to Admission medications   Medication Sig Start Date End Date Taking? Authorizing Provider  albuterol (PROVENTIL HFA;VENTOLIN HFA) 108 (90 Base) MCG/ACT inhaler Inhale 2 puffs into the lungs every 4 (four) hours as needed for wheezing or shortness of breath. 11/12/18   Claudean Severance, MD  albuterol (PROVENTIL) (2.5 MG/3ML) 0.083% nebulizer solution Take 3 mLs (2.5 mg total) by nebulization every 6 (six) hours as needed for wheezing or shortness of breath. 11/12/18   Claudean Severance, MD  cetirizine (ZYRTEC) 10 MG tablet Take 1 tablet (10 mg total) by mouth daily. 11/12/18   Claudean Severance, MD  guaiFENesin (MUCINEX PO) Take 2 tablets by mouth  daily.    [provider]  metroNIDAZOLE (FLAGYL) 500 MG tablet Take 1 tablet (500 mg total) by mouth 3 (three) times daily. Patient not taking: Reported on 11/12/2018 10/13/18   Sharyn CreamerQuale, Mark, MD  predniSONE (DELTASONE) 20 MG tablet Take 2 tablets (40 mg total) by  mouth daily with breakfast. 11/12/18   Claudean SeveranceKrienke, Marissa M, MD  Pseudoeph-Doxylamine-DM-APAP (NYQUIL PO) Take 2 tablets by mouth daily.    [provider]     VITAL SIGNS:  Blood pressure 116/75, pulse 94, temperature 98.5 F (36.9 C), temperature source Oral, resp. rate (!) 24, height 5\' 6"  (1.676 m), weight 90.7 kg, last menstrual period 11/10/2018, SpO2 96 %.  PHYSICAL EXAMINATION:  Physical Exam  GENERAL:  23 y.o.-year-old patient lying in the bed with conversational dyspnea EYES: Pupils equal, round, reactive to light and accommodation. No scleral icterus. Extraocular muscles intact.  HEENT: Head atraumatic, normocephalic. Oropharynx and nasopharynx clear. No oropharyngeal erythema, moist oral mucosa  NECK:  Supple, no jugular venous distention. No thyroid enlargement, no tenderness.  LUNGS: Decreased air entry in bilateral wheezing CARDIOVASCULAR: S1, S2 . No murmurs, rubs, or gallops.  Tachycardia ABDOMEN: Soft, nontender, nondistended. Bowel sounds present. No organomegaly or mass.  EXTREMITIES: No pedal edema, cyanosis, or clubbing. + 2 pedal & radial pulses b/l.   NEUROLOGIC: Cranial nerves II through XII are intact. No focal Motor or sensory deficits appreciated b/l PSYCHIATRIC: The patient is alert and oriented x 3. Good affect.  SKIN: No obvious rash, lesion, or ulcer.   LABORATORY PANEL:   CBC Recent Labs  Lab 12/08/18 1025  WBC 14.9*  HGB 14.4  HCT 44.2  PLT 479*   ------------------------------------------------------------------------------------------------------------------  Chemistries  Recent Labs  Lab 12/08/18 1025  NA 139  K 3.5  CL 106  CO2 25  GLUCOSE 91  BUN 10  CREATININE 0.81  CALCIUM 8.9   ------------------------------------------------------------------------------------------------------------------  Cardiac Enzymes Recent Labs  Lab 12/08/18 1025  TROPONINI <0.03    ------------------------------------------------------------------------------------------------------------------  RADIOLOGY:  Dg Chest 2 View  Result Date: 12/08/2018 CLINICAL DATA:  Short of breath and cough EXAM: CHEST - 2 VIEW COMPARISON:  11/12/2018 FINDINGS: The heart size and mediastinal contours are within normal limits. Both lungs are clear. The visualized skeletal structures are unremarkable. IMPRESSION: No active cardiopulmonary disease. Electronically Signed   By: Marlan Palauharles  Clark M.D.   On: 12/08/2018 11:05     IMPRESSION AND PLAN:   *Acute asthma exacerbation with sepsis secondary to bronchitis We will treat with IV fluids, nebulizers, IV steroids ordered.  Influenza checked and negative.  Chest x-ray shows no infiltrates. We will request pulmonary consultation due to recurrent ER visits and no improvement in over a year.  Discussed with Dr. Belia HemanKasa of pulmonary.  Consult placed.    *Leukocytosis secondary to sepsis or recent prednisone use.  All the records are reviewed and case discussed with ED provider. Management plans discussed with the patient, family and they are in agreement.  CODE STATUS: Full code  TOTAL TIME TAKING CARE OF THIS PATIENT: 40 minutes.   Molinda BailiffSrikar R Safal Halderman M.D on 12/08/2018 at 1:36 PM  Between 7am to 6pm - Pager - [phone]  After 6pm go to [email or web address] - password [email or web address]  [facility]  Office  [phone]  CC: Primary care physician; Patient, No Pcp Per  Note: This dictation was prepared with Dragon dictation along with smaller phrase technology. Any transcriptional errors that result from this process are unintentional.

## 2018-12-08 NOTE — ED Notes (Addendum)
Attempted to draw blood from pt but was unsuccessful. Pt stated "usually they have to use the light to get my blood." Melanie,EDT to triage to attempt.

## 2018-12-08 NOTE — ED Notes (Signed)
Pt given meal tray.

## 2018-12-08 NOTE — ED Notes (Signed)
Called dietary and they will send up meal tray for patient. 

## 2018-12-09 DIAGNOSIS — J45901 Unspecified asthma with (acute) exacerbation: Secondary | ICD-10-CM

## 2018-12-09 LAB — CBC
HCT: 43.1 % (ref 36.0–46.0)
Hemoglobin: 14 g/dL (ref 12.0–15.0)
MCH: 29.2 pg (ref 26.0–34.0)
MCHC: 32.5 g/dL (ref 30.0–36.0)
MCV: 90 fL (ref 80.0–100.0)
Platelets: 474 10*3/uL — ABNORMAL HIGH (ref 150–400)
RBC: 4.79 MIL/uL (ref 3.87–5.11)
RDW: 12.8 % (ref 11.5–15.5)
WBC: 27.5 10*3/uL — ABNORMAL HIGH (ref 4.0–10.5)
nRBC: 0 % (ref 0.0–0.2)

## 2018-12-09 LAB — BASIC METABOLIC PANEL
Anion gap: 10 (ref 5–15)
BUN: 10 mg/dL (ref 6–20)
CO2: 20 mmol/L — AB (ref 22–32)
Calcium: 9.6 mg/dL (ref 8.9–10.3)
Chloride: 107 mmol/L (ref 98–111)
Creatinine, Ser: 0.69 mg/dL (ref 0.44–1.00)
GFR calc Af Amer: >60 mL/min (ref >60)
GFR calc non Af Amer: >60 mL/min (ref >60)
Glucose, Bld: 152 mg/dL — ABNORMAL HIGH (ref 70–99)
Potassium: 4.1 mmol/L (ref 3.5–5.1)
Sodium: 137 mmol/L (ref 135–145)

## 2018-12-09 LAB — URINE DRUG SCREEN, QUALITATIVE (ARMC ONLY)
AMPHETAMINES, UR SCREEN: NOT DETECTED
Barbiturates, Ur Screen: NOT DETECTED
Benzodiazepine, Ur Scrn: NOT DETECTED
Cannabinoid 50 Ng, Ur ~~LOC~~: NOT DETECTED
Cocaine Metabolite,Ur ~~LOC~~: NOT DETECTED
MDMA (Ecstasy)Ur Screen: NOT DETECTED
Methadone Scn, Ur: NOT DETECTED
Opiate, Ur Screen: NOT DETECTED
Phencyclidine (PCP) Ur S: NOT DETECTED
Tricyclic, Ur Screen: NOT DETECTED

## 2018-12-09 LAB — PREGNANCY, URINE: Preg Test, Ur: NEGATIVE

## 2018-12-09 MED ORDER — LORATADINE 10 MG PO TABS
10.0000 mg | ORAL_TABLET | Freq: Every day | ORAL | Status: DC
  Administered 2018-12-09 – 2018-12-10 (×2): 10 mg via ORAL
  Filled 2018-12-09 (×2): qty 1

## 2018-12-09 MED ORDER — METHYLPREDNISOLONE SODIUM SUCC 40 MG IJ SOLR
40.0000 mg | Freq: Two times a day (BID) | INTRAMUSCULAR | Status: DC
  Administered 2018-12-09 – 2018-12-10 (×2): 40 mg via INTRAVENOUS
  Filled 2018-12-09 (×2): qty 1

## 2018-12-09 MED ORDER — MOMETASONE FURO-FORMOTEROL FUM 200-5 MCG/ACT IN AERO
2.0000 | INHALATION_SPRAY | Freq: Two times a day (BID) | RESPIRATORY_TRACT | Status: DC
  Administered 2018-12-09 – 2018-12-10 (×3): 2 via RESPIRATORY_TRACT
  Filled 2018-12-09: qty 8.8

## 2018-12-09 MED ORDER — BUDESONIDE 0.5 MG/2ML IN SUSP
0.5000 mg | Freq: Two times a day (BID) | RESPIRATORY_TRACT | Status: DC
  Administered 2018-12-09 – 2018-12-10 (×2): 0.5 mg via RESPIRATORY_TRACT
  Filled 2018-12-09 (×2): qty 2

## 2018-12-09 NOTE — Consult Note (Signed)
Name: Kathryn Nash MRN: [id number] DOB: 12-Sep-1996     CONSULTATION DATE: 12/09/2018 REFERRING MD : Juliene Pina  CHIEF COMPLAINT: asthma attack  STUDIES:  1.6.2020    CXR independently reviewed by Me No acute process No pneumonia    HISTORY OF PRESENT ILLNESS: 23 yo with dx of ASTHMA Dx as child with ASTHMA Chronic intermittent wheezing and cough Chronic nasal congestion  Multiple doctors visits as chils  Used only ALB INH while growing up  +allergies to dust, pollen grass, dogs +exposure to dogs all the time  Multiple ER visits for ASTHMA exacerbation  Feels bad, still with cough +wheezing   PAST MEDICAL HISTORY :   has a past medical history of Asthma.  has no past surgical history on file. Prior to Admission medications   Medication Sig Start Date End Date Taking? Authorizing Provider  albuterol (PROVENTIL HFA;VENTOLIN HFA) 108 (90 Base) MCG/ACT inhaler Inhale 2 puffs into the lungs every 4 (four) hours as needed for wheezing or shortness of breath. 11/12/18  Yes Claudean Severance, MD  albuterol (PROVENTIL) (2.5 MG/3ML) 0.083% nebulizer solution Take 3 mLs (2.5 mg total) by nebulization every 6 (six) hours as needed for wheezing or shortness of breath. 11/12/18  Yes Claudean Severance, MD  cetirizine (ZYRTEC) 10 MG tablet Take 1 tablet (10 mg total) by mouth daily. Patient not taking: Reported on 12/08/2018 11/12/18   Claudean Severance, MD  metroNIDAZOLE (FLAGYL) 500 MG tablet Take 1 tablet (500 mg total) by mouth 3 (three) times daily. Patient not taking: Reported on 11/12/2018 10/13/18   Sharyn Creamer, MD  predniSONE (DELTASONE) 20 MG tablet Take 2 tablets (40 mg total) by mouth daily with breakfast. Patient not taking: Reported on 12/08/2018 11/12/18   Claudean Severance, MD   Allergies  Allergen Reactions  . Eggs Or Egg-Derived Products Hives  . Montelukast Sodium Hives    Singulair  . Penicillins Rash    Has patient had a PCN reaction causing immediate rash,  facial/tongue/throat swelling, SOB or lightheadedness with hypotension: Unknown Has patient had a PCN reaction causing severe rash involving mucus membranes or skin necrosis: Unknown Has patient had a PCN reaction that required hospitalization: Unknown Has patient had a PCN reaction occurring within the last 10 years: Unknown If all of the above answers are "NO", then may proceed with Cephalosporin use.     FAMILY HISTORY:  family history is not on file. SOCIAL HISTORY:  reports that she has never smoked. She has never used smokeless tobacco. She reports that she does not drink alcohol or use drugs.  REVIEW OF SYSTEMS:   Constitutional: Negative for fever, chills, weight loss, malaise/fatigue and diaphoresis.  HENT: Negative for hearing loss, ear pain, nosebleeds, +congestion, sore throat, neck pain, tinnitus and ear discharge.   Eyes: Negative for blurred vision, double vision, photophobia, pain, discharge and redness.  Respiratory: +cough, -hemoptysis, -sputum production, +shortness of breath, +wheezing   Cardiovascular: Negative for chest pain, palpitations, orthopnea, claudication, leg swelling and PND.  Gastrointestinal: Negative for heartburn, nausea, vomiting, abdominal pain, diarrhea, constipation, blood in stool and melena.  Genitourinary: Negative for dysuria, urgency, frequency, hematuria and flank pain.  Musculoskeletal: Negative for myalgias, back pain, joint pain and falls.  Skin: Negative for itching and rash.  Neurological: Negative for dizziness, tingling, tremors, sensory change, speech change, focal weakness, seizures, loss of consciousness, weakness and headaches.  Endo/Heme/Allergies: Negative for environmental allergies and polydipsia. Does not bruise/bleed easily.  ALL OTHER ROS ARE NEGATIVE  VITAL SIGNS: Temp:  [97.7 F (36.5 C)-98.5 F (36.9 C)] 97.7 F (36.5 C) (01/07 0335) Pulse Rate:  [94-129] 103 (01/07 0335) Resp:  [18-24] 20 (01/07 0335) BP:  (116-136)/(62-77) 130/62 (01/07 0335) SpO2:  [91 %-96 %] 92 % (01/07 0351) Weight:  [90.7 kg] 90.7 kg (01/06 1022)  Physical Examination:   GENERAL:NAD, no fevers, chills, no weakness no fatigue HEAD: Normocephalic, atraumatic.  EYES: Pupils equal, round, reactive to light. Extraocular muscles intact. No scleral icterus.  MOUTH: Moist mucosal membrane.   EAR, NOSE, THROAT: Clear without exudates. No external lesions.  NECK: Supple. No thyromegaly. No nodules. No JVD.  PULMONARY:CTA B/L + wheezes, no crackles, no rhonchi CARDIOVASCULAR: S1 and S2. Regular rate and rhythm. No murmurs, rubs, or gallops. No edema.  GASTROINTESTINAL: Soft, nontender, nondistended. No masses. Positive bowel sounds.  MUSCULOSKELETAL: No swelling, clubbing, or edema. Range of motion full in all extremities.  NEUROLOGIC: Cranial nerves II through XII are intact. No gross focal neurological deficits.  SKIN: No ulceration, lesions, rashes, or cyanosis. Skin warm and dry. Turgor intact.  PSYCHIATRIC: Mood, affect within normal limits. The patient is awake, alert and oriented x 3. Insight, judgment intact.      ASSESSMENT / PLAN: Acute ASTHMA exacerbation from ongoing environmental exposure to dogs with allergic rhinitis   1.continue steroids as prescribed 2.dounebs every 4 hrs 3.pulmicort nebs BID 4.start claritan 5.start DULERA BID 6.avoid dogs 7.check UCG and urine drug test  Recommend 1-2 more days of inpatient therapy      Patient satisfied with Plan of action and management. All questions answered  Lucie Leather, M.D.  [facility]  Medical Director [facility] Medical Director [facility] Cardio-Pulmonary Department

## 2018-12-09 NOTE — Progress Notes (Signed)
[employer] - [place] at [facility]   PATIENT NAME: Kathryn Nash    MR#:  [id number]  DATE OF BIRTH:  [birth year]  SUBJECTIVE:   Patient here with shortness of breath.  She only uses rescue inhaler.  She does not have a long acting inhaler.  REVIEW OF SYSTEMS:    Review of Systems  Constitutional: Negative for fever, chills weight loss HENT: Negative for ear pain, nosebleeds, congestion, facial swelling, rhinorrhea, neck pain, neck stiffness and ear discharge.   Respiratory: ++ for cough, shortness of breath, wheezing  Cardiovascular: Negative for chest pain, palpitations and leg swelling.  Gastrointestinal: Negative for heartburn, abdominal pain, vomiting, diarrhea or consitpation Genitourinary: Negative for dysuria, urgency, frequency, hematuria Musculoskeletal: Negative for back pain or joint pain Neurological: Negative for dizziness, seizures, syncope, focal weakness,  numbness and headaches.  Hematological: Does not bruise/bleed easily.  Psychiatric/Behavioral: Negative for hallucinations, confusion, dysphoric mood    Tolerating Diet: yes      DRUG ALLERGIES:   Allergies  Allergen Reactions  . Eggs Or Egg-Derived Products Hives  . Montelukast Sodium Hives    Singulair  . Penicillins Rash    Has patient had a PCN reaction causing immediate rash, facial/tongue/throat swelling, SOB or lightheadedness with hypotension: Unknown Has patient had a PCN reaction causing severe rash involving mucus membranes or skin necrosis: Unknown Has patient had a PCN reaction that required hospitalization: Unknown Has patient had a PCN reaction occurring within the last 10 years: Unknown If all of the above answers are "NO", then may proceed with Cephalosporin use.     VITALS:  Blood pressure 130/62, pulse (!) 103, temperature 97.7 F (36.5 C), temperature source Oral, resp. rate 20, height 5\' 6"  (1.676 m), weight 90.7 kg, last menstrual period 11/10/2018, SpO2 (!) 89  %.  PHYSICAL EXAMINATION:  Constitutional: Appears well-developed and well-nourished. No distress. HENT: Normocephalic. Marland Kitchen. Oropharynx is clear and moist.  Eyes: Conjunctivae and EOM are normal. PERRLA, no scleral icterus.  Neck: Normal ROM. Neck supple. No JVD. No tracheal deviation. CVS: RRR, S1/S2 +, no murmurs, no gallops, no carotid bruit.  Pulmonary: Normal respiratory effort with prolonged bilateral expiratory wheezing Abdominal: Soft. BS +,  no distension, tenderness, rebound or guarding.  Musculoskeletal: Normal range of motion. No edema and no tenderness.  Neuro: Alert. CN 2-12 grossly intact. No focal deficits. Skin: Skin is warm and dry. No rash noted. Psychiatric: Normal mood and affect.      LABORATORY PANEL:   CBC Recent Labs  Lab 12/09/18 0432  WBC 27.5*  HGB 14.0  HCT 43.1  PLT 474*   ------------------------------------------------------------------------------------------------------------------  Chemistries  Recent Labs  Lab 12/09/18 0432  NA 137  K 4.1  CL 107  CO2 20*  GLUCOSE 152*  BUN 10  CREATININE 0.69  CALCIUM 9.6   ------------------------------------------------------------------------------------------------------------------  Cardiac Enzymes Recent Labs  Lab 12/08/18 1025  TROPONINI <0.03   ------------------------------------------------------------------------------------------------------------------  RADIOLOGY:  Dg Chest 2 View  Result Date: 12/08/2018 CLINICAL DATA:  Short of breath and cough EXAM: CHEST - 2 VIEW COMPARISON:  11/12/2018 FINDINGS: The heart size and mediastinal contours are within normal limits. Both lungs are clear. The visualized skeletal structures are unremarkable. IMPRESSION: No active cardiopulmonary disease. Electronically Signed   By: Marlan Palauharles  Clark M.D.   On: 12/08/2018 11:05     ASSESSMENT AND PLAN:   23 year old female with history of asthma who presents with shortness of breath.  1.  Acute  hypoxic respiratory failure in the setting of  acute asthma exacerbation: Wean oxygen to room air  2.  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   Management plans discussed with the patient and she is in agreement.  CODE STATUS: full  TOTAL TIME TAKING CARE OF THIS PATIENT: 30 minutes.    D/w dr Belia Hemankasa  POSSIBLE D/C 1-2 days, DEPENDING ON CLINICAL CONDITION.   Lyllie Cobbins M.D on 12/09/2018 at 12:18 PM  Between 7am to 6pm - Pager - [phone] After 6pm go to [email or web address] - password [email or web address]  [facility] [place] [facility]  Office  [phone]  CC: Primary care physician; Patient, No Pcp Per  Note: This dictation was prepared with Dragon dictation along with smaller phrase technology. Any transcriptional errors that result from this process are unintentional.

## 2018-12-09 NOTE — Progress Notes (Addendum)
Nutrition Brief Note  Patient identified on the Malnutrition Screening Tool (MST) Report  23 y/o female admitted with asthma exacerbation   Wt Readings from Last 15 Encounters:  12/08/18 90.7 kg  11/12/18 95.2 kg  10/24/18 95.3 kg  10/13/18 95.3 kg  07/16/18 90.7 kg  03/17/18 95.3 kg  01/28/18 93.9 kg  12/15/17 90.7 kg  03/03/17 83.9 kg  03/02/17 83.9 kg  11/11/16 81.6 kg  08/15/16 83 kg  02/16/16 87.1 kg (97 %, Z= 1.81)*  11/20/15 78.9 kg (93 %, Z= 1.48)*  05/09/15 68 kg (82 %, Z= 0.91)*   * Growth percentiles are based on CDC (Girls, 2-20 Years) data.    Body mass index is 32.28 kg/m. Patient meets criteria for obesity based on current BMI.   Current diet order is regular, patient is consuming approximately 75% of meals at this time. Labs and medications reviewed.   No nutrition interventions warranted at this time. If nutrition issues arise, please consult RD.   Betsey Holiday MS, RD, LDN Pager #- [phone] Office#- [phone] After Hours Pager: [phone]

## 2018-12-10 LAB — HIV ANTIBODY (ROUTINE TESTING W REFLEX): HIV Screen 4th Generation wRfx: NONREACTIVE

## 2018-12-10 MED ORDER — FLUTICASONE-SALMETEROL 250-50 MCG/DOSE IN AEPB: 1.0000 | INHALATION_SPRAY | Freq: Two times a day (BID) | RESPIRATORY_TRACT | 0 refills | Status: DC

## 2018-12-10 MED ORDER — MOMETASONE FURO-FORMOTEROL FUM 200-5 MCG/ACT IN AERO: 2.0000 | INHALATION_SPRAY | Freq: Two times a day (BID) | RESPIRATORY_TRACT | 0 refills | Status: DC

## 2018-12-10 MED ORDER — PREDNISONE 10 MG PO TABS: 10.0000 mg | ORAL_TABLET | Freq: Every day | ORAL | 0 refills | Status: DC

## 2018-12-10 MED ORDER — LORATADINE 10 MG PO TABS: 10.0000 mg | ORAL_TABLET | Freq: Every day | ORAL | 0 refills | Status: DC

## 2018-12-10 NOTE — Discharge Summary (Addendum)
[facility] [place] [facility]   PATIENT NAME: Kathryn Nash    MR#:  [id number]  DATE OF BIRTH:  04-05-1996  DATE OF ADMISSION:  12/08/2018 ADMITTING PHYSICIAN: Milagros LollSrikar Sudini, MD  DATE OF DISCHARGE: 12/10/2018  PRIMARY CARE PHYSICIAN: Patient, No Pcp Per    ADMISSION DIAGNOSIS:  Shortness of breath [R06.02] Severe persistent asthma with exacerbation [J45.51]  DISCHARGE DIAGNOSIS:  Active Problems:   Asthma exacerbation   SECONDARY DIAGNOSIS:   Past Medical History:  Diagnosis Date  . Asthma     HOSPITAL COURSE:    23 year old female with history of asthma who presents with shortness of breath.  1.  Acute hypoxic respiratory failure in the setting of acute severe persistent asthma exacerbation: She has been weaned to room air  2.  Acute asthma exacerbation: At the time of discharge patient has no wheezing on examination.  Her symptoms have improved.  She was evaluated by pulmonary in the hospital will have outpatient pulmonary follow-up.  She will continue Claritin and long-acting inhaler as recommended by pulmonologist. Sepsis has been ruled out   3 leukocytosis due to steroids  DISCHARGE CONDITIONS AND DIET:   Stable for discharge on regular diet  CONSULTS OBTAINED:    DRUG ALLERGIES:   Allergies  Allergen Reactions  . Eggs Or Egg-Derived Products Hives  . Montelukast Sodium Hives    Singulair  . Penicillins Rash    Has patient had a PCN reaction causing immediate rash, facial/tongue/throat swelling, SOB or lightheadedness with hypotension: Unknown Has patient had a PCN reaction causing severe rash involving mucus membranes or skin necrosis: Unknown Has patient had a PCN reaction that required hospitalization: Unknown Has patient had a PCN reaction occurring within the last 10 years: Unknown If all of the above answers are "NO", then may proceed with Cephalosporin use.     DISCHARGE MEDICATIONS:   Allergies as of 12/10/2018       Reactions   Eggs Or Egg-derived Products Hives   Montelukast Sodium Hives   Singulair   Penicillins Rash   Has patient had a PCN reaction causing immediate rash, facial/tongue/throat swelling, SOB or lightheadedness with hypotension: Unknown Has patient had a PCN reaction causing severe rash involving mucus membranes or skin necrosis: Unknown Has patient had a PCN reaction that required hospitalization: Unknown Has patient had a PCN reaction occurring within the last 10 years: Unknown If all of the above answers are "NO", then may proceed with Cephalosporin use.      Medication List    STOP taking these medications   cetirizine 10 MG tablet Commonly known as:  ZYRTEC   metroNIDAZOLE 500 MG tablet Commonly known as:  FLAGYL     TAKE these medications   albuterol 108 (90 Base) MCG/ACT inhaler Commonly known as:  PROVENTIL HFA;VENTOLIN HFA Inhale 2 puffs into the lungs every 4 (four) hours as needed for wheezing or shortness of breath.   albuterol (2.5 MG/3ML) 0.083% nebulizer solution Commonly known as:  PROVENTIL Take 3 mLs (2.5 mg total) by nebulization every 6 (six) hours as needed for wheezing or shortness of breath.   Fluticasone-Salmeterol 250-50 MCG/DOSE Aepb Commonly known as:  ADVAIR DISKUS Inhale 1 puff into the lungs 2 (two) times daily.   loratadine 10 MG tablet Commonly known as:  CLARITIN Take 1 tablet (10 mg total) by mouth daily. Start taking on:  December 11, 2018   predniSONE 10 MG tablet Commonly known as:  DELTASONE Take 1 tablet (10 mg total)  by mouth daily with breakfast. 40 mg PO (ORAL)  x 2 days 30 mg PO  (ORAL)  x 2 days 20 mg PO  (ORAL) x 2 days 10 mg PO  (ORAL) x 2 days then stop What changed:    medication strength  how much to take  additional instructions         Today   CHIEF COMPLAINT:   Patient doing much better this morning no wheezing or cough   VITAL SIGNS:  Blood pressure (!) 109/52, pulse (!) 102, temperature 98.3  F (36.8 C), temperature source Oral, resp. rate 20, height 5\' 6"  (1.676 m), weight 90.7 kg, last menstrual period 11/10/2018, SpO2 93 %.   REVIEW OF SYSTEMS:  Review of Systems  Constitutional: Negative.  Negative for chills, fever and malaise/fatigue.  HENT: Negative.  Negative for ear discharge, ear pain, hearing loss, nosebleeds and sore throat.   Eyes: Negative.  Negative for blurred vision and pain.  Respiratory: Negative.  Negative for cough, hemoptysis, shortness of breath and wheezing.   Cardiovascular: Negative.  Negative for chest pain, palpitations and leg swelling.  Gastrointestinal: Negative.  Negative for abdominal pain, blood in stool, diarrhea, nausea and vomiting.  Genitourinary: Negative.  Negative for dysuria.  Musculoskeletal: Negative.  Negative for back pain.  Skin: Negative.   Neurological: Negative for dizziness, tremors, speech change, focal weakness, seizures and headaches.  Endo/Heme/Allergies: Negative.  Does not bruise/bleed easily.  Psychiatric/Behavioral: Negative.  Negative for depression, hallucinations and suicidal ideas.     PHYSICAL EXAMINATION:  GENERAL:  23 y.o.-year-old patient lying in the bed with no acute distress.  NECK:  Supple, no jugular venous distention. No thyroid enlargement, no tenderness.  LUNGS: Normal breath sounds bilaterally, no wheezing, rales,rhonchi  No use of accessory muscles of respiration.  CARDIOVASCULAR: S1, S2 normal. No murmurs, rubs, or gallops.  ABDOMEN: Soft, non-tender, non-distended. Bowel sounds present. No organomegaly or mass.  EXTREMITIES: No pedal edema, cyanosis, or clubbing.  PSYCHIATRIC: The patient is alert and oriented x 3.  SKIN: No obvious rash, lesion, or ulcer.   DATA REVIEW:   CBC Recent Labs  Lab 12/09/18 0432  WBC 27.5*  HGB 14.0  HCT 43.1  PLT 474*    Chemistries  Recent Labs  Lab 12/09/18 0432  NA 137  K 4.1  CL 107  CO2 20*  GLUCOSE 152*  BUN 10  CREATININE 0.69  CALCIUM  9.6    Cardiac Enzymes Recent Labs  Lab 12/08/18 1025  TROPONINI <0.03    Microbiology Results  @MICRORSLT48 @  RADIOLOGY:  No results found.    Allergies as of 12/10/2018      Reactions   Eggs Or Egg-derived Products Hives   Montelukast Sodium Hives   Singulair   Penicillins Rash   Has patient had a PCN reaction causing immediate rash, facial/tongue/throat swelling, SOB or lightheadedness with hypotension: Unknown Has patient had a PCN reaction causing severe rash involving mucus membranes or skin necrosis: Unknown Has patient had a PCN reaction that required hospitalization: Unknown Has patient had a PCN reaction occurring within the last 10 years: Unknown If all of the above answers are "NO", then may proceed with Cephalosporin use.      Medication List    STOP taking these medications   cetirizine 10 MG tablet Commonly known as:  ZYRTEC   metroNIDAZOLE 500 MG tablet Commonly known as:  FLAGYL     TAKE these medications   albuterol 108 (90 Base) MCG/ACT inhaler Commonly known  as:  PROVENTIL HFA;VENTOLIN HFA Inhale 2 puffs into the lungs every 4 (four) hours as needed for wheezing or shortness of breath.   albuterol (2.5 MG/3ML) 0.083% nebulizer solution Commonly known as:  PROVENTIL Take 3 mLs (2.5 mg total) by nebulization every 6 (six) hours as needed for wheezing or shortness of breath.   Fluticasone-Salmeterol 250-50 MCG/DOSE Aepb Commonly known as:  ADVAIR DISKUS Inhale 1 puff into the lungs 2 (two) times daily.   loratadine 10 MG tablet Commonly known as:  CLARITIN Take 1 tablet (10 mg total) by mouth daily. Start taking on:  December 11, 2018   predniSONE 10 MG tablet Commonly known as:  DELTASONE Take 1 tablet (10 mg total) by mouth daily with breakfast. 40 mg PO (ORAL)  x 2 days 30 mg PO  (ORAL)  x 2 days 20 mg PO  (ORAL) x 2 days 10 mg PO  (ORAL) x 2 days then stop What changed:    medication strength  how much to take  additional  instructions          Management plans discussed with the patient and she is in agreement. Stable for discharge home  Patient should follow up with dr Belia Hemankasa  CODE STATUS:     Code Status Orders  (From admission, onward)         Start     Ordered   12/08/18 1334  Full code  Continuous     12/08/18 1334        Code Status History    This patient has a current code status but no historical code status.      TOTAL TIME TAKING CARE OF THIS PATIENT: 38 minutes.    Note: This dictation was prepared with Dragon dictation along with smaller phrase technology. Any transcriptional errors that result from this process are unintentional.  Janaria Mccammon M.D on 12/10/2018 at 12:36 PM  Between 7am to 6pm - Pager - [phone] After 6pm go to [email or web address] - password [email or web address]  [facility]  Office  [phone]  CC: Primary care physician; Patient, No Pcp Per

## 2018-12-10 NOTE — Care Management (Signed)
Patient to pick up all prescriptions at discharge for no cost at [facility]   RNCM reviewed and provided application to [facility]  [facility]

## 2018-12-24 ENCOUNTER — Institutional Professional Consult (permissible substitution): Payer: Self-pay | Admitting: Internal Medicine

## 2018-12-25 ENCOUNTER — Encounter: Payer: Self-pay | Admitting: Internal Medicine

## 2019-02-02 ENCOUNTER — Other Ambulatory Visit: Payer: Self-pay

## 2019-02-02 ENCOUNTER — Encounter: Payer: Self-pay | Admitting: Emergency Medicine

## 2019-02-02 DIAGNOSIS — R102 Pelvic and perineal pain: Secondary | ICD-10-CM | POA: Insufficient documentation

## 2019-02-02 DIAGNOSIS — A64 Unspecified sexually transmitted disease: Secondary | ICD-10-CM | POA: Insufficient documentation

## 2019-02-02 DIAGNOSIS — J45909 Unspecified asthma, uncomplicated: Secondary | ICD-10-CM | POA: Insufficient documentation

## 2019-02-02 DIAGNOSIS — Z79899 Other long term (current) drug therapy: Secondary | ICD-10-CM | POA: Insufficient documentation

## 2019-02-02 LAB — COMPREHENSIVE METABOLIC PANEL
ALK PHOS: 65 U/L (ref 38–126)
ALT: 14 U/L (ref 0–44)
AST: 18 U/L (ref 15–41)
Albumin: 4.2 g/dL (ref 3.5–5.0)
Anion gap: 8 (ref 5–15)
BUN: 13 mg/dL (ref 6–20)
CALCIUM: 8.9 mg/dL (ref 8.9–10.3)
CO2: 24 mmol/L (ref 22–32)
Chloride: 110 mmol/L (ref 98–111)
Creatinine, Ser: 0.91 mg/dL (ref 0.44–1.00)
GFR calc Af Amer: >60 mL/min (ref >60)
GFR calc non Af Amer: >60 mL/min (ref >60)
Glucose, Bld: 103 mg/dL — ABNORMAL HIGH (ref 70–99)
Potassium: 3.3 mmol/L — ABNORMAL LOW (ref 3.5–5.1)
Sodium: 142 mmol/L (ref 135–145)
TOTAL PROTEIN: 7.8 g/dL (ref 6.5–8.1)
Total Bilirubin: 0.4 mg/dL (ref 0.3–1.2)

## 2019-02-02 LAB — CBC WITH DIFFERENTIAL/PLATELET
Abs Immature Granulocytes: 0.06 10*3/uL (ref 0.00–0.07)
Basophils Absolute: 0.1 10*3/uL (ref 0.0–0.1)
Basophils Relative: 1 %
Eosinophils Absolute: 1.2 10*3/uL — ABNORMAL HIGH (ref 0.0–0.5)
Eosinophils Relative: 9 %
HEMATOCRIT: 42.6 % (ref 36.0–46.0)
Hemoglobin: 14.3 g/dL (ref 12.0–15.0)
Immature Granulocytes: 0 %
LYMPHS ABS: 3.5 10*3/uL (ref 0.7–4.0)
Lymphocytes Relative: 25 %
MCH: 29.4 pg (ref 26.0–34.0)
MCHC: 33.6 g/dL (ref 30.0–36.0)
MCV: 87.7 fL (ref 80.0–100.0)
MONOS PCT: 8 %
Monocytes Absolute: 1 10*3/uL (ref 0.1–1.0)
Neutro Abs: 7.8 10*3/uL — ABNORMAL HIGH (ref 1.7–7.7)
Neutrophils Relative %: 57 %
Platelets: 452 10*3/uL — ABNORMAL HIGH (ref 150–400)
RBC: 4.86 MIL/uL (ref 3.87–5.11)
RDW: 12.7 % (ref 11.5–15.5)
WBC: 13.6 10*3/uL — ABNORMAL HIGH (ref 4.0–10.5)
nRBC: 0.2 % (ref 0.0–0.2)

## 2019-02-02 LAB — LIPASE, BLOOD: Lipase: 26 U/L (ref 11–51)

## 2019-02-02 LAB — POCT PREGNANCY, URINE: Preg Test, Ur: NEGATIVE

## 2019-02-02 NOTE — ED Triage Notes (Signed)
Patient ambulatory to triage with steady gait, without difficulty or distress noted; pt reports lower abd pain since yesterday with no accomp symptoms 

## 2019-02-03 ENCOUNTER — Emergency Department: Payer: Self-pay

## 2019-02-03 ENCOUNTER — Emergency Department
Admission: EM | Admit: 2019-02-03 | Discharge: 2019-02-03 | Disposition: A | Discharge status: Home / Self Care | Payer: Self-pay | Attending: Emergency Medicine | Admitting: Emergency Medicine

## 2019-02-03 DIAGNOSIS — R102 Pelvic and perineal pain: Secondary | ICD-10-CM

## 2019-02-03 DIAGNOSIS — A64 Unspecified sexually transmitted disease: Secondary | ICD-10-CM

## 2019-02-03 LAB — URINALYSIS, COMPLETE (UACMP) WITH MICROSCOPIC
Bacteria, UA: NONE SEEN
Bilirubin Urine: NEGATIVE
Glucose, UA: NEGATIVE mg/dL
Ketones, ur: NEGATIVE mg/dL
Leukocytes,Ua: NEGATIVE
Nitrite: NEGATIVE
Protein, ur: 30 mg/dL — AB
RBC / HPF: >50 RBC/hpf — ABNORMAL HIGH (ref 0–5)
Specific Gravity, Urine: 1.009 (ref 1.005–1.030)
pH: 5 (ref 5.0–8.0)

## 2019-02-03 MED ORDER — AZITHROMYCIN 500 MG PO TABS
1000.0000 mg | ORAL_TABLET | Freq: Once | ORAL | Status: AC
  Administered 2019-02-03: 1000 mg via ORAL
  Filled 2019-02-03: qty 2

## 2019-02-03 MED ORDER — CEFTRIAXONE SODIUM 250 MG IJ SOLR
250.0000 mg | Freq: Once | INTRAMUSCULAR | Status: AC
  Administered 2019-02-03: 250 mg via INTRAMUSCULAR
  Filled 2019-02-03: qty 250

## 2019-02-03 NOTE — ED Provider Notes (Signed)
[facility] Emergency Department Provider Note ________________________   First MD Initiated Contact with Patient 02/03/19 0246     (approximate)  I have reviewed the triage vital signs and the nursing notes.   HISTORY  Chief Complaint Abdominal Pain    HPI Kathryn Nash is a 23 y.o. female presents to the emergency department with acute onset of pelvic discomfort that is currently 7 out of 10 which began today.  Patient states that duration began yesterday.  Patient states however that she is never had discomfort with her periods in the past.  Patient denies any dysuria no vaginal discharge.  Patient denies any nausea or vomiting.  Patient denies any diarrhea or constipation.  Patient denies any fever        Past Medical History:  Diagnosis Date  . Asthma     Patient Active Problem List   Diagnosis Date Noted  . Asthma exacerbation 12/08/2018    History reviewed. No pertinent surgical history.  Prior to Admission medications   Medication Sig Start Date End Date Taking? Authorizing Provider  albuterol (PROVENTIL HFA;VENTOLIN HFA) 108 (90 Base) MCG/ACT inhaler Inhale 2 puffs into the lungs every 4 (four) hours as needed for wheezing or shortness of breath. 11/12/18   Claudean Severance, MD  albuterol (PROVENTIL) (2.5 MG/3ML) 0.083% nebulizer solution Take 3 mLs (2.5 mg total) by nebulization every 6 (six) hours as needed for wheezing or shortness of breath. 11/12/18   Claudean Severance, MD  Fluticasone-Salmeterol (ADVAIR DISKUS) 250-50 MCG/DOSE AEPB Inhale 1 puff into the lungs 2 (two) times daily. 12/10/18 12/10/19  Adrian Saran, MD  loratadine (CLARITIN) 10 MG tablet Take 1 tablet (10 mg total) by mouth daily. 12/11/18   Adrian Saran, MD  predniSONE (DELTASONE) 10 MG tablet Take 1 tablet (10 mg total) by mouth daily with breakfast. 40 mg PO (ORAL)  x 2 days 30 mg PO  (ORAL)  x 2 days 20 mg PO  (ORAL) x 2 days 10 mg PO  (ORAL) x 2 days then stop 12/10/18    Adrian Saran, MD    Allergies Eggs or egg-derived products; Montelukast sodium; and Penicillins  No family history on file.  Social History Social History   Tobacco Use  . Smoking status: Never Smoker  . Smokeless tobacco: Never Used  Substance Use Topics  . Alcohol use: No  . Drug use: No    Review of Systems Constitutional: No fever/chills Eyes: No visual changes. ENT: No sore throat. Cardiovascular: Denies chest pain. Respiratory: Denies shortness of breath. Gastrointestinal: No abdominal pain.  No nausea, no vomiting.  No diarrhea.  No constipation. Genitourinary: Negative for dysuria.  Positive for vaginal bleeding and pelvic discomfort Musculoskeletal: Negative for neck pain.  Negative for back pain. Integumentary: Negative for rash. Neurological: Negative for headaches, focal weakness or numbness.   ____________________________________________   PHYSICAL EXAM:  VITAL SIGNS: ED Triage Vitals  Enc Vitals Group     BP 02/02/19 2211 140/75     Pulse Rate 02/02/19 2211 94     Resp 02/02/19 2211 16     Temp 02/02/19 2211 99.1 F (37.3 C)     Temp Source 02/02/19 2211 Oral     SpO2 02/02/19 2211 98 %     Weight 02/02/19 2209 104.3 kg (230 lb)     Height 02/02/19 2209 1.676 m ( )     Head Circumference --      Peak Flow --  Pain Score 02/02/19 2209 10     Pain Loc --      Pain Edu? --      Excl. in GC? --     Constitutional: Alert and oriented. Well appearing and in no acute distress. Eyes: Conjunctivae are normal.  Mouth/Throat: Mucous membranes are moist.  Oropharynx non-erythematous. Neck: No stridor.   Cardiovascular: Normal rate, regular rhythm. Good peripheral circulation. Grossly normal heart sounds. Respiratory: Normal respiratory effort.  No retractions. Lungs CTAB. Gastrointestinal: Soft and nontender. No distention.  Genitourinary: Actively menstruating.  No gross abnormality. Musculoskeletal: No lower extremity tenderness nor edema.  No gross deformities of extremities. Neurologic:  Normal speech and language. No gross focal neurologic deficits are appreciated.  Skin:  Skin is warm, dry and intact. No rash noted. Psychiatric: Mood and affect are normal. Speech and behavior are normal. ____________________________________________   LABS (all labs ordered are listed, but only abnormal results are displayed)  Labs Reviewed  CBC WITH DIFFERENTIAL/PLATELET - Abnormal; Notable for the following components:      Result Value   WBC 13.6 (*)    Platelets 452 (*)    Neutro Abs 7.8 (*)    Eosinophils Absolute 1.2 (*)    All other components within normal limits  COMPREHENSIVE METABOLIC PANEL - Abnormal; Notable for the following components:   Potassium 3.3 (*)    Glucose, Bld 103 (*)    All other components within normal limits  URINALYSIS, COMPLETE (UACMP) WITH MICROSCOPIC - Abnormal; Notable for the following components:   Color, Urine PINK (*)    APPearance CLEAR (*)    Hgb urine dipstick LARGE (*)    Protein, ur 30 (*)    RBC / HPF >50 (*)    All other components within normal limits  LIPASE, BLOOD  POCT PREGNANCY, URINE   _________________________________  RADIOLOGY I, Boardman N , personally viewed and evaluated these images (plain radiographs) as part of my medical decision making, as well as reviewing the written report by the radiologist.  ED MD interpretation: Normal pelvic ultrasound per radiologist.  Official radiology report(s): US Pelvis Transvanginal Non-ob (tv Only)  Result Date: 02/03/2019 CLINICAL DATA:  Initial evaluation for acute lower abdominal pain. EXAM: TRANSABDOMINAL AND TRANSVAGINAL ULTRASOUND OF PELVIS TECHNIQUE: Both transabdominal and transvaginal ultrasound examinations of the pelvis were performed. Transabdominal technique was performed for global imaging of the pelvis including uterus, ovaries, adnexal regions, and pelvic cul-de-sac. It was necessary to proceed with endovaginal  exam following the transabdominal exam to visualize the uterus, endometrium, and ovaries. COMPARISON:  Prior CT from 02/16/2016 FINDINGS: Uterus Measurements: 6.2 x 3.9 x 4.9 cm = volume: 62.1 mL. No fibroids or other mass visualized. Endometrium Thickness: 3.7 mm.  No focal abnormality visualized. Right ovary Measurements: 4.2 x 2.2 x 2.3 cm = volume: 10.9 mL. Normal appearance/no adnexal mass. Left ovary Measurements: 2.6 x 1.3 x 1.9 cm = volume: 3.2 mL. Normal appearance/no adnexal mass. Other findings Small volume free fluid within the pelvis, presumably physiologic. IMPRESSION: Normal pelvic ultrasound.  No acute abnormality identified. Electronically Signed   By: Rise Mu M.D.   On: 02/03/2019 03:55   US Pelvis Complete  Result Date: 02/03/2019 CLINICAL DATA:  Initial evaluation for acute lower abdominal pain. EXAM: TRANSABDOMINAL AND TRANSVAGINAL ULTRASOUND OF PELVIS TECHNIQUE: Both transabdominal and transvaginal ultrasound examinations of the pelvis were performed. Transabdominal technique was performed for global imaging of the pelvis including uterus, ovaries, adnexal regions, and pelvic cul-de-sac. It was necessary to proceed with  endovaginal exam following the transabdominal exam to visualize the uterus, endometrium, and ovaries. COMPARISON:  Prior CT from 02/16/2016 FINDINGS: Uterus Measurements: 6.2 x 3.9 x 4.9 cm = volume: 62.1 mL. No fibroids or other mass visualized. Endometrium Thickness: 3.7 mm.  No focal abnormality visualized. Right ovary Measurements: 4.2 x 2.2 x 2.3 cm = volume: 10.9 mL. Normal appearance/no adnexal mass. Left ovary Measurements: 2.6 x 1.3 x 1.9 cm = volume: 3.2 mL. Normal appearance/no adnexal mass. Other findings Small volume free fluid within the pelvis, presumably physiologic. IMPRESSION: Normal pelvic ultrasound.  No acute abnormality identified. Electronically Signed   By: Rise Mu M.D.   On: 02/03/2019 03:55     ____________________________________________  Procedures   ____________________________________________   INITIAL IMPRESSION / MDM / ASSESSMENT AND PLAN / ED COURSE  As part of my medical decision making, I reviewed the following data within the electronic MEDICAL RECORD NUMBER   23 year old female presented with above-stated history and physical exam secondary to pelvic discomfort currently menstruating.  Pelvic ultrasound revealed no gross abnormality.  Patient requested a pelvic exam to be performed stated that her significant other was currently being seen at a hospital in [place] for sexually transmitted illness.  Pelvic exam performed however patient currently menstruating.  Patient requested empiric treatment before results were obtained.  Patient given ceftriaxone 2 and 50 mg IM and azithromycin 1 g p.o. ____________________________________________  FINAL CLINICAL IMPRESSION(S) / ED DIAGNOSES  Final diagnoses:  Pelvic pain  STI (sexually transmitted infection)     MEDICATIONS GIVEN DURING THIS VISIT:  Medications - No data to display   ED Discharge Orders    None       Note:  This document was prepared using Dragon voice recognition software and may include unintentional dictation errors.   Darci Current, MD 02/03/19 [phone]

## 2019-02-27 ENCOUNTER — Emergency Department
Admission: EM | Admit: 2019-02-27 | Discharge: 2019-02-27 | Disposition: A | Discharge status: Home / Self Care | Payer: Self-pay | Attending: Emergency Medicine | Admitting: Emergency Medicine

## 2019-02-27 ENCOUNTER — Emergency Department: Payer: Self-pay

## 2019-02-27 ENCOUNTER — Other Ambulatory Visit: Payer: Self-pay

## 2019-02-27 ENCOUNTER — Encounter: Payer: Self-pay | Admitting: Intensive Care

## 2019-02-27 DIAGNOSIS — Z79899 Other long term (current) drug therapy: Secondary | ICD-10-CM | POA: Insufficient documentation

## 2019-02-27 DIAGNOSIS — J45909 Unspecified asthma, uncomplicated: Secondary | ICD-10-CM | POA: Insufficient documentation

## 2019-02-27 DIAGNOSIS — J039 Acute tonsillitis, unspecified: Secondary | ICD-10-CM

## 2019-02-27 DIAGNOSIS — J020 Streptococcal pharyngitis: Secondary | ICD-10-CM | POA: Insufficient documentation

## 2019-02-27 DIAGNOSIS — R21 Rash and other nonspecific skin eruption: Secondary | ICD-10-CM | POA: Insufficient documentation

## 2019-02-27 LAB — COMPREHENSIVE METABOLIC PANEL
ALT: 13 U/L (ref 0–44)
AST: 13 U/L — ABNORMAL LOW (ref 15–41)
Albumin: 4.3 g/dL (ref 3.5–5.0)
Alkaline Phosphatase: 66 U/L (ref 38–126)
Anion gap: 9 (ref 5–15)
BUN: 10 mg/dL (ref 6–20)
CO2: 26 mmol/L (ref 22–32)
Calcium: 9.1 mg/dL (ref 8.9–10.3)
Chloride: 103 mmol/L (ref 98–111)
Creatinine, Ser: 0.85 mg/dL (ref 0.44–1.00)
GFR calc Af Amer: >60 mL/min (ref >60)
GFR calc non Af Amer: >60 mL/min (ref >60)
Glucose, Bld: 109 mg/dL — ABNORMAL HIGH (ref 70–99)
Potassium: 4.2 mmol/L (ref 3.5–5.1)
Sodium: 138 mmol/L (ref 135–145)
Total Bilirubin: 0.9 mg/dL (ref 0.3–1.2)
Total Protein: 8.2 g/dL — ABNORMAL HIGH (ref 6.5–8.1)

## 2019-02-27 LAB — CBC WITH DIFFERENTIAL/PLATELET
Abs Immature Granulocytes: 0.16 10*3/uL — ABNORMAL HIGH (ref 0.00–0.07)
Basophils Absolute: 0.1 10*3/uL (ref 0.0–0.1)
Basophils Relative: 0 %
Eosinophils Absolute: 0.7 10*3/uL — ABNORMAL HIGH (ref 0.0–0.5)
Eosinophils Relative: 3 %
HCT: 45.2 % (ref 36.0–46.0)
Hemoglobin: 15.2 g/dL — ABNORMAL HIGH (ref 12.0–15.0)
Immature Granulocytes: 1 %
Lymphocytes Relative: 8 %
Lymphs Abs: 2.1 10*3/uL (ref 0.7–4.0)
MCH: 29.6 pg (ref 26.0–34.0)
MCHC: 33.6 g/dL (ref 30.0–36.0)
MCV: 88.1 fL (ref 80.0–100.0)
Monocytes Absolute: 1.5 10*3/uL — ABNORMAL HIGH (ref 0.1–1.0)
Monocytes Relative: 6 %
Neutro Abs: 21.4 10*3/uL — ABNORMAL HIGH (ref 1.7–7.7)
Neutrophils Relative %: 82 %
Platelets: 398 10*3/uL (ref 150–400)
RBC: 5.13 MIL/uL — ABNORMAL HIGH (ref 3.87–5.11)
RDW: 12.6 % (ref 11.5–15.5)
WBC: 25.9 10*3/uL — ABNORMAL HIGH (ref 4.0–10.5)
nRBC: 0 % (ref 0.0–0.2)

## 2019-02-27 LAB — POCT PREGNANCY, URINE: PREG TEST UR: NEGATIVE

## 2019-02-27 LAB — GROUP A STREP BY PCR: Group A Strep by PCR: NOT DETECTED

## 2019-02-27 LAB — LACTIC ACID, PLASMA: Lactic Acid, Venous: 1 mmol/L (ref 0.5–1.9)

## 2019-02-27 MED ORDER — DIPHENHYDRAMINE HCL 50 MG/ML IJ SOLN
50.0000 mg | Freq: Once | INTRAMUSCULAR | Status: AC
  Administered 2019-02-27: 50 mg via INTRAVENOUS

## 2019-02-27 MED ORDER — DEXAMETHASONE SODIUM PHOSPHATE 10 MG/ML IJ SOLN
10.0000 mg | Freq: Once | INTRAMUSCULAR | Status: AC
  Administered 2019-02-27: 10 mg via INTRAVENOUS
  Filled 2019-02-27: qty 1

## 2019-02-27 MED ORDER — PREDNISONE 10 MG (21) PO TBPK: ORAL_TABLET | ORAL | 0 refills | Status: DC

## 2019-02-27 MED ORDER — CLINDAMYCIN PHOSPHATE 600 MG/50ML IV SOLN
600.0000 mg | Freq: Once | INTRAVENOUS | Status: AC
  Administered 2019-02-27: 600 mg via INTRAVENOUS
  Filled 2019-02-27: qty 50

## 2019-02-27 MED ORDER — ALBUTEROL SULFATE HFA 108 (90 BASE) MCG/ACT IN AERS: 2.0000 | INHALATION_SPRAY | Freq: Four times a day (QID) | RESPIRATORY_TRACT | 2 refills | Status: DC | PRN

## 2019-02-27 MED ORDER — IOHEXOL 300 MG/ML  SOLN
75.0000 mL | Freq: Once | INTRAMUSCULAR | Status: AC | PRN
  Administered 2019-02-27: 75 mL via INTRAVENOUS
  Filled 2019-02-27: qty 75

## 2019-02-27 MED ORDER — DIPHENHYDRAMINE HCL 50 MG/ML IJ SOLN
INTRAMUSCULAR | Status: AC
  Administered 2019-02-27: 50 mg via INTRAVENOUS
  Filled 2019-02-27: qty 1

## 2019-02-27 MED ORDER — SODIUM CHLORIDE 0.9 % IV BOLUS
1000.0000 mL | Freq: Once | INTRAVENOUS | Status: AC
  Administered 2019-02-27: 1000 mL via INTRAVENOUS

## 2019-02-27 MED ORDER — ACETAMINOPHEN 160 MG/5ML PO SOLN
650.0000 mg | Freq: Once | ORAL | Status: AC
  Administered 2019-02-27: 650 mg via ORAL
  Filled 2019-02-27 (×2): qty 20.3

## 2019-02-27 MED ORDER — CLINDAMYCIN HCL 300 MG PO CAPS: 300.0000 mg | ORAL_CAPSULE | Freq: Three times a day (TID) | ORAL | 0 refills | Status: AC

## 2019-02-27 NOTE — ED Triage Notes (Signed)
Patient c/o sore throat since last night.

## 2019-02-27 NOTE — ED Notes (Signed)
See triage note   Presents with sore throat  States pain is amnily to right side of throat   Low grade fever

## 2019-02-27 NOTE — ED Provider Notes (Signed)
[facility] Emergency Department Provider Note  ____________________________________________  Time seen: Approximately 9:37 PM  I have reviewed the triage vital signs and the nursing notes.   HISTORY  Chief Complaint Sore Throat    HPI Kathryn Nash is a 23 y.o. female presents to the ED with pharyngitis, fever and chills for the past two days. Patient has exquisite pain along the right side of her neck.  She has noticed some muffled voice and is having difficulty swallowing.  Patient is speaking in complete sentences in exam room.  She has a metered-dose inhaler on her lap but denies shortness of breath.  Patient denies history of peritonsillar or retropharyngeal abscess in the past.  No alleviating measures have been attempted.        Past Medical History:  Diagnosis Date  . Asthma     Patient Active Problem List   Diagnosis Date Noted  . Asthma exacerbation 12/08/2018    History reviewed. No pertinent surgical history.  Prior to Admission medications   Medication Sig Start Date End Date Taking? Authorizing Provider  albuterol (PROVENTIL HFA;VENTOLIN HFA) 108 (90 Base) MCG/ACT inhaler Inhale 2 puffs into the lungs every 4 (four) hours as needed for wheezing or shortness of breath. 11/12/18   Claudean Severance, MD  albuterol (PROVENTIL) (2.5 MG/3ML) 0.083% nebulizer solution Take 3 mLs (2.5 mg total) by nebulization every 6 (six) hours as needed for wheezing or shortness of breath. 11/12/18   Claudean Severance, MD  clindamycin (CLEOCIN) 300 MG capsule Take 1 capsule (300 mg total) by mouth 3 (three) times daily for 10 days. 02/27/19 03/09/19  Orvil Feil, PA-C  Fluticasone-Salmeterol (ADVAIR DISKUS) 250-50 MCG/DOSE AEPB Inhale 1 puff into the lungs 2 (two) times daily. 12/10/18 12/10/19  Adrian Saran, MD  loratadine (CLARITIN) 10 MG tablet Take 1 tablet (10 mg total) by mouth daily. 12/11/18   Adrian Saran, MD  predniSONE (STERAPRED UNI-PAK 21 TAB) 10 MG  (21) TBPK tablet Take 6 tablets the first day, take 5 tablets the second day, take 4 tablets the third day, take 3 tablets the fourth day, take 2 tablets the fifth day, take 1 tablet the sixth day. 02/27/19   Orvil Feil, PA-C    Allergies Iohexol; Eggs or egg-derived products; Montelukast sodium; and Penicillins  History reviewed. No pertinent family history.  Social History Social History   Tobacco Use  . Smoking status: Never Smoker  . Smokeless tobacco: Never Used  Substance Use Topics  . Alcohol use: No  . Drug use: No     Review of Systems  Constitutional: Patient has fever.  Eyes: No visual changes. No discharge ENT: Patient has pharyngitis.  Cardiovascular: no chest pain. Respiratory: no cough. No SOB. Gastrointestinal: No abdominal pain.  No nausea, no vomiting.  No diarrhea.  No constipation. Genitourinary: Negative for dysuria. No hematuria Musculoskeletal: Negative for musculoskeletal pain. Skin: Negative for rash, abrasions, lacerations, ecchymosis. Neurological: Negative for headaches, focal weakness or numbness.  ____________________________________________   PHYSICAL EXAM:  VITAL SIGNS: ED Triage Vitals [02/27/19 1853]  Enc Vitals Group     BP (!) 108/59     Pulse Rate (!) 108     Resp 16     Temp 99.9 F (37.7 C)     Temp Source Oral     SpO2 98 %     Weight 230 lb (104.3 kg)     Height 5\' 6"  (1.676 m)     Head Circumference  Peak Flow      Pain Score 10     Pain Loc      Pain Edu?      Excl. in GC?      Constitutional: Alert and oriented. Well appearing and in no acute distress. Eyes: Conjunctivae are normal. PERRL. EOMI. Head: Atraumatic. ENT:      Ears: TMs are pearly.       Nose: No congestion/rhinnorhea.      Mouth/Throat: Mucous membranes are moist. Class III Mallampati score.  Tonsils are difficult to visualize. Neck: No stridor.  No cervical spine tenderness to palpation. Hematological/Lymphatic/Immunilogical: Patient  has palpable right sided anterior cervical lymphadenopathy. Cardiovascular: Normal rate, regular rhythm. Normal S1 and S2.  Good peripheral circulation. Respiratory: Normal respiratory effort without tachypnea or retractions. Lungs CTAB. Good air entry to the bases with no decreased or absent breath sounds. Gastrointestinal: Bowel sounds 4 quadrants. Soft and nontender to palpation. No guarding or rigidity. No palpable masses. No distention. No CVA tenderness. Musculoskeletal: Full range of motion to all extremities. No gross deformities appreciated. Neurologic:  Normal speech and language. No gross focal neurologic deficits are appreciated.  Skin:  Skin is warm, dry and intact. No rash noted. Psychiatric: Mood and affect are normal. Speech and behavior are normal. Patient exhibits appropriate insight and judgement.   ____________________________________________   LABS (all labs ordered are listed, but only abnormal results are displayed)  Labs Reviewed  CBC WITH DIFFERENTIAL/PLATELET - Abnormal; Notable for the following components:      Result Value   WBC 25.9 (*)    RBC 5.13 (*)    Hemoglobin 15.2 (*)    Neutro Abs 21.4 (*)    Monocytes Absolute 1.5 (*)    Eosinophils Absolute 0.7 (*)    Abs Immature Granulocytes 0.16 (*)    All other components within normal limits  COMPREHENSIVE METABOLIC PANEL - Abnormal; Notable for the following components:   Glucose, Bld 109 (*)    Total Protein 8.2 (*)    AST 13 (*)    All other components within normal limits  GROUP A STREP BY PCR  LACTIC ACID, PLASMA  POC URINE PREG, ED  POCT PREGNANCY, URINE   ____________________________________________  EKG   ____________________________________________  RADIOLOGY I personally viewed and evaluated these images as part of my medical decision making, as well as reviewing the written report by the radiologist.    Ct Soft Tissue Neck W Contrast  Result Date: 02/27/2019 CLINICAL DATA:   Initial evaluation for acute sore throat, stridor. EXAM: CT NECK WITH CONTRAST TECHNIQUE: Multidetector CT imaging of the neck was performed using the standard protocol following the bolus administration of intravenous contrast. CONTRAST:  15mL OMNIPAQUE IOHEXOL 300 MG/ML  SOLN COMPARISON:  None. FINDINGS: Pharynx and larynx: Oral cavity within normal limits. No acute inflammatory changes seen about the dentition. Palatine tonsils are enlarged and hyperenhancing bilaterally, right slightly greater than left, suggesting acute tonsillitis. Superimposed 1 cm hypodensity at the medial aspect of the right tonsil likely reflects phlegmon and/or developing abscess. No frank drainable collections seen at this time. Mild induration noted within the right parapharyngeal fat. Nasopharynx within normal limits. No retropharyngeal collection. Epiglottis normal without evidence for acute epiglottitis. Vallecula clear. Remainder of the hypopharynx and supraglottic larynx within normal limits. Glottis within normal limits. Subglottic airway clear. Salivary glands: Salivary glands including the parotid and submandibular glands are normal. Thyroid: Thyroid normal. Lymph nodes: Enlarged bilateral level II lymph nodes measure up to 17 mm on  the left and 14 mm on the right. Findings likely reactive. Additional scattered subcentimeter shotty cervical lymph nodes noted within the neck bilaterally with no other pathologically enlarged nodes identified. Vascular: Normal intravascular enhancement seen throughout the neck. Limited intracranial: Unremarkable. Visualized orbits: Not visualized on this exam. Mastoids and visualized paranasal sinuses: Mild scattered mucosal thickening within the ethmoidal air cells and maxillary sinuses. Visualized paranasal sinuses are otherwise clear. Visualize mastoids and middle ear cavities are well pneumatized and free of fluid. Skeleton: No acute osseous finding. No discrete lytic or blastic osseous  lesions. Upper chest: Visualized upper chest demonstrates no acute finding. Normal residual thymic tissue noted within the anterior mediastinum. Visualized lungs are clear. Other: None. IMPRESSION: 1. Diffuse enlargement of the palatine tonsils bilaterally, right slightly worse than left, suggesting acute tonsillitis. Ill-defined 1 cm hypodensity within the right tonsil suggestive of phlegmon and/or possible developing abscess. No frank drainable fluid collection seen at this time. 2. Enlarged bilateral level II adenopathy as above, likely reactive. 3. No evidence for acute epiglottitis. Electronically Signed   By: Rise MuBenjamin  McClintock M.D.   On: 02/27/2019 22:27    ____________________________________________    PROCEDURES  Procedure(s) performed:    Procedures    Medications  clindamycin (CLEOCIN) IVPB 600 mg (600 mg Intravenous New Bag/Given 02/27/19 2247)  dexamethasone (DECADRON) injection 10 mg (10 mg Intravenous Given 02/27/19 2106)  acetaminophen (TYLENOL) solution 650 mg (650 mg Oral Given 02/27/19 2144)  sodium chloride 0.9 % bolus 1,000 mL (1,000 mLs Intravenous New Bag/Given 02/27/19 2146)  iohexol (OMNIPAQUE) 300 MG/ML solution 75 mL (75 mLs Intravenous Contrast Given 02/27/19 2157)  diphenhydrAMINE (BENADRYL) injection 50 mg (50 mg Intravenous Given 02/27/19 2239)     ____________________________________________   INITIAL IMPRESSION / ASSESSMENT AND PLAN / ED COURSE  Pertinent labs & imaging results that were available during my care of the patient were reviewed by me and considered in my medical decision making (see chart for details).  Review of the [place] CSRS was performed in accordance of the [facility] prior to dispensing any controlled drugs.       ----------------------------------------- 10:43 PM on 02/27/2019 -----------------------------------------  Patient reassessed and informed of CT soft tissue neck results.  Patient states that she developed pruritic rash  after having IV contrast.  Hives are visualized along patient's upper extremities and back.  Patient has only received Decadron in the emergency department.  Patient was also given 50 mg of Benadryl and started on clindamycin given penicillin allergy.     Assessment and Plan:  Tonsillitis Group A strep pharyngitis Patient presents to the emergency department with fever, pharyngitis and exquisite right-sided neck tenderness.  There was initial concern for peritonsillar abscess on the right.  Patient had a Mallampati score of 3 on physical exam and the tonsils were hard to visualize.  Patient had significant leukocytosis, white blood cell count was 26.  CT soft tissue neck was concerning only for tonsillitis there was a 1 cm hypodensity consistent with phlegmon versus early abscess.  Patient was started on clindamycin given penicillin allergy in the emergency department and was given Decadron.  Patient did have urticaria developed after IV contrast and 50 mg of Benadryl was given.  Patient was discharged with clindamycin and a tapered prednisone pack.  Patient was given strict return precautions to return to the emergency department for new or worsening symptoms.  Specifically, patient was advised to look out for worsening, muffled voice, shortness of breath, inability to swallow and inability to speak.  Patient voiced understanding.  All patient questions were answered.   ____________________________________________  FINAL CLINICAL IMPRESSION(S) / ED DIAGNOSES  Final diagnoses:  Strep pharyngitis  Tonsillitis      NEW MEDICATIONS STARTED DURING THIS VISIT:  ED Discharge Orders         Ordered    clindamycin (CLEOCIN) 300 MG capsule  3 times daily     02/27/19 2250    predniSONE (STERAPRED UNI-PAK 21 TAB) 10 MG (21) TBPK tablet     02/27/19 2250              This chart was dictated using voice recognition software/Dragon. Despite best efforts to proofread, errors can occur which  can change the meaning. Any change was purely unintentional.    Orvil Feil, PA-C 02/27/19 2309    Minna Antis, MD 02/28/19 [phone]

## 2019-03-04 ENCOUNTER — Other Ambulatory Visit: Payer: Self-pay | Admitting: Internal Medicine

## 2019-04-04 ENCOUNTER — Emergency Department: Payer: Medicaid Other

## 2019-04-04 ENCOUNTER — Encounter: Payer: Self-pay | Admitting: Emergency Medicine

## 2019-04-04 ENCOUNTER — Emergency Department
Admission: EM | Admit: 2019-04-04 | Discharge: 2019-04-04 | Disposition: A | Discharge status: Home / Self Care | Payer: Medicaid Other | Attending: Emergency Medicine | Admitting: Emergency Medicine

## 2019-04-04 ENCOUNTER — Other Ambulatory Visit: Payer: Self-pay

## 2019-04-04 DIAGNOSIS — O26899 Other specified pregnancy related conditions, unspecified trimester: Secondary | ICD-10-CM

## 2019-04-04 DIAGNOSIS — Z3A00 Weeks of gestation of pregnancy not specified: Secondary | ICD-10-CM | POA: Insufficient documentation

## 2019-04-04 DIAGNOSIS — R1084 Generalized abdominal pain: Secondary | ICD-10-CM | POA: Diagnosis present

## 2019-04-04 DIAGNOSIS — O200 Threatened abortion: Secondary | ICD-10-CM

## 2019-04-04 DIAGNOSIS — R109 Unspecified abdominal pain: Secondary | ICD-10-CM

## 2019-04-04 DIAGNOSIS — J45909 Unspecified asthma, uncomplicated: Secondary | ICD-10-CM | POA: Diagnosis not present

## 2019-04-04 LAB — COMPREHENSIVE METABOLIC PANEL
ALT: 17 U/L (ref 0–44)
AST: 16 U/L (ref 15–41)
Albumin: 3.9 g/dL (ref 3.5–5.0)
Alkaline Phosphatase: 60 U/L (ref 38–126)
Anion gap: 8 (ref 5–15)
BUN: 11 mg/dL (ref 6–20)
CO2: 24 mmol/L (ref 22–32)
Calcium: 8.6 mg/dL — ABNORMAL LOW (ref 8.9–10.3)
Chloride: 107 mmol/L (ref 98–111)
Creatinine, Ser: 0.63 mg/dL (ref 0.44–1.00)
GFR calc Af Amer: >60 mL/min (ref >60)
GFR calc non Af Amer: >60 mL/min (ref >60)
Glucose, Bld: 103 mg/dL — ABNORMAL HIGH (ref 70–99)
Potassium: 3.4 mmol/L — ABNORMAL LOW (ref 3.5–5.1)
Sodium: 139 mmol/L (ref 135–145)
Total Bilirubin: 0.9 mg/dL (ref 0.3–1.2)
Total Protein: 7.1 g/dL (ref 6.5–8.1)

## 2019-04-04 LAB — CHLAMYDIA/NGC RT PCR (ARMC ONLY)
Chlamydia Tr: NOT DETECTED
N gonorrhoeae: NOT DETECTED

## 2019-04-04 LAB — ABO/RH: ABO/RH(D): O POS

## 2019-04-04 LAB — URINALYSIS, COMPLETE (UACMP) WITH MICROSCOPIC
Bacteria, UA: NONE SEEN
Bilirubin Urine: NEGATIVE
Glucose, UA: NEGATIVE mg/dL
Hgb urine dipstick: NEGATIVE
Ketones, ur: NEGATIVE mg/dL
Leukocytes,Ua: NEGATIVE
Nitrite: NEGATIVE
Protein, ur: NEGATIVE mg/dL
Specific Gravity, Urine: 1.027 (ref 1.005–1.030)
pH: 6 (ref 5.0–8.0)

## 2019-04-04 LAB — WET PREP, GENITAL
Clue Cells Wet Prep HPF POC: NONE SEEN
Sperm: NONE SEEN
Trich, Wet Prep: NONE SEEN
Yeast Wet Prep HPF POC: NONE SEEN

## 2019-04-04 LAB — POCT PREGNANCY, URINE: Preg Test, Ur: POSITIVE — AB

## 2019-04-04 LAB — CBC
HCT: 38.1 % (ref 36.0–46.0)
Hemoglobin: 12.7 g/dL (ref 12.0–15.0)
MCH: 29.8 pg (ref 26.0–34.0)
MCHC: 33.3 g/dL (ref 30.0–36.0)
MCV: 89.4 fL (ref 80.0–100.0)
Platelets: 396 10*3/uL (ref 150–400)
RBC: 4.26 MIL/uL (ref 3.87–5.11)
RDW: 12.9 % (ref 11.5–15.5)
WBC: 15.1 10*3/uL — ABNORMAL HIGH (ref 4.0–10.5)
nRBC: 0 % (ref 0.0–0.2)

## 2019-04-04 LAB — HCG, QUANTITATIVE, PREGNANCY: hCG, Beta Chain, Quant, S: 118 m[IU]/mL — ABNORMAL HIGH (ref <5)

## 2019-04-04 LAB — LIPASE, BLOOD: Lipase: 26 U/L (ref 11–51)

## 2019-04-04 MED ORDER — SODIUM CHLORIDE 0.9% FLUSH: 3.0000 mL | Freq: Once | INTRAVENOUS | Status: DC

## 2019-04-04 NOTE — ED Triage Notes (Signed)
Lower abdominal pain x 1 month.

## 2019-04-04 NOTE — ED Provider Notes (Addendum)
[facility] Emergency Department Provider Note  ____________________________________________   I have reviewed the triage vital signs and the nursing notes. Where available I have reviewed prior notes and, if possible and indicated, outside hospital notes.    HISTORY  Chief Complaint Abdominal Pain    HPI Kathryn Nash is a 23 y.o. female  Well-known to this facility, presents today because she is had off-and-on slight cramping discomfort and then had 2+ pregnancy tests at home.  No lateralizing signs no fever no chills no pain at this time no vaginal discharge no vaginal bleeding, no numbness no weakness no fever no diarrhea no emesis, feels otherwise completely well but is concerned about the cramping discomfort.  She is G1, P0 if she is pregnant at this time.  Her last menstrual period was March 30 and seemed to be slightly different than normal over she cannot exactly explain how that was so.  Seem to be the normal flow in the normal time.      Past Medical History:  Diagnosis Date  . Asthma     Patient Active Problem List   Diagnosis Date Noted  . Asthma exacerbation 12/08/2018    History reviewed. No pertinent surgical history.  Prior to Admission medications   Medication Sig Start Date End Date Taking? Authorizing Provider  albuterol (PROVENTIL HFA;VENTOLIN HFA) 108 (90 Base) MCG/ACT inhaler Inhale 2 puffs into the lungs every 6 (six) hours as needed for wheezing or shortness of breath. 02/27/19   Orvil FeilWoods, Jaclyn M, PA-C  Fluticasone-Salmeterol (ADVAIR DISKUS) 250-50 MCG/DOSE AEPB Inhale 1 puff into the lungs 2 (two) times daily. 12/10/18 12/10/19  Adrian SaranMody, Sital, MD  loratadine (CLARITIN) 10 MG tablet Take 1 tablet (10 mg total) by mouth daily. 12/11/18   Adrian SaranMody, Sital, MD  predniSONE (STERAPRED UNI-PAK 21 TAB) 10 MG (21) TBPK tablet Take 6 tablets the first day, take 5 tablets the second day, take 4 tablets the third day, take 3 tablets the fourth day, take  2 tablets the fifth day, take 1 tablet the sixth day. 02/27/19   Orvil FeilWoods, Jaclyn M, PA-C    Allergies Iohexol; Eggs or egg-derived products; Montelukast sodium; and Penicillins  No family history on file.  Social History Social History   Tobacco Use  . Smoking status: Never Smoker  . Smokeless tobacco: Never Used  Substance Use Topics  . Alcohol use: No  . Drug use: No    Review of Systems Constitutional: No fever/chills Eyes: No visual changes. ENT: No sore throat. No stiff neck no neck pain Cardiovascular: Denies chest pain. Respiratory: Denies shortness of breath. Gastrointestinal:   no vomiting.  No diarrhea.  No constipation. Genitourinary: Negative for dysuria. Musculoskeletal: Negative lower extremity swelling Skin: Negative for rash. Neurological: Negative for severe headaches, focal weakness or numbness.   ____________________________________________   PHYSICAL EXAM:  VITAL SIGNS: ED Triage Vitals  Enc Vitals Group     BP 04/04/19 1356 (!) 160/89     Pulse Rate 04/04/19 1354 77     Resp 04/04/19 1354 18     Temp 04/04/19 1354 98 F (36.7 C)     Temp Source 04/04/19 1354 Oral     SpO2 04/04/19 1354 99 %     Weight 04/04/19 1355 200 lb (90.7 kg)     Height 04/04/19 1355 5\' 6"  (1.676 m)     Head Circumference --      Peak Flow --      Pain Score 04/04/19 1355 10  Pain Loc --      Pain Edu? --      Excl. in GC? --     Constitutional: Alert and oriented. Well appearing and in no acute distress. Eyes: Conjunctivae are normal Head: Atraumatic HEENT: No congestion/rhinnorhea. Mucous membranes are moist.  Oropharynx non-erythematous Neck:   Nontender with no meningismus, no masses, no stridor Cardiovascular: Normal rate, regular rhythm. Grossly normal heart sounds.  Good peripheral circulation. Respiratory: Normal respiratory effort.  No retractions. Lungs CTAB. Abdominal: Soft and nontender. No distention. No guarding no rebound Back:  There is no  focal tenderness or step off.  there is no midline tenderness there are no lesions noted. there is no CVA tenderness Pelvic exam: Female nurse chaperone present, no external lesions noted, physiologic vaginal discharge noted with no purulent discharge, no cervical motion tenderness, no adnexal tenderness or mass, there is no significant uterine tenderness or mass. No vaginal bleeding Musculoskeletal: No lower extremity tenderness, no upper extremity tenderness. No joint effusions, no DVT signs strong distal pulses no edema Neurologic:  Normal speech and language. No gross focal neurologic deficits are appreciated.  Skin:  Skin is warm, dry and intact. No rash noted. Psychiatric: Mood and affect are a bit anxious, and she states she is very anxious about being pregnant. Speech and behavior are normal.  ____________________________________________   LABS (all labs ordered are listed, but only abnormal results are displayed)  Labs Reviewed  CBC - Abnormal; Notable for the following components:      Result Value   WBC 15.1 (*)    All other components within normal limits  CHLAMYDIA/NGC RT PCR ([facility] ONLY)  WET PREP, GENITAL  LIPASE, BLOOD  COMPREHENSIVE METABOLIC PANEL  URINALYSIS, COMPLETE (UACMP) WITH MICROSCOPIC  HCG, QUANTITATIVE, PREGNANCY  POC URINE PREG, ED  POCT PREGNANCY, URINE  ABO/RH    Pertinent labs  results that were available during my care of the patient were reviewed by me and considered in my medical decision making (see chart for details). ____________________________________________  EKG  I personally interpreted any EKGs ordered by me or triage  ____________________________________________  RADIOLOGY  Pertinent labs & imaging results that were available during my care of the patient were reviewed by me and considered in my medical decision making (see chart for details). If possible, patient and/or family made aware of any abnormal findings.  No  results found. ____________________________________________    PROCEDURES  Procedure(s) performed: None  Procedures  Critical Care performed: None  ____________________________________________   INITIAL IMPRESSION / ASSESSMENT AND PLAN / ED COURSE  Pertinent labs & imaging results that were available during my care of the patient were reviewed by me and considered in my medical decision making (see chart for details).  Patient here with cramping discomfort and on for a month positive pregnancy test at home.  Pelvic exam does not show any lateralizing signs or bleeding however we will send Rh and type, quant, obtain an ultrasound although this early we may not be able to rule out ectopic, her dates may not be exactly right.  We will reassess.  Blood pressures initially elevated but patient is quite anxious we will watch it here.  Certainly at this stage of pregnancy not likely to be preeclampsia.  Undiagnosed hypertension and/or reaction to her own stress is the more likely etiology for this we will watch  ----------------------------------------- 3:18 PM on 04/04/2019 -----------------------------------------  Signed out to dr. Scotty Court at the end of my shift.    ____________________________________________  FINAL CLINICAL IMPRESSION(S) / ED DIAGNOSES  Final diagnoses:  None      This chart was dictated using voice recognition software.  Despite best efforts to proofread,  errors can occur which can change meaning.      Jeanmarie Plant, MD 04/04/19 1433    Jeanmarie Plant, MD 04/04/19 1518    Jeanmarie Plant, MD 04/04/19 1520

## 2019-04-04 NOTE — Discharge Instructions (Signed)
Your ultrasound today was unable to localize the pregnancy.  This may be because it is too early in pregnancy to see on ultrasound, or possibly due to something abnormal such as a miscarriage or implantation in an abnormal location.  Please follow-up with obstetrics in 2 days for reevaluation and repeat blood testing.  Beta-hCG is 118 today.  Blood type is O+.

## 2019-04-04 NOTE — ED Notes (Signed)
LMP march 30th. Positive preg test at home 2 days ago. States intermittent low abd pain x 1. Denies discharge or bleeding. Denies back pain.   A&O, ambulatory. No distress noted.

## 2019-04-04 NOTE — ED Provider Notes (Signed)
-----------------------------------------   4:50 PM on 04/04/2019 -----------------------------------------   Ultrasound unable to visualize pregnancy.  Unable to rule out ectopic.  Vital signs unremarkable.  Quant 118, Rh+.  Discussed with [facility] Dr. Jean Rosenthal who agrees with clinic follow-up in 2 days for reassessment, potential repeat beta-hCG.  Final diagnoses:  Threatened miscarriage in early pregnancy      Sharman Cheek, MD 04/04/19 1651

## 2019-04-09 ENCOUNTER — Other Ambulatory Visit: Payer: Self-pay | Admitting: Internal Medicine

## 2019-04-12 ENCOUNTER — Emergency Department
Admission: EM | Admit: 2019-04-12 | Discharge: 2019-04-12 | Disposition: A | Discharge status: Home / Self Care | Payer: Medicaid Other | Attending: Emergency Medicine | Admitting: Emergency Medicine

## 2019-04-12 ENCOUNTER — Encounter: Payer: Self-pay | Admitting: Emergency Medicine

## 2019-04-12 ENCOUNTER — Emergency Department: Payer: Medicaid Other

## 2019-04-12 ENCOUNTER — Other Ambulatory Visit: Payer: Self-pay

## 2019-04-12 DIAGNOSIS — O99511 Diseases of the respiratory system complicating pregnancy, first trimester: Secondary | ICD-10-CM | POA: Insufficient documentation

## 2019-04-12 DIAGNOSIS — O200 Threatened abortion: Secondary | ICD-10-CM | POA: Diagnosis not present

## 2019-04-12 DIAGNOSIS — J45909 Unspecified asthma, uncomplicated: Secondary | ICD-10-CM | POA: Insufficient documentation

## 2019-04-12 DIAGNOSIS — Z79899 Other long term (current) drug therapy: Secondary | ICD-10-CM | POA: Insufficient documentation

## 2019-04-12 DIAGNOSIS — Z3A01 Less than 8 weeks gestation of pregnancy: Secondary | ICD-10-CM | POA: Insufficient documentation

## 2019-04-12 LAB — HCG, QUANTITATIVE, PREGNANCY: hCG, Beta Chain, Quant, S: 1651 m[IU]/mL — ABNORMAL HIGH (ref <5)

## 2019-04-12 NOTE — Discharge Instructions (Addendum)
Follow-up at [facility] as soon as you are able to with your new Medicaid.  Return to the ER immediately for new, worsening, or persistent severe bleeding, pain, or any other new or worsening symptoms that concern you.  At this time, we cannot completely rule out an ectopic pregnancy (a pregnancy in a location outside the uterus) which can rupture and cause other complications.  Therefore, you need to be careful to return if your symptoms worsen.

## 2019-04-12 NOTE — ED Notes (Signed)
Patient transported to Ultrasound 

## 2019-04-12 NOTE — ED Triage Notes (Signed)
Pt with  vaginal bleeding, states she is [redacted] weeks pregnant, confirmed by blood test at [facility]. Also c/o non painful vaginal bleeding with some cramping noted prior to start of bleeding. Pt. States it is light pink in color.

## 2019-04-12 NOTE — ED Provider Notes (Signed)
[facility] Emergency Department Provider Note ____________________________________________   First MD Initiated Contact with Patient 04/12/19 1744     (approximate)  I have reviewed the triage vital signs and the nursing notes.   HISTORY  Chief Complaint Vaginal Bleeding    HPI Kathryn Nash is a 23 y.o. female with PMH as noted below approximately [redacted] weeks pregnant who presents with vaginal bleeding, a small amount described mainly spotting, acute onset shortly before coming to the ED.  The patient also reports some associated abdominal cramping which has been present for the last week.  She denies vomiting, fever, weakness, or other acute symptoms.  Past Medical History:  Diagnosis Date  . Asthma     Patient Active Problem List   Diagnosis Date Noted  . Asthma exacerbation 12/08/2018    History reviewed. No pertinent surgical history.  Prior to Admission medications   Medication Sig Start Date End Date Taking? Authorizing Provider  Fluticasone-Salmeterol (ADVAIR DISKUS) 250-50 MCG/DOSE AEPB Inhale 1 puff into the lungs 2 (two) times daily. 12/10/18 12/10/19  Adrian SaranMody, Sital, MD  loratadine (CLARITIN) 10 MG tablet Take 1 tablet (10 mg total) by mouth daily. 12/11/18   Adrian SaranMody, Sital, MD    Allergies Iohexol; Eggs or egg-derived products; Montelukast sodium; and Penicillins  History reviewed. No pertinent family history.  Social History Social History   Tobacco Use  . Smoking status: Never Smoker  . Smokeless tobacco: Never Used  Substance Use Topics  . Alcohol use: No  . Drug use: No    Review of Systems  Constitutional: No fever. Eyes: No redness. ENT: No sore throat. Cardiovascular: Denies chest pain. Respiratory: Denies shortness of breath. Gastrointestinal: No vomiting or diarrhea.  Genitourinary: Negative for dysuria.  Positive for vaginal bleeding. Musculoskeletal: Negative for back pain. Skin: Negative for rash. Neurological:  Negative for headache.   ____________________________________________   PHYSICAL EXAM:  VITAL SIGNS: ED Triage Vitals [04/12/19 1635]  Enc Vitals Group     BP      Pulse      Resp      Temp      Temp src      SpO2      Weight 206 lb (93.4 kg)     Height 5\' 6"  (1.676 m)     Head Circumference      Peak Flow      Pain Score 0     Pain Loc      Pain Edu?      Excl. in GC?     Constitutional: Alert and oriented. Well appearing and in no acute distress. Eyes: Conjunctivae are normal.  Head: Atraumatic. Nose: No congestion/rhinnorhea. Mouth/Throat: Mucous membranes are moist.   Neck: Normal range of motion.  Cardiovascular:  Good peripheral circulation. Respiratory: Normal respiratory effort.  Gastrointestinal: Soft and nontender. No distention.  Genitourinary: No flank tenderness. Musculoskeletal: Extremities warm and well perfused.  Neurologic:  Normal speech and language. No gross focal neurologic deficits are appreciated.  Skin:  Skin is warm and dry. No rash noted. Psychiatric: Mood and affect are normal. Speech and behavior are normal.  ____________________________________________   LABS (all labs ordered are listed, but only abnormal results are displayed)  Labs Reviewed  HCG, QUANTITATIVE, PREGNANCY - Abnormal; Notable for the following components:      Result Value   hCG, Beta Chain, Quant, S 1,651 (*)    All other components within normal limits   ____________________________________________  EKG   ____________________________________________  RADIOLOGY  US OB transvaginal: Gestational sac without fetal pole or yolk sac identified  ____________________________________________   PROCEDURES  Procedure(s) performed: No  Procedures  Critical Care performed: No ____________________________________________   INITIAL IMPRESSION / ASSESSMENT AND PLAN / ED COURSE  Pertinent labs & imaging results that were available during my care of the  patient were reviewed by me and considered in my medical decision making (see chart for details).  23 year old female approximately [redacted] weeks pregnant by dates presents with a small amount of vaginal bleeding today.  She has had cramping over the last week.  I reviewed the past medical records in epic; the patient was seen in the ED 8 days ago with abdominal cramping.  Ultrasound at that time showed no IUP and her hCG was 118.  The patient was referred to [facility], however she states that she was unable to follow-up because she does not have insurance and has only just applied for emergency Medicaid.  She believes that this will go through this week so she will be able to follow-up going forward.  On exam, the patient is well-appearing.  Her vital signs are normal.  Abdomen is soft and nontender and the remainder of the exam is unremarkable.  Presentation is consistent with threatened miscarriage.  At this time there is no clinical evidence for ectopic pregnancy although this is still a pregnancy of unknown location.  Repeat hCG is 1600.  We will obtain a repeat ultrasound and reassess.  ----------------------------------------- 8:38 PM on 04/12/2019 -----------------------------------------  Ultrasound shows gestational sac with no yolk sac or fetal pole.  Although this is not definitive for IUP, the patient is stable and has no symptoms of ectopic pregnancy.  She already had plans to follow-up this week at [facility] when her Medicaid kicks in, so there is no indication to reconsult OB/GYN today.  Patient is stable for discharge home.  I gave her a very thorough return precautions and instructed her to follow-up with [facility] this week or return here if she is not able to or has any new or worsening symptoms.  ____________________________________________   FINAL CLINICAL IMPRESSION(S) / ED DIAGNOSES  Final diagnoses:  Threatened miscarriage      NEW MEDICATIONS STARTED  DURING THIS VISIT:  Discharge Medication List as of 04/12/2019  8:29 PM       Note:  This document was prepared using Dragon voice recognition software and may include unintentional dictation errors.    Dionne Bucy, MD 04/12/19 2159

## 2019-04-12 NOTE — ED Notes (Signed)
Pt denies pain or cramping at this time. States some cramping earlier today and noticed "pink bleeding".

## 2019-04-14 ENCOUNTER — Emergency Department
Admission: EM | Admit: 2019-04-14 | Discharge: 2019-04-14 | Disposition: A | Discharge status: Home / Self Care | Payer: Medicaid Other | Attending: Emergency Medicine | Admitting: Emergency Medicine

## 2019-04-14 ENCOUNTER — Other Ambulatory Visit: Payer: Self-pay

## 2019-04-14 ENCOUNTER — Encounter: Payer: Self-pay | Admitting: Emergency Medicine

## 2019-04-14 DIAGNOSIS — Z3A01 Less than 8 weeks gestation of pregnancy: Secondary | ICD-10-CM | POA: Insufficient documentation

## 2019-04-14 DIAGNOSIS — O039 Complete or unspecified spontaneous abortion without complication: Secondary | ICD-10-CM | POA: Diagnosis not present

## 2019-04-14 DIAGNOSIS — O208 Other hemorrhage in early pregnancy: Secondary | ICD-10-CM | POA: Diagnosis present

## 2019-04-14 LAB — CBC WITH DIFFERENTIAL/PLATELET
Abs Immature Granulocytes: 0.06 10*3/uL (ref 0.00–0.07)
Basophils Absolute: 0.1 10*3/uL (ref 0.0–0.1)
Basophils Relative: 1 %
Eosinophils Absolute: 2.9 10*3/uL — ABNORMAL HIGH (ref 0.0–0.5)
Eosinophils Relative: 16 %
HCT: 40.7 % (ref 36.0–46.0)
Hemoglobin: 13.7 g/dL (ref 12.0–15.0)
Immature Granulocytes: 0 %
Lymphocytes Relative: 25 %
Lymphs Abs: 4.4 10*3/uL — ABNORMAL HIGH (ref 0.7–4.0)
MCH: 30 pg (ref 26.0–34.0)
MCHC: 33.7 g/dL (ref 30.0–36.0)
MCV: 89.3 fL (ref 80.0–100.0)
Monocytes Absolute: 1 10*3/uL (ref 0.1–1.0)
Monocytes Relative: 6 %
Neutro Abs: 9.2 10*3/uL — ABNORMAL HIGH (ref 1.7–7.7)
Neutrophils Relative %: 52 %
Platelets: 494 10*3/uL — ABNORMAL HIGH (ref 150–400)
RBC: 4.56 MIL/uL (ref 3.87–5.11)
RDW: 13.1 % (ref 11.5–15.5)
Smear Review: INCREASED
WBC: 17.7 10*3/uL — ABNORMAL HIGH (ref 4.0–10.5)
nRBC: 0 % (ref 0.0–0.2)

## 2019-04-14 LAB — COMPREHENSIVE METABOLIC PANEL
ALT: 16 U/L (ref 0–44)
AST: 20 U/L (ref 15–41)
Albumin: 4.5 g/dL (ref 3.5–5.0)
Alkaline Phosphatase: 72 U/L (ref 38–126)
Anion gap: 9 (ref 5–15)
BUN: 8 mg/dL (ref 6–20)
CO2: 25 mmol/L (ref 22–32)
Calcium: 9.6 mg/dL (ref 8.9–10.3)
Chloride: 105 mmol/L (ref 98–111)
Creatinine, Ser: 0.72 mg/dL (ref 0.44–1.00)
GFR calc Af Amer: >60 mL/min (ref >60)
GFR calc non Af Amer: >60 mL/min (ref >60)
Glucose, Bld: 102 mg/dL — ABNORMAL HIGH (ref 70–99)
Potassium: 3.8 mmol/L (ref 3.5–5.1)
Sodium: 139 mmol/L (ref 135–145)
Total Bilirubin: 0.4 mg/dL (ref 0.3–1.2)
Total Protein: 8.3 g/dL — ABNORMAL HIGH (ref 6.5–8.1)

## 2019-04-14 LAB — HCG, QUANTITATIVE, PREGNANCY: hCG, Beta Chain, Quant, S: 1006 m[IU]/mL — ABNORMAL HIGH (ref <5)

## 2019-04-14 LAB — ABO/RH: ABO/RH(D): O POS

## 2019-04-14 MED ORDER — FLUTICASONE-SALMETEROL 250-50 MCG/DOSE IN AEPB: 1.0000 | INHALATION_SPRAY | Freq: Two times a day (BID) | RESPIRATORY_TRACT | 0 refills | Status: DC

## 2019-04-14 MED ORDER — ALBUTEROL SULFATE HFA 108 (90 BASE) MCG/ACT IN AERS: 2.0000 | INHALATION_SPRAY | Freq: Four times a day (QID) | RESPIRATORY_TRACT | 2 refills | Status: DC | PRN

## 2019-04-14 NOTE — ED Triage Notes (Addendum)
Patient ambulatory to triage with steady gait, without difficulty or distress noted; pt reports persistent vag bleeding, seen here for same x 2 "but they didn't tell me anything"; st unable to get appt with OB because "my medicaid hasn't come thru"; st approx [redacted]wks pregnant, G1; denies any pain

## 2019-04-14 NOTE — ED Provider Notes (Signed)
[facility] Emergency Department Provider Note       Time seen: ----------------------------------------- 11:17 PM on 04/14/2019 -----------------------------------------   I have reviewed the triage vital signs and the nursing notes.  HISTORY   Chief Complaint Vaginal Bleeding   HPI Kathryn Nash is a 23 y.o. female with a history of asthma who presents to the ED for persistent mild vaginal bleeding but no abdominal pain.  Patient was told she was 4 weeks and 6 days pregnant 2 days ago and advised to come back for repeat hCG level.  She is G1, this was an unplanned pregnancy.  Past Medical History:  Diagnosis Date  . Asthma     Patient Active Problem List   Diagnosis Date Noted  . Asthma exacerbation 12/08/2018    History reviewed. No pertinent surgical history.  Allergies Iohexol; Eggs or egg-derived products; Montelukast sodium; and Penicillins  Social History Social History   Tobacco Use  . Smoking status: Never Smoker  . Smokeless tobacco: Never Used  Substance Use Topics  . Alcohol use: No  . Drug use: No    Review of Systems Constitutional: Negative for fever. Cardiovascular: Negative for chest pain. Respiratory: Negative for shortness of breath. Gastrointestinal: Negative for abdominal pain, vomiting and diarrhea. Genitourinary: Positive for vaginal bleeding Musculoskeletal: Negative for back pain. Skin: Negative for rash. Neurological: Negative for headaches, focal weakness or numbness.  All systems negative/normal/unremarkable except as stated in the HPI  ____________________________________________   PHYSICAL EXAM:  VITAL SIGNS: ED Triage Vitals  Enc Vitals Group     BP --      Pulse Rate 04/14/19 2105 88     Resp 04/14/19 2105 18     Temp 04/14/19 2105 97.7 F (36.5 C)     Temp Source 04/14/19 2105 Oral     SpO2 04/14/19 2105 99 %     Weight 04/14/19 2106 205 lb 14.6 oz (93.4 kg)     Height 04/14/19 2106 5'  6" (1.676 m)     Head Circumference --      Peak Flow --      Pain Score 04/14/19 2106 0     Pain Loc --      Pain Edu? --      Excl. in GC? --    Constitutional: Alert and oriented. Well appearing and in no distress. Eyes: Conjunctivae are normal. Normal extraocular movements. Cardiovascular: Normal rate, regular rhythm. No murmurs, rubs, or gallops. Respiratory: Normal respiratory effort without tachypnea nor retractions.  Mild wheezing bilaterally Gastrointestinal: Soft and nontender. Normal bowel sounds Musculoskeletal: Nontender with normal range of motion in extremities. No lower extremity tenderness nor edema. Neurologic:  Normal speech and language. No gross focal neurologic deficits are appreciated.  Skin:  Skin is warm, dry and intact. No rash noted. Psychiatric: Mood and affect are normal. Speech and behavior are normal.  ____________________________________________  ED COURSE:  As part of my medical decision making, I reviewed the following data within the electronic MEDICAL RECORD NUMBER History obtained from family if available, nursing notes, old chart and ekg, as well as notes from prior ED visits. Patient presented for vaginal bleeding in early pregnancy, we will assess with labs and consider imaging as indicated at this time.   Procedures  Kathryn Nash was evaluated in Emergency Department on 04/14/2019 for the symptoms described in the history of present illness. She was evaluated in the context of the global COVID-19 pandemic, which necessitated consideration that the patient might  be at risk for infection with the SARS-CoV-2 virus that causes COVID-19. Institutional protocols and algorithms that pertain to the evaluation of patients at risk for COVID-19 are in a state of rapid change based on information released by regulatory bodies including the CDC and federal and state organizations. These policies and algorithms were followed during the patient's care in the ED.   ____________________________________________   LABS (pertinent positives/negatives)  Labs Reviewed  HCG, QUANTITATIVE, PREGNANCY - Abnormal; Notable for the following components:      Result Value   hCG, Beta Chain, Quant, S 1,006 (*)    All other components within normal limits  CBC WITH DIFFERENTIAL/PLATELET - Abnormal; Notable for the following components:   WBC 17.7 (*)    Platelets 494 (*)    Neutro Abs 9.2 (*)    Lymphs Abs 4.4 (*)    Eosinophils Absolute 2.9 (*)    All other components within normal limits  COMPREHENSIVE METABOLIC PANEL - Abnormal; Notable for the following components:   Glucose, Bld 102 (*)    Total Protein 8.3 (*)    All other components within normal limits  ABO/RH  ____________________________________________   DIFFERENTIAL DIAGNOSIS   Threatened miscarriage, miscarriage, normal pregnancy, UTI  FINAL ASSESSMENT AND PLAN  Miscarriage   Plan: The patient had presented for repeat hCG. Patient's labs did reveal a decreasing quantitative hCG level from 1600 down to around 1000 consistent with miscarriage. Patient's imaging last time revealed an intrauterine pregnancy but no yolk sac.  She is cleared for follow-up as needed.   Ulice Dash, MD    Note: This note was generated in part or whole with voice recognition software. Voice recognition is usually quite accurate but there are transcription errors that can and very often do occur. I apologize for any typographical errors that were not detected and corrected.     Emily Filbert, MD 04/14/19 2320

## 2019-04-14 NOTE — ED Notes (Signed)
Patient state having some light bleeding and has passed a blood clot. Patient states she is around 5 weeks preg. Patient states not having any pain.

## 2019-04-15 ENCOUNTER — Emergency Department (HOSPITAL_COMMUNITY)
Admission: EM | Admit: 2019-04-15 | Discharge: 2019-04-15 | Disposition: A | Discharge status: Home / Self Care | Payer: Medicaid Other | Attending: Emergency Medicine | Admitting: Emergency Medicine

## 2019-04-15 ENCOUNTER — Other Ambulatory Visit: Payer: Self-pay

## 2019-04-15 DIAGNOSIS — N939 Abnormal uterine and vaginal bleeding, unspecified: Secondary | ICD-10-CM | POA: Insufficient documentation

## 2019-04-15 DIAGNOSIS — Z5321 Procedure and treatment not carried out due to patient leaving prior to being seen by health care provider: Secondary | ICD-10-CM | POA: Insufficient documentation

## 2019-04-15 LAB — PATHOLOGIST SMEAR REVIEW

## 2019-04-15 NOTE — ED Triage Notes (Signed)
Pt reports with mild vaginal bleeding after being told that she was miscarrying yesterday. Pt states "They told me I should start bleeding more with clots and what not but nothing has changed. I'm having no symptoms other than the mild bleeding its been doing for a few days now." Bleeding is not enough to saturate a pad or liner.

## 2019-04-15 NOTE — ED Notes (Signed)
Pt called for room, no response from lobby 

## 2019-05-16 ENCOUNTER — Emergency Department
Admission: EM | Admit: 2019-05-16 | Discharge: 2019-05-16 | Disposition: A | Discharge status: Home / Self Care | Payer: Medicaid Other | Attending: Emergency Medicine | Admitting: Emergency Medicine

## 2019-05-16 ENCOUNTER — Emergency Department: Payer: Medicaid Other

## 2019-05-16 ENCOUNTER — Other Ambulatory Visit: Payer: Self-pay

## 2019-05-16 ENCOUNTER — Encounter: Payer: Self-pay | Admitting: Emergency Medicine

## 2019-05-16 DIAGNOSIS — M25561 Pain in right knee: Secondary | ICD-10-CM

## 2019-05-16 DIAGNOSIS — Z79899 Other long term (current) drug therapy: Secondary | ICD-10-CM | POA: Diagnosis not present

## 2019-05-16 DIAGNOSIS — J45909 Unspecified asthma, uncomplicated: Secondary | ICD-10-CM | POA: Diagnosis not present
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
## 2019-05-16 NOTE — ED Notes (Signed)
X-ray at bedside

## 2019-05-16 NOTE — Discharge Instructions (Addendum)
Follow-up with [facility] clinic acute care if any continued problems.  Ice and elevation for the next 2 days.  Take ibuprofen which will help with inflammation and also pain.  Apply Ace wrap for added protection.
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
## 2019-07-10 ENCOUNTER — Other Ambulatory Visit: Payer: Self-pay

## 2019-07-10 ENCOUNTER — Encounter: Payer: Self-pay | Admitting: Emergency Medicine

## 2019-07-10 ENCOUNTER — Emergency Department
Admission: EM | Admit: 2019-07-10 | Discharge: 2019-07-11 | Disposition: A | Discharge status: Home / Self Care | Payer: Medicaid Other | Attending: Emergency Medicine | Admitting: Emergency Medicine

## 2019-07-10 DIAGNOSIS — Z79899 Other long term (current) drug therapy: Secondary | ICD-10-CM | POA: Insufficient documentation

## 2019-07-10 DIAGNOSIS — J45909 Unspecified asthma, uncomplicated: Secondary | ICD-10-CM | POA: Insufficient documentation

## 2019-07-10 DIAGNOSIS — G43909 Migraine, unspecified, not intractable, without status migrainosus: Secondary | ICD-10-CM

## 2019-07-10 DIAGNOSIS — R51 Headache: Secondary | ICD-10-CM | POA: Diagnosis present

## 2019-07-10 DIAGNOSIS — J029 Acute pharyngitis, unspecified: Secondary | ICD-10-CM | POA: Diagnosis not present

## 2019-07-10 MED ORDER — SODIUM CHLORIDE 0.9 % IV BOLUS
500.0000 mL | Freq: Once | INTRAVENOUS | Status: AC
  Administered 2019-07-11: 500 mL via INTRAVENOUS

## 2019-07-10 MED ORDER — KETOROLAC TROMETHAMINE 30 MG/ML IJ SOLN
15.0000 mg | Freq: Once | INTRAMUSCULAR | Status: AC
  Administered 2019-07-11: 15 mg via INTRAVENOUS
  Filled 2019-07-10: qty 1

## 2019-07-10 MED ORDER — METOCLOPRAMIDE HCL 5 MG/ML IJ SOLN
10.0000 mg | INTRAMUSCULAR | Status: AC
  Administered 2019-07-11: 10 mg via INTRAVENOUS
  Filled 2019-07-10: qty 2

## 2019-07-10 MED ORDER — DEXAMETHASONE SODIUM PHOSPHATE 10 MG/ML IJ SOLN
10.0000 mg | Freq: Once | INTRAMUSCULAR | Status: AC
  Administered 2019-07-11: 10 mg via INTRAVENOUS
  Filled 2019-07-10: qty 1

## 2019-07-10 MED ORDER — DIPHENHYDRAMINE HCL 50 MG/ML IJ SOLN
25.0000 mg | INTRAMUSCULAR | Status: AC
  Administered 2019-07-11: 25 mg via INTRAVENOUS
  Filled 2019-07-10: qty 1

## 2019-07-10 NOTE — ED Notes (Signed)
Pt laying on bed watching videos on her cell phone. Asking for the lights to be turned down as they are making her head hurt. Lights dimmed and blanket given.

## 2019-07-10 NOTE — ED Notes (Signed)
Patient refused strep swab at this time.

## 2019-07-10 NOTE — ED Notes (Signed)
Pt still refusing strep swab. Sates "my main focus is my headache."

## 2019-07-10 NOTE — ED Triage Notes (Signed)
Patient with complaint of left side migraine and sore throat time three days. Patient states that she takes tylenol and the pain eases off but that it comes back.

## 2019-07-11 NOTE — Discharge Instructions (Signed)

## 2019-07-11 NOTE — ED Provider Notes (Signed)
[facility] Emergency Department Provider Note  ____________________________________________   First MD Initiated Contact with Patient 07/10/19 2313     (approximate)  I have reviewed the triage vital signs and the nursing notes.   HISTORY  Chief Complaint Migraine and Sore Throat    HPI Kathryn Nash is a 23 y.o. female with medical history as listed below who reports that she has had headaches in the past but never specifically diagnosed with migraines.  She presents tonight for evaluation of about 3 days of persistent and recurrent headaches.  She says it feels like her whole head is throbbing.  Light and loud noises make the pain worse and nothing particular makes it better.  Sometimes she feels like her vision is a little bit blurry.  She did not have any recent trauma.  She has had a little bit of nausea but none currently.  She has had a sore throat recently but she has already been diagnosed with tonsillitis and says she is already on medicine for that she does not need to be further tested for her sore throat.  She has not been in contact with any COVID-19 patients and she was tested for COVID-19 last week and was negative.  She denies body aches, shortness of breath, cough, chest pain, abdominal pain, and dysuria.  Nothing in particular makes her symptoms better.  She describes the pain is severe.         Past Medical History:  Diagnosis Date  . Asthma     Patient Active Problem List   Diagnosis Date Noted  . Asthma exacerbation 12/08/2018    History reviewed. No pertinent surgical history.  Prior to Admission medications   Medication Sig Start Date End Date Taking? Authorizing Provider  albuterol (VENTOLIN HFA) 108 (90 Base) MCG/ACT inhaler Inhale 2 puffs into the lungs every 6 (six) hours as needed for wheezing or shortness of breath. 04/14/19   Earleen Newport, MD  Fluticasone-Salmeterol (ADVAIR DISKUS) 250-50 MCG/DOSE AEPB Inhale 1  puff into the lungs 2 (two) times daily. 04/14/19 04/13/20  Earleen Newport, MD  loratadine (CLARITIN) 10 MG tablet Take 1 tablet (10 mg total) by mouth daily. 12/11/18   Bettey Costa, MD    Allergies Iohexol, Eggs or egg-derived products, Montelukast sodium, and Penicillins  No family history on file.  Social History Social History   Tobacco Use  . Smoking status: Never Smoker  . Smokeless tobacco: Never Used  Substance Use Topics  . Alcohol use: No  . Drug use: No    Review of Systems Constitutional: No fever/chills Eyes: Occasional blurry vision associated with headache. ENT: Mild sore throat, already being treated for tonsillitis. Cardiovascular: Denies chest pain. Respiratory: Denies shortness of breath. Gastrointestinal: No abdominal pain.  No nausea, no vomiting.  No diarrhea.  No constipation. Genitourinary: Negative for dysuria. Musculoskeletal: Negative for neck pain.  Negative for back pain. Integumentary: Negative for rash. Neurological: Global throbbing headache, no focal numbness nor weakness.   ____________________________________________   PHYSICAL EXAM:  VITAL SIGNS: ED Triage Vitals  Enc Vitals Group     BP 07/10/19 2111 122/69     Pulse Rate 07/10/19 2111 82     Resp 07/10/19 2111 18     Temp 07/10/19 2111 98.9 F (37.2 C)     Temp Source 07/10/19 2111 Oral     SpO2 07/10/19 2111 97 %     Weight 07/10/19 2111 94 kg (207 lb 3.2 oz)  Height 07/10/19 2111 1.676 m (5\' 6" )     Head Circumference --      Peak Flow --      Pain Score 07/10/19 2114 6     Pain Loc --      Pain Edu? --      Excl. in GC? --     Constitutional: Alert and oriented.  Laughing and joking with me, does not appear to be in severe distress. Eyes: Conjunctivae are normal.  Pupils are equal and reactive. Head: Atraumatic. Nose: Oropharynx is nonerythematous and I do not see any sign of exudate or swelling. Mouth/Throat: Mucous membranes are moist. Neck: No stridor.  No  meningeal signs.   Cardiovascular: Normal rate, regular rhythm. Good peripheral circulation. Grossly normal heart sounds. Respiratory: Normal respiratory effort.  No retractions. Gastrointestinal: Soft and nontender. No distention.  Musculoskeletal: No lower extremity tenderness nor edema. No gross deformities of extremities. Neurologic:  Normal speech and language. No gross focal neurologic deficits are appreciated.  Skin:  Skin is warm, dry and intact. Psychiatric: Mood and affect are normal. Speech and behavior are normal.  ____________________________________________   LABS (all labs ordered are listed, but only abnormal results are displayed)  Labs Reviewed - No data to display ____________________________________________  EKG  None - EKG not ordered by ED physician ____________________________________________  RADIOLOGY I, Loleta Roseory Kayren Holck, personally viewed and evaluated these images (plain radiographs) as part of my medical decision making, as well as reviewing the written report by the radiologist.  ED MD interpretation:  No indication for emergent imaging  Official radiology report(s): No results found.  ____________________________________________   PROCEDURES   Procedure(s) performed (including Critical Care):  Procedures   ____________________________________________   INITIAL IMPRESSION / MDM / ASSESSMENT AND PLAN / ED COURSE  As part of my medical decision making, I reviewed the following data within the electronic MEDICAL RECORD NUMBER Nursing notes reviewed and incorporated, Old chart reviewed and Notes from prior ED visits   Differential diagnosis includes, but is not limited to, intracranial hemorrhage, meningitis/encephalitis, previous head trauma, cavernous venous thrombosis, tension headache, temporal arteritis, migraine or migraine equivalent, idiopathic intracranial hypertension, and non-specific headache.  Based on the photophobia and the persistent  or recurrent (it is unclear exactly which) headaches the patient has been experiencing the last several days, I believe she is most likely suffering from migraines.  I discussed work-up and treatment with her and we agreed to try a "migraine cocktail" consisting of the following "Toradol 15 mg IV, Decadron 10 mg IV, Reglan 10 mg IV, Benadryl 25 mg IV, and normal saline 500 mL IV.  I will reassess at that point.  She is feeling better and ready to go home I will discharge, if not we will consider further evaluation such as imaging but I think the possibility of a radiological finding is very low.  She is in no acute distress with stable vital signs.  Low suspicion for COVID-19.       Clinical Course as of Jul 10 206  Sat Jul 11, 2019  0206 The patient was sleeping.  I woke her up and she said that she feels completely better, stating that she has not felt this well for days.  No indication for further work-up at this time and she is comfortable with discharge and following up with her PCP.  I gave my usual customary return precautions.   [CF]    Clinical Course User Index [CF] Loleta RoseForbach, Quinto Tippy, MD  ____________________________________________  FINAL CLINICAL IMPRESSION(S) / ED DIAGNOSES  Final diagnoses:  Migraine without status migrainosus, not intractable, unspecified migraine type     MEDICATIONS GIVEN DURING THIS VISIT:  Medications  metoCLOPramide (REGLAN) injection 10 mg (10 mg Intravenous Given 07/11/19 0021)  sodium chloride 0.9 % bolus 500 mL (500 mLs Intravenous New Bag/Given 07/11/19 0019)  ketorolac (TORADOL) 30 MG/ML injection 15 mg (15 mg Intravenous Given 07/11/19 0020)  diphenhydrAMINE (BENADRYL) injection 25 mg (25 mg Intravenous Given 07/11/19 0020)  dexamethasone (DECADRON) injection 10 mg (10 mg Intravenous Given 07/11/19 0021)     ED Discharge Orders    None      *Please note:  Kathryn Halleralika L Kuhlmann was evaluated in Emergency Department on 07/11/2019 for the symptoms  described in the history of present illness. She was evaluated in the context of the global COVID-19 pandemic, which necessitated consideration that the patient might be at risk for infection with the SARS-CoV-2 virus that causes COVID-19. Institutional protocols and algorithms that pertain to the evaluation of patients at risk for COVID-19 are in a state of rapid change based on information released by regulatory bodies including the CDC and federal and state organizations. These policies and algorithms were followed during the patient's care in the ED.  Some ED evaluations and interventions may be delayed as a result of limited staffing during the pandemic.*  Note:  This document was prepared using Dragon voice recognition software and may include unintentional dictation errors.   Loleta RoseForbach, Madine Sarr, MD 07/11/19 [id number]

## 2019-09-14 ENCOUNTER — Encounter (HOSPITAL_COMMUNITY): Payer: Self-pay | Admitting: Emergency Medicine

## 2019-09-14 ENCOUNTER — Emergency Department (HOSPITAL_COMMUNITY): Payer: Medicaid Other

## 2019-09-14 ENCOUNTER — Emergency Department (HOSPITAL_COMMUNITY)
Admission: EM | Admit: 2019-09-14 | Discharge: 2019-09-14 | Disposition: A | Discharge status: Home / Self Care | Payer: Medicaid Other | Attending: Emergency Medicine | Admitting: Emergency Medicine

## 2019-09-14 ENCOUNTER — Other Ambulatory Visit: Payer: Self-pay

## 2019-09-14 DIAGNOSIS — J4541 Moderate persistent asthma with (acute) exacerbation: Secondary | ICD-10-CM | POA: Diagnosis not present

## 2019-09-14 DIAGNOSIS — R062 Wheezing: Secondary | ICD-10-CM | POA: Diagnosis present

## 2019-09-14 DIAGNOSIS — Z79899 Other long term (current) drug therapy: Secondary | ICD-10-CM | POA: Diagnosis not present

## 2019-09-14 MED ORDER — PREDNISONE 20 MG PO TABS: 60.0000 mg | ORAL_TABLET | Freq: Every day | ORAL | 0 refills | Status: DC

## 2019-09-14 MED ORDER — IPRATROPIUM BROMIDE 0.02 % IN SOLN
2.5000 mL | Freq: Once | RESPIRATORY_TRACT | Status: AC
  Administered 2019-09-14: 0.5 mg via RESPIRATORY_TRACT
  Filled 2019-09-14: qty 2.5

## 2019-09-14 MED ORDER — PREDNISONE 50 MG PO TABS
60.0000 mg | ORAL_TABLET | Freq: Once | ORAL | Status: AC
  Administered 2019-09-14: 60 mg via ORAL
  Filled 2019-09-14: qty 1

## 2019-09-14 MED ORDER — ALBUTEROL SULFATE HFA 108 (90 BASE) MCG/ACT IN AERS
4.0000 | INHALATION_SPRAY | Freq: Once | RESPIRATORY_TRACT | Status: AC
  Administered 2019-09-14: 4 via RESPIRATORY_TRACT
  Filled 2019-09-14: qty 6.7

## 2019-09-14 MED ORDER — ALBUTEROL SULFATE HFA 108 (90 BASE) MCG/ACT IN AERS: 2.0000 | INHALATION_SPRAY | RESPIRATORY_TRACT | 2 refills | Status: DC | PRN

## 2019-09-14 MED ORDER — ALBUTEROL SULFATE HFA 108 (90 BASE) MCG/ACT IN AERS
2.0000 | INHALATION_SPRAY | Freq: Once | RESPIRATORY_TRACT | Status: AC
  Administered 2019-09-14: 2 via RESPIRATORY_TRACT

## 2019-09-14 NOTE — ED Provider Notes (Signed)
[facility] EMERGENCY DEPARTMENT Provider Note   CSN: [id number] Arrival date & time: 09/14/19  3536     History   Chief Complaint No chief complaint on file.   HPI Kathryn Nash is a 23 y.o. female.     Patient with hx asthma, c/o increased wheezing in the past week. States every year at this time of year, has increased wheezing and congestion. Occasional non prod cough. No sore throat or sinus pain/drainage. Denies fever or chills. No known covid exposure, and states had covid test negative last week. Denies leg pain or swelling. No chest pain or discomfort. Non smoker. Had run out of inhaler at home in past weeks. w asthma, denies prior admission or recent steroid use.   The history is provided by the patient.    Past Medical History:  Diagnosis Date  . Asthma     Patient Active Problem List   Diagnosis Date Noted  . Asthma exacerbation 12/08/2018    History reviewed. No pertinent surgical history.   OB History    Gravida  1   Para      Term      Preterm      AB      Living        SAB      TAB      Ectopic      Multiple      Live Births               Home Medications    Prior to Admission medications   Medication Sig Start Date End Date Taking? Authorizing Provider  albuterol (VENTOLIN HFA) 108 (90 Base) MCG/ACT inhaler Inhale 2 puffs into the lungs every 6 (six) hours as needed for wheezing or shortness of breath. 04/14/19  Yes Earleen Newport, MD  Fluticasone-Salmeterol (ADVAIR DISKUS) 250-50 MCG/DOSE AEPB Inhale 1 puff into the lungs 2 (two) times daily. Patient not taking: Reported on 09/14/2019 04/14/19 04/13/20  Earleen Newport, MD  loratadine (CLARITIN) 10 MG tablet Take 1 tablet (10 mg total) by mouth daily. Patient not taking: Reported on 09/14/2019 12/11/18   Bettey Costa, MD    Family History No family history on file.  Social History Social History   Tobacco Use  . Smoking status: Never Smoker  . Smokeless  tobacco: Never Used  Substance Use Topics  . Alcohol use: No  . Drug use: No     Allergies   Iohexol, Eggs or egg-derived products, Montelukast sodium, and Penicillins   Review of Systems Review of Systems  Constitutional: Negative for chills and fever.  HENT: Negative for sore throat.   Eyes: Negative for redness.  Respiratory: Positive for cough, shortness of breath and wheezing.   Cardiovascular: Negative for chest pain.  Gastrointestinal: Negative for abdominal pain and vomiting.  Endocrine: Negative for polyuria.  Genitourinary: Negative for flank pain.  Musculoskeletal: Negative for back pain and neck pain.  Skin: Negative for rash.  Neurological: Negative for headaches.  Hematological: Does not bruise/bleed easily.  Psychiatric/Behavioral: Negative for confusion.     Physical Exam Updated Vital Signs BP 103/77 (BP Location: Right Arm)   Pulse 94   Temp 98.5 F (36.9 C) (Oral)   Resp (!) 22   Ht 1.651 m (5\' 5" )   Wt 95.3 kg   LMP 08/15/2019 (Approximate)   SpO2 93%   BMI 34.95 kg/m   Physical Exam Vitals signs and nursing note reviewed.  Constitutional:  Appearance: Normal appearance. She is well-developed.  HENT:     Head: Atraumatic.     Nose: Nose normal.     Mouth/Throat:     Mouth: Mucous membranes are moist.  Eyes:     General: No scleral icterus.    Conjunctiva/sclera: Conjunctivae normal.  Neck:     Musculoskeletal: Normal range of motion and neck supple. No neck rigidity or muscular tenderness.     Trachea: No tracheal deviation.  Cardiovascular:     Rate and Rhythm: Normal rate and regular rhythm.     Pulses: Normal pulses.     Heart sounds: Normal heart sounds. No murmur. No friction rub. No gallop.   Pulmonary:     Effort: Pulmonary effort is normal. No respiratory distress.     Breath sounds: Wheezing present.  Abdominal:     General: Bowel sounds are normal. There is no distension.     Palpations: Abdomen is soft.      Tenderness: There is no abdominal tenderness.  Genitourinary:    Comments: No cva tenderness.  Musculoskeletal:        General: No swelling.     Right lower leg: No edema.     Left lower leg: No edema.  Skin:    General: Skin is warm and dry.     Findings: No rash.  Neurological:     Mental Status: She is alert.     Comments: Alert, speech normal.   Psychiatric:        Mood and Affect: Mood normal.      ED Treatments / Results  Labs (all labs ordered are listed, but only abnormal results are displayed) Labs Reviewed - No data to display  EKG None  Radiology Dg Chest 2 View  Result Date: 09/14/2019 CLINICAL DATA:  Chest congestion, wheezing and difficulty breathing since yesterday. Hx of asthmashortness of breath EXAM: CHEST - 2 VIEW COMPARISON:  None. FINDINGS: Normal mediastinum and cardiac silhouette. Normal pulmonary vasculature. No evidence of effusion, infiltrate, or pneumothorax. No acute bony abnormality. IMPRESSION: No acute cardiopulmonary process. Electronically Signed   By: Genevive BiStewart  Edmunds M.D.   On: 09/14/2019 08:57    Procedures Procedures (including critical care time)  Medications Ordered in ED Medications  albuterol (VENTOLIN HFA) 108 (90 Base) MCG/ACT inhaler 4 puff (has no administration in time range)  ipratropium (ATROVENT HFA) inhaler 2 puff (has no administration in time range)  predniSONE (DELTASONE) tablet 60 mg (has no administration in time range)  albuterol (VENTOLIN HFA) 108 (90 Base) MCG/ACT inhaler 2 puff (2 puffs Inhalation Given 09/14/19 0831)     Initial Impression / Assessment and Plan / ED Course  I have reviewed the triage vital signs and the nursing notes.  Pertinent labs & imaging results that were available during my care of the patient were reviewed by me and considered in my medical decision making (see chart for details).  CXR.  Albuterol and atrovent mdi treatments.   Persistent wheezing. Prednisone po.  CXR reviewed  by me - no pna. Discussed w pt.   Reviewed nursing notes and prior charts for additional history.   Kathryn Halleralika L Nash was evaluated in Emergency Department on 09/14/2019 for the symptoms described in the history of present illness. She was evaluated in the context of the global COVID-19 pandemic, which necessitated consideration that the patient might be at risk for infection with the SARS-CoV-2 virus that causes COVID-19. Institutional protocols and algorithms that pertain to the evaluation of patients at risk  for COVID-19 are in a state of rapid change based on information released by regulatory bodies including the CDC and federal and state organizations. These policies and algorithms were followed during the patient's care in the ED.  Recheck wheezing resolved, improved air exchange.  Patient states feels much improved and requests d/c to home.    Final Clinical Impressions(s) / ED Diagnoses   Final diagnoses:  None    ED Discharge Orders    None       Cathren Laine, MD 09/14/19 1357

## 2019-09-14 NOTE — ED Triage Notes (Signed)
Chest congestion since yesterday, pt states she gets this every year around this time.

## 2019-09-14 NOTE — Discharge Instructions (Addendum)
It was our pleasure to provide your ER care today - we hope that you feel better.  Take prednisone as prescribed.   Use albuterol treatment every 4 hours as need.   Follow up with primary care doctor in the next couple days for recheck if symptoms fail to improve/resolve.  Return to ER if worse, new symptoms, increased trouble breathing, or other concern.

## 2019-10-13 ENCOUNTER — Ambulatory Visit: Payer: Self-pay

## 2019-10-20 ENCOUNTER — Ambulatory Visit: Payer: Medicaid Other

## 2019-10-20 ENCOUNTER — Other Ambulatory Visit: Payer: Self-pay

## 2019-10-20 ENCOUNTER — Ambulatory Visit: Payer: Medicaid Other | Admitting: Advanced Practice Midwife

## 2019-10-20 DIAGNOSIS — B9689 Other specified bacterial agents as the cause of diseases classified elsewhere: Secondary | ICD-10-CM

## 2019-10-20 DIAGNOSIS — N760 Acute vaginitis: Secondary | ICD-10-CM

## 2019-10-20 DIAGNOSIS — Z113 Encounter for screening for infections with a predominantly sexual mode of transmission: Secondary | ICD-10-CM

## 2019-10-20 MED ORDER — METRONIDAZOLE 500 MG PO TABS: 500.0000 mg | ORAL_TABLET | Freq: Two times a day (BID) | ORAL | 0 refills | Status: AC

## 2019-10-20 NOTE — Progress Notes (Signed)
Wet mount reviewed and pt treated for BV per Ola Spurr, CNM verbal orders. Provider orders completed.Ronny Bacon, RN

## 2019-10-20 NOTE — Progress Notes (Signed)
    STI clinic/screening visit  Subjective:  Kathryn Nash is a 23 y.o.G1P0 BF female being seen today for an STI screening visit. The patient reports they do not have symptoms.  Patient has the following medical conditions:   Patient Active Problem List   Diagnosis Date Noted  . Morbid obesity ([place]) 210 lbs 10/20/2019  . Asthma exacerbation 12/08/2018     No chief complaint on file.   HPI  Patient reports no symptoms.   Last sex 09/17/19.  LMP 09/07/19.  Last MJ 2015.  See flowsheet for further details and programmatic requirements.    The following portions of the patient's history were reviewed and updated as appropriate: allergies, current medications, past medical history, past social history, past surgical history and problem list.  Objective:  There were no vitals filed for this visit.  Physical Exam Vitals signs and nursing note reviewed.  Constitutional:      Appearance: Normal appearance.  HENT:     Head: Normocephalic and atraumatic.     Mouth/Throat:     Mouth: Mucous membranes are moist.     Pharynx: Oropharynx is clear. No oropharyngeal exudate or posterior oropharyngeal erythema.  Pulmonary:     Effort: Pulmonary effort is normal.  Abdominal:     General: Abdomen is flat.     Palpations: Abdomen is soft. There is no mass.     Tenderness: There is no abdominal tenderness. There is no rebound.     Comments: Soft without tenderness  Genitourinary:    General: Normal vulva.     Exam position: Lithotomy position.     Pubic Area: No rash or pubic lice.      Labia:        Right: No rash or lesion.        Left: No rash or lesion.      Vagina: Vaginal discharge (grey malodorous leukorrhea, ph inconclusive) present. No erythema, bleeding or lesions.     Cervix: Normal.     Uterus: Normal.      Adnexa: Right adnexa normal and left adnexa normal.     Rectum: Normal.  Lymphadenopathy:     Head:     Right side of head: No preauricular or posterior auricular  adenopathy.     Left side of head: No preauricular or posterior auricular adenopathy.     Cervical: No cervical adenopathy.     Upper Body:     Right upper body: No supraclavicular or axillary adenopathy.     Left upper body: No supraclavicular or axillary adenopathy.     Lower Body: No right inguinal adenopathy. No left inguinal adenopathy.  Skin:    General: Skin is warm and dry.     Findings: No rash.  Neurological:     Mental Status: She is alert and oriented to person, place, and time.       Assessment and Plan:  Kathryn Nash is a 23 y.o. female presenting to the [facility] Department for STI screening  1. Morbid obesity ([place]) 210 lbs   2. Screening examination for venereal disease Treat wet mount per standing orders Immunization nurse consult - WET PREP FOR [place], YEAST, CLUE - Syphilis Serology, [place] [facility] - HIV [place] [facility] - Chlamydia/Gonorrhea [place] [facility]     No follow-ups on file.  No future appointments.  Herbie Saxon, CNM

## 2019-10-21 LAB — WET PREP FOR TRICH, YEAST, CLUE
Trichomonas Exam: NEGATIVE
Yeast Exam: NEGATIVE

## 2019-10-27 ENCOUNTER — Ambulatory Visit: Payer: Medicaid Other

## 2019-12-11 ENCOUNTER — Emergency Department (HOSPITAL_COMMUNITY)
Admission: EM | Admit: 2019-12-11 | Discharge: 2019-12-11 | Disposition: A | Discharge status: Home / Self Care | Payer: Medicaid Other | Attending: Emergency Medicine | Admitting: Emergency Medicine

## 2019-12-11 ENCOUNTER — Encounter (HOSPITAL_COMMUNITY): Payer: Self-pay | Admitting: Emergency Medicine

## 2019-12-11 ENCOUNTER — Other Ambulatory Visit: Payer: Self-pay

## 2019-12-11 DIAGNOSIS — R1030 Lower abdominal pain, unspecified: Secondary | ICD-10-CM | POA: Insufficient documentation

## 2019-12-11 DIAGNOSIS — Z5321 Procedure and treatment not carried out due to patient leaving prior to being seen by health care provider: Secondary | ICD-10-CM | POA: Insufficient documentation

## 2019-12-11 NOTE — ED Triage Notes (Signed)
Pt c/o mid to lower abd pains for month that are intermittent. Also hasnt had menstrual cycle in 2 months.

## 2019-12-13 ENCOUNTER — Other Ambulatory Visit: Payer: Self-pay

## 2019-12-13 ENCOUNTER — Emergency Department (HOSPITAL_COMMUNITY)
Admission: EM | Admit: 2019-12-13 | Discharge: 2019-12-13 | Disposition: A | Discharge status: Home / Self Care | Payer: Medicaid Other | Attending: Emergency Medicine | Admitting: Emergency Medicine

## 2019-12-13 ENCOUNTER — Encounter (HOSPITAL_COMMUNITY): Payer: Self-pay

## 2019-12-13 DIAGNOSIS — N898 Other specified noninflammatory disorders of vagina: Secondary | ICD-10-CM | POA: Diagnosis not present

## 2019-12-13 DIAGNOSIS — R1030 Lower abdominal pain, unspecified: Secondary | ICD-10-CM

## 2019-12-13 DIAGNOSIS — J45909 Unspecified asthma, uncomplicated: Secondary | ICD-10-CM | POA: Diagnosis not present

## 2019-12-13 DIAGNOSIS — B9689 Other specified bacterial agents as the cause of diseases classified elsewhere: Secondary | ICD-10-CM

## 2019-12-13 DIAGNOSIS — N926 Irregular menstruation, unspecified: Secondary | ICD-10-CM

## 2019-12-13 DIAGNOSIS — N760 Acute vaginitis: Secondary | ICD-10-CM | POA: Insufficient documentation

## 2019-12-13 LAB — COMPREHENSIVE METABOLIC PANEL
ALT: 19 U/L (ref 0–44)
AST: 19 U/L (ref 15–41)
Albumin: 4.1 g/dL (ref 3.5–5.0)
Alkaline Phosphatase: 65 U/L (ref 38–126)
Anion gap: 7 (ref 5–15)
BUN: 10 mg/dL (ref 6–20)
CO2: 24 mmol/L (ref 22–32)
Calcium: 9 mg/dL (ref 8.9–10.3)
Chloride: 105 mmol/L (ref 98–111)
Creatinine, Ser: 0.83 mg/dL (ref 0.44–1.00)
GFR calc Af Amer: >60 mL/min (ref >60)
GFR calc non Af Amer: >60 mL/min (ref >60)
Glucose, Bld: 97 mg/dL (ref 70–99)
Potassium: 3.5 mmol/L (ref 3.5–5.1)
Sodium: 136 mmol/L (ref 135–145)
Total Bilirubin: 0.7 mg/dL (ref 0.3–1.2)
Total Protein: 7.6 g/dL (ref 6.5–8.1)

## 2019-12-13 LAB — URINALYSIS, ROUTINE W REFLEX MICROSCOPIC
Bilirubin Urine: NEGATIVE
Glucose, UA: NEGATIVE mg/dL
Hgb urine dipstick: NEGATIVE
Ketones, ur: 5 mg/dL — AB
Nitrite: NEGATIVE
Protein, ur: 30 mg/dL — AB
Specific Gravity, Urine: 1.033 — ABNORMAL HIGH (ref 1.005–1.030)
pH: 5 (ref 5.0–8.0)

## 2019-12-13 LAB — CBC
HCT: 39.2 % (ref 36.0–46.0)
Hemoglobin: 13.3 g/dL (ref 12.0–15.0)
MCH: 31.1 pg (ref 26.0–34.0)
MCHC: 33.9 g/dL (ref 30.0–36.0)
MCV: 91.6 fL (ref 80.0–100.0)
Platelets: 423 10*3/uL — ABNORMAL HIGH (ref 150–400)
RBC: 4.28 MIL/uL (ref 3.87–5.11)
RDW: 12.6 % (ref 11.5–15.5)
WBC: 15.9 10*3/uL — ABNORMAL HIGH (ref 4.0–10.5)
nRBC: 0 % (ref 0.0–0.2)

## 2019-12-13 LAB — WET PREP, GENITAL
Sperm: NONE SEEN
Trich, Wet Prep: NONE SEEN
Yeast Wet Prep HPF POC: NONE SEEN

## 2019-12-13 LAB — I-STAT BETA HCG BLOOD, ED (MC, WL, AP ONLY): I-stat hCG, quantitative: <5 m[IU]/mL (ref <5)

## 2019-12-13 LAB — LIPASE, BLOOD: Lipase: 20 U/L (ref 11–51)

## 2019-12-13 MED ORDER — METRONIDAZOLE 0.75 % VA GEL: 1.0000 | Freq: Two times a day (BID) | VAGINAL | 0 refills | Status: AC

## 2019-12-13 MED ORDER — SODIUM CHLORIDE 0.9% FLUSH: 3.0000 mL | Freq: Once | INTRAVENOUS | Status: DC

## 2019-12-13 NOTE — ED Provider Notes (Signed)
[facility]-EMERGENCY DEPT Provider Note   CSN: [id number] Arrival date & time: 12/13/19  0043     History Chief Complaint  Patient presents with  . Abdominal Pain    Kathryn Nash is a 24 y.o. female G1P0010 with a hx of asthma presents to the Emergency Department complaining of intermittent lower abdominal pain onset 3-4 weeks ago.  Patient reports the pain is sharp and stabbing, lasting several seconds and then resolving spontaneously.  She reports that happens 2-3 times per week.  She also reports new menstrual cycle for the last 2 months.  She is sexually active with one female partner.  She is not concerned about possible STDs.  No treatments prior to arrival.  No known aggravating or alleviating factors.  She denies fever, chills, headache, neck pain, chest pain, shortness of breath, nausea, vomiting, diarrhea, weakness, dizziness, syncope.  The history is provided by the patient and medical records. No language interpreter was used.       Past Medical History:  Diagnosis Date  . Asthma     Patient Active Problem List   Diagnosis Date Noted  . Morbid obesity (HCC) 210 lbs 10/20/2019  . Asthma exacerbation 12/08/2018    History reviewed. No pertinent surgical history.   OB History    Gravida  1   Para      Term      Preterm      AB      Living        SAB      TAB      Ectopic      Multiple      Live Births              History reviewed. No pertinent family history.  Social History   Tobacco Use  . Smoking status: Never Smoker  . Smokeless tobacco: Never Used  Substance Use Topics  . Alcohol use: No  . Drug use: No    Home Medications Prior to Admission medications   Medication Sig Start Date End Date Taking? Authorizing Provider  albuterol (VENTOLIN HFA) 108 (90 Base) MCG/ACT inhaler Inhale 2 puffs into the lungs every 6 (six) hours as needed for wheezing or shortness of breath. Patient not taking: Reported on  12/13/2019 04/14/19   Emily Filbert, MD  albuterol (VENTOLIN HFA) 108 (90 Base) MCG/ACT inhaler Inhale 2 puffs into the lungs every 4 (four) hours as needed for wheezing or shortness of breath. 09/14/19   Cathren Laine, MD  Fluticasone-Salmeterol (ADVAIR DISKUS) 250-50 MCG/DOSE AEPB Inhale 1 puff into the lungs 2 (two) times daily. Patient not taking: Reported on 09/14/2019 04/14/19 04/13/20  Emily Filbert, MD  loratadine (CLARITIN) 10 MG tablet Take 1 tablet (10 mg total) by mouth daily. Patient not taking: Reported on 09/14/2019 12/11/18   Adrian Saran, MD  metroNIDAZOLE (METROGEL VAGINAL) 0.75 % vaginal gel Place 1 Applicatorful vaginally 2 (two) times daily for 5 days. 12/13/19 12/18/19  Henery Betzold, Dahlia Client, PA-C  predniSONE (DELTASONE) 20 MG tablet Take 3 tablets (60 mg total) by mouth daily. Patient not taking: Reported on 12/13/2019 09/15/19   Cathren Laine, MD    Allergies    Iohexol, Eggs or egg-derived products, Montelukast sodium, and Penicillins  Review of Systems   Review of Systems  Constitutional: Negative for appetite change, diaphoresis, fatigue, fever and unexpected weight change.  HENT: Negative for mouth sores.   Eyes: Negative for visual disturbance.  Respiratory: Negative for cough, chest tightness,  shortness of breath and wheezing.   Cardiovascular: Negative for chest pain.  Gastrointestinal: Positive for abdominal pain. Negative for constipation, diarrhea, nausea and vomiting.  Endocrine: Negative for polydipsia, polyphagia and polyuria.  Genitourinary: Positive for vaginal discharge ( Unchanged from baseline). Negative for dysuria, frequency, hematuria and urgency.  Musculoskeletal: Negative for back pain and neck stiffness.  Skin: Negative for rash.  Allergic/Immunologic: Negative for immunocompromised state.  Neurological: Negative for syncope, light-headedness and headaches.  Hematological: Does not bruise/bleed easily.  Psychiatric/Behavioral: Negative  for sleep disturbance. The patient is not nervous/anxious.     Physical Exam Updated Vital Signs BP 121/71   Pulse (!) 33   Temp 98.7 F (37.1 C) (Oral)   Resp 16   LMP 10/11/2019 (Within Weeks)   SpO2 98%   Physical Exam Vitals and nursing note reviewed. Exam conducted with a chaperone present.  Constitutional:      General: She is not in acute distress.    Appearance: She is not diaphoretic.  HENT:     Head: Normocephalic.  Eyes:     General: No scleral icterus.    Conjunctiva/sclera: Conjunctivae normal.  Cardiovascular:     Rate and Rhythm: Normal rate and regular rhythm.     Pulses: Normal pulses.          Radial pulses are 2+ on the right side and 2+ on the left side.  Pulmonary:     Effort: No tachypnea, accessory muscle usage, prolonged expiration, respiratory distress or retractions.     Breath sounds: No stridor.     Comments: Equal chest rise. No increased work of breathing. Abdominal:     General: There is no distension.     Palpations: Abdomen is soft.     Tenderness: There is no abdominal tenderness. There is no guarding or rebound.     Hernia: There is no hernia in the left inguinal area or right inguinal area.  Genitourinary:    Exam position: Supine.     Labia:        Right: No rash.        Left: No rash.      Vagina: Vaginal discharge ( Moderate amount of thick, white discharge) present. No bleeding.     Cervix: No cervical bleeding.     Uterus: Normal.      Adnexa: Right adnexa normal and left adnexa normal.       Right: No mass or tenderness.         Left: No mass or tenderness.    Musculoskeletal:     Cervical back: Normal range of motion.     Comments: Moves all extremities equally and without difficulty.  Lymphadenopathy:     Lower Body: No right inguinal adenopathy. No left inguinal adenopathy.  Skin:    General: Skin is warm and dry.     Capillary Refill: Capillary refill takes less than 2 seconds.  Neurological:     Mental Status: She  is alert.     GCS: GCS eye subscore is 4. GCS verbal subscore is 5. GCS motor subscore is 6.     Comments: Speech is clear and goal oriented.  Psychiatric:        Mood and Affect: Mood normal.     ED Results / Procedures / Treatments   Labs (all labs ordered are listed, but only abnormal results are displayed) Labs Reviewed  WET PREP, GENITAL - Abnormal; Notable for the following components:      Result Value  Clue Cells Wet Prep HPF POC PRESENT (*)    WBC, Wet Prep HPF POC PRESENT (*)    All other components within normal limits  CBC - Abnormal; Notable for the following components:   WBC 15.9 (*)    Platelets 423 (*)    All other components within normal limits  URINALYSIS, ROUTINE W REFLEX MICROSCOPIC - Abnormal; Notable for the following components:   Color, Urine AMBER (*)    APPearance HAZY (*)    Specific Gravity, Urine 1.033 (*)    Ketones, ur 5 (*)    Protein, ur 30 (*)    Leukocytes,Ua TRACE (*)    Bacteria, UA RARE (*)    All other components within normal limits  URINE CULTURE  LIPASE, BLOOD  COMPREHENSIVE METABOLIC PANEL  I-STAT BETA HCG BLOOD, ED ([facility], [facility], [facility] ONLY)  GC/CHLAMYDIA PROBE AMP ([place]) NOT AT [facility]     Procedures Procedures (including critical care time)  Medications Ordered in ED Medications  sodium chloride flush (NS) 0.9 % injection 3 mL (has no administration in time range)    ED Course  I have reviewed the triage vital signs and the nursing notes.  Pertinent labs & imaging results that were available during my care of the patient were reviewed by me and considered in my medical decision making (see chart for details).  Clinical Course as of Dec 12 624  Nancy Fetter Dec 13, 2019  0625 Negative pregnancy test  I-stat hCG, quantitative: <5.0 [HM]    Clinical Course User Index [HM] Hessie Varone, Gwenlyn Perking   MDM Rules/Calculators/A&P                      Patient presents with brief, intermittent lower abdominal pain onset 3 weeks  ago.  Abdominal exam today is reassuring.  She is afebrile.  Patient noted to have leukocytosis however this appears to be consistent with previous lab values.  Given elevation in platelets as well and increase specific gravity of her urine suspect some element of hemoconcentration to this.  Urinalysis with increased specific gravity and leukocytes but no white blood cells and rare bacteria.  Given her lack of urinary symptoms highly doubt UTI as the cause of her lower abdominal pain.  Urine culture sent.  Pelvic exam reassuring.  No clinical evidence of PID.  Wet prep does show clue cells.  Will treat for BV and have patient follow-up with OB/GYN.  Discussed findings and treatment with patient, she states understanding and is in agreement with the plan.   Final Clinical Impression(s) / ED Diagnoses Final diagnoses:  Lower abdominal pain  BV (bacterial vaginosis)  Missed menses    Rx / DC Orders ED Discharge Orders         Ordered    metroNIDAZOLE (METROGEL VAGINAL) 0.75 % vaginal gel  2 times daily     12/13/19 0625           Aniqua Briere, Jarrett Soho, PA-C 12/22/19 0747    Ripley Fraise, MD 12/22/19 1404

## 2019-12-13 NOTE — Discharge Instructions (Addendum)
1. Medications: Metrogel, usual home medications 2. Treatment: rest, drink plenty of fluids, advance diet slowly 3. Follow Up: Please followup with OB/GYN in 1 week for further evaluation of your missed menstrual cycle and lower abdominal pain; Please return to the ER for persistent vomiting, high fevers or worsening symptoms

## 2019-12-13 NOTE — ED Triage Notes (Signed)
Pt reports lower abdominal and pelvic pain intermittently for a month. She also states that she hasn't had a menstrual cycle in almost 2 months. Reports that she vomited once today. A&Ox4. Ambulatory.

## 2019-12-14 LAB — URINE CULTURE

## 2019-12-15 LAB — GC/CHLAMYDIA PROBE AMP (~~LOC~~) NOT AT ARMC
Chlamydia: NEGATIVE
Neisseria Gonorrhea: NEGATIVE

## 2020-01-21 ENCOUNTER — Encounter (HOSPITAL_COMMUNITY): Payer: Self-pay

## 2020-01-21 ENCOUNTER — Other Ambulatory Visit: Payer: Self-pay

## 2020-01-21 ENCOUNTER — Emergency Department (HOSPITAL_COMMUNITY)
Admission: EM | Admit: 2020-01-21 | Discharge: 2020-01-21 | Disposition: A | Discharge status: Home / Self Care | Payer: Medicaid Other | Attending: Emergency Medicine | Admitting: Emergency Medicine

## 2020-01-21 DIAGNOSIS — J4521 Mild intermittent asthma with (acute) exacerbation: Secondary | ICD-10-CM | POA: Insufficient documentation

## 2020-01-21 DIAGNOSIS — R062 Wheezing: Secondary | ICD-10-CM | POA: Diagnosis present

## 2020-01-21 MED ORDER — PREDNISONE 20 MG PO TABS: 60.0000 mg | ORAL_TABLET | Freq: Every day | ORAL | 0 refills | Status: DC

## 2020-01-21 MED ORDER — PREDNISONE 20 MG PO TABS: 60.0000 mg | ORAL_TABLET | Freq: Every day | ORAL | 0 refills | Status: AC

## 2020-01-21 NOTE — ED Triage Notes (Signed)
Patient states she needs a refill of Prednisone for her asthma.

## 2020-01-21 NOTE — Discharge Instructions (Addendum)
Please call today to make an appointment with [facility] health wellness clinic.  Please take prednisone as prescribed.

## 2020-01-21 NOTE — ED Notes (Signed)
Discharge paperwork and prescription reviewed with pt.  Pt with no questions or concerns at this time.  Ambulatory at discharge.

## 2020-01-21 NOTE — ED Provider Notes (Signed)
[facility]-EMERGENCY DEPT Provider Note   CSN: [id number] Arrival date & time: 01/21/20  1207     History Chief Complaint  Patient presents with  . Asthma    Kathryn Nash is a 24 y.o. female.  HPI  Patient is 24 year old female with history of asthma with no other significant past medical history presented today for complaints of wheezing for the past week.  Patient states that she gets annual asthma exacerbations and has to take several days of prednisone to help with her symptoms.  She states that she believes her symptoms are triggered by the weather.  She is never been intubated or been hospitalized for asthma and states that she uses albuterol as her only inhaler.  States she has never been on any inhaled corticosteroids in the past.  She has not currently seeing a PCP or pulmonologist as she recently moved to the area.  She states that she needs to be established.  Does not currently have insurance.  Any chest pain, fevers, chills, headache, dizziness, shortness of breath.  She states that she is breathing well and often feels that especially at night her breathing keeps her up.  Patient states that she has been using her albuterol inhaler 3-4 times a day since she has been feeling this way however she states that you she usually only uses it as needed which is generally several times a week.    Past Medical History:  Diagnosis Date  . Asthma     Patient Active Problem List   Diagnosis Date Noted  . Morbid obesity (HCC) 210 lbs 10/20/2019  . Asthma exacerbation 12/08/2018    History reviewed. No pertinent surgical history.   OB History    Gravida  1   Para      Term      Preterm      AB      Living        SAB      TAB      Ectopic      Multiple      Live Births              Family History  Problem Relation Age of Onset  . Asthma Mother   . Asthma Father     Social History   Tobacco Use  . Smoking status: Never  Smoker  . Smokeless tobacco: Never Used  Substance Use Topics  . Alcohol use: No  . Drug use: No    Home Medications Prior to Admission medications   Medication Sig Start Date End Date Taking? Authorizing Provider  albuterol (VENTOLIN HFA) 108 (90 Base) MCG/ACT inhaler Inhale 2 puffs into the lungs every 6 (six) hours as needed for wheezing or shortness of breath. Patient not taking: Reported on 12/13/2019 04/14/19   Emily Filbert, MD  albuterol (VENTOLIN HFA) 108 (90 Base) MCG/ACT inhaler Inhale 2 puffs into the lungs every 4 (four) hours as needed for wheezing or shortness of breath. 09/14/19   Cathren Laine, MD  Fluticasone-Salmeterol (ADVAIR DISKUS) 250-50 MCG/DOSE AEPB Inhale 1 puff into the lungs 2 (two) times daily. Patient not taking: Reported on 09/14/2019 04/14/19 04/13/20  Emily Filbert, MD  loratadine (CLARITIN) 10 MG tablet Take 1 tablet (10 mg total) by mouth daily. Patient not taking: Reported on 09/14/2019 12/11/18   Adrian Saran, MD  predniSONE (DELTASONE) 20 MG tablet Take 3 tablets (60 mg total) by mouth daily for 5 days. 01/21/20 01/26/20  Blanchie Dessert,  Bradin Mcadory S, PA    Allergies    Iohexol, Eggs or egg-derived products, Montelukast sodium, and Penicillins  Review of Systems   Review of Systems  Constitutional: Negative for chills and fever.  HENT: Negative for congestion.   Eyes: Negative for pain.  Respiratory: Positive for wheezing. Negative for cough and shortness of breath.   Cardiovascular: Negative for chest pain and leg swelling.  Gastrointestinal: Negative for abdominal pain and vomiting.  Genitourinary: Negative for dysuria.  Musculoskeletal: Negative for myalgias.  Skin: Negative for rash.  Neurological: Negative for dizziness and headaches.    Physical Exam Updated Vital Signs BP 128/79 (BP Location: Right Arm)   Pulse 87   Temp 98.3 F (36.8 C) (Oral)   Resp 18   Ht 5\' 6"  (1.676 m)   Wt 95.3 kg   SpO2 99%   BMI 33.89 kg/m   Physical  Exam Vitals and nursing note reviewed.  Constitutional:      General: She is not in acute distress.    Comments: Patient is 24 year old female no acute distress sitting comfortably in bed speaking full sentences and answering questions appropriately and following commands  HENT:     Head: Normocephalic and atraumatic.     Nose: Nose normal.     Mouth/Throat:     Mouth: Mucous membranes are moist.  Eyes:     General: No scleral icterus. Cardiovascular:     Rate and Rhythm: Normal rate and regular rhythm.     Pulses: Normal pulses.     Heart sounds: Normal heart sounds.  Pulmonary:     Effort: Pulmonary effort is normal. No respiratory distress.     Breath sounds: No wheezing.     Comments: Audible, diffuse end expiratory wheezing with no increased work of breathing or tachypnea.  Satting 99% on room air.  Speaking full sentences without difficulty. Abdominal:     Palpations: Abdomen is soft.     Tenderness: There is no abdominal tenderness. There is no guarding or rebound.  Musculoskeletal:     Cervical back: Normal range of motion.     Right lower leg: No edema.     Left lower leg: No edema.     Comments: No lower extremity swelling or tenderness.  No calf edema, calf tenderness, negative Homan sign.  Skin:    General: Skin is warm and dry.     Capillary Refill: Capillary refill takes less than 2 seconds.  Neurological:     Mental Status: She is alert. Mental status is at baseline.  Psychiatric:        Mood and Affect: Mood normal.        Behavior: Behavior normal.     ED Results / Procedures / Treatments   Labs (all labs ordered are listed, but only abnormal results are displayed) Labs Reviewed - No data to display  EKG None  Radiology No results found.  Procedures Procedures (including critical care time)  Medications Ordered in ED Medications - No data to display  ED Course  I have reviewed the triage vital signs and the nursing notes.  Pertinent labs &  imaging results that were available during my care of the patient were reviewed by me and considered in my medical decision making (see chart for details).  Patient is well-appearing 24 year old female with no significant past medical history other than asthma which she controls with albuterol.  She states that she has never been intubated or hospitalized for her asthma in the past.  She states that her symptoms are actually relatively well controlled although approximately once a year during the winter she has an asthma exacerbation has used prednisone.  She states that her symptoms seem to be occurring today.  She has had ongoing symptoms for the past week states that she feels that she is wheezing but is not particularly short of breath.  Denies any cough cold or congestion.  Denies any concern for Covid.  She would like to defer being tested today.  She has no other symptoms of such.  No known exposure.  Doubt pulmonary embolism as patient is PERC negative, doubt ACS that she has healthy young and has no chest pain, doubt CHF/pneumothorax/pneumonia.  Patient is afebrile, well-appearing, lungs are clear to auscultation bilaterally she has no peripheral edema.  Patient has no acute respiratory distress.  I discussed with patient I plan to discharge her home with prednisone.  She states she would prefer this.  I gave her strict return precautions.   JAIMA JANNEY was evaluated in Emergency Department on 01/21/2020 for the symptoms described in the history of present illness. She was evaluated in the context of the global COVID-19 pandemic, which necessitated consideration that the patient might be at risk for infection with the SARS-CoV-2 virus that causes COVID-19. Institutional protocols and algorithms that pertain to the evaluation of patients at risk for COVID-19 are in a state of rapid change based on information released by regulatory bodies including the CDC and federal and state organizations.  These policies and algorithms were followed during the patient's care in the ED. The medical records were personally reviewed by myself. I personally reviewed all lab results and interpreted all imaging studies and either concurred with their official read or contacted radiology for clarification.   This patient appears reasonably screened and I doubt any other medical condition requiring further workup, evaluation, or treatment in the ED at this time prior to discharge.   Patient's vitals are WNL apart from vital sign abnormalities discussed above, patient is in NAD, and able to ambulate in the ED at their baseline and able to tolerate PO.  Pain has been managed or a plan has been made for home management and has no complaints prior to discharge. Patient is comfortable with above plan and for discharge at this time. All questions were answered prior to disposition. Results from the ER workup discussed with the patient face to face and all questions answered to the best of my ability. The patient is safe for discharge with strict return precautions. Patient appears safe for discharge with appropriate follow-up. Conveyed my impression with the patient and they voiced understanding and are agreeable to plan.   An After Visit Summary was printed and given to the patient.  Portions of this note were generated with [employer]. Dictation errors may occur despite best attempts at proofreading.      MDM Rules/Calculators/A&P                       Final Clinical Impression(s) / ED Diagnoses Final diagnoses:  Mild intermittent asthma with acute exacerbation    Rx / DC Orders ED Discharge Orders         Ordered    predniSONE (DELTASONE) 20 MG tablet  Daily,   Status:  Discontinued     01/21/20 1229    predniSONE (DELTASONE) 20 MG tablet  Daily     01/21/20 1229  Solon Augusta [place], [place] 01/21/20 1239    Lorre Nick, MD 01/22/20 [phone]

## 2020-02-15 ENCOUNTER — Other Ambulatory Visit: Payer: Self-pay

## 2020-02-15 ENCOUNTER — Emergency Department (HOSPITAL_COMMUNITY)
Admission: EM | Admit: 2020-02-15 | Discharge: 2020-02-15 | Disposition: A | Discharge status: Home / Self Care | Payer: Medicaid Other | Attending: Emergency Medicine | Admitting: Emergency Medicine

## 2020-02-15 ENCOUNTER — Encounter (HOSPITAL_COMMUNITY): Payer: Self-pay

## 2020-02-15 DIAGNOSIS — Z79899 Other long term (current) drug therapy: Secondary | ICD-10-CM | POA: Diagnosis not present

## 2020-02-15 DIAGNOSIS — R0602 Shortness of breath: Secondary | ICD-10-CM | POA: Diagnosis present

## 2020-02-15 DIAGNOSIS — J45901 Unspecified asthma with (acute) exacerbation: Secondary | ICD-10-CM

## 2020-02-15 MED ORDER — PREDNISONE 50 MG PO TABS: 50.0000 mg | ORAL_TABLET | Freq: Every day | ORAL | 0 refills | Status: AC

## 2020-02-15 MED ORDER — ALBUTEROL SULFATE HFA 108 (90 BASE) MCG/ACT IN AERS
4.0000 | INHALATION_SPRAY | Freq: Once | RESPIRATORY_TRACT | Status: AC
  Administered 2020-02-15: 4 via RESPIRATORY_TRACT
  Filled 2020-02-15: qty 6.7

## 2020-02-15 MED ORDER — PREDNISONE 20 MG PO TABS
60.0000 mg | ORAL_TABLET | Freq: Once | ORAL | Status: AC
  Administered 2020-02-15: 60 mg via ORAL
  Filled 2020-02-15: qty 3

## 2020-02-15 NOTE — ED Triage Notes (Signed)
Pt arrived POV ambulatory into ED CC Asthma exacerbation X worsening over the past week. Pt reports being out of her rescue inhaler.    VSS Afebrile. Pt no is obvious signs of distress.

## 2020-02-15 NOTE — ED Notes (Signed)
Pt verbalizes understanding of DC instructions. Pt belongings returned and is ambulatory out of ED.    Pt left before signing signature pad  

## 2020-02-15 NOTE — ED Provider Notes (Signed)
[facility]-EMERGENCY DEPT Provider Note   CSN: [id number] Arrival date & time: 02/15/20  1850     History Chief Complaint  Patient presents with  . Asthma    Kathryn Nash is a 24 y.o. female presenting for evaluation of shortness of breath and wheezing.  Patient states she has a history of asthma.  She usually controls this with albuterol, but has been out for the past 2 weeks.  Patient states she has needed up to 40 puffs a day recently, stating that her asthma has been worse.  She has not been able to go to work due to feeling short of breath.  She denies fevers, chills, cough, chest pain, nausea, vomiting or abdominal pain.  She denies Covid contacts.  She has no other medical problems and takes no medications daily.  She does not have a primary care doctor, but is willing to establish with one and be started on maintenance medicine.   HPI     Past Medical History:  Diagnosis Date  . Asthma     Patient Active Problem List   Diagnosis Date Noted  . Morbid obesity (HCC) 210 lbs 10/20/2019  . Asthma exacerbation 12/08/2018    History reviewed. No pertinent surgical history.   OB History    Gravida  1   Para      Term      Preterm      AB      Living        SAB      TAB      Ectopic      Multiple      Live Births              Family History  Problem Relation Age of Onset  . Asthma Mother   . Asthma Father     Social History   Tobacco Use  . Smoking status: Never Smoker  . Smokeless tobacco: Never Used  Substance Use Topics  . Alcohol use: No  . Drug use: No    Home Medications Prior to Admission medications   Medication Sig Start Date End Date Taking? Authorizing Provider  albuterol (VENTOLIN HFA) 108 (90 Base) MCG/ACT inhaler Inhale 2 puffs into the lungs every 6 (six) hours as needed for wheezing or shortness of breath. Patient not taking: Reported on 12/13/2019 04/14/19   Emily Filbert, MD  albuterol  (VENTOLIN HFA) 108 (90 Base) MCG/ACT inhaler Inhale 2 puffs into the lungs every 4 (four) hours as needed for wheezing or shortness of breath. 09/14/19   Cathren Laine, MD  Fluticasone-Salmeterol (ADVAIR DISKUS) 250-50 MCG/DOSE AEPB Inhale 1 puff into the lungs 2 (two) times daily. Patient not taking: Reported on 09/14/2019 04/14/19 04/13/20  Emily Filbert, MD  loratadine (CLARITIN) 10 MG tablet Take 1 tablet (10 mg total) by mouth daily. Patient not taking: Reported on 09/14/2019 12/11/18   Adrian Saran, MD  predniSONE (DELTASONE) 50 MG tablet Take 1 tablet (50 mg total) by mouth daily for 4 days. 02/16/20 02/20/20  Febe Champa, PA-C    Allergies    Iohexol, Eggs or egg-derived products, Montelukast sodium, and Penicillins  Review of Systems   Review of Systems  Respiratory: Positive for shortness of breath and wheezing.     Physical Exam Updated Vital Signs BP 136/73   Pulse 93   Temp 98.3 F (36.8 C) (Oral)   Resp 18   SpO2 97%   Physical Exam Vitals and nursing note  reviewed.  Constitutional:      General: She is not in acute distress.    Appearance: She is well-developed.     Comments: Sitting comfortably in the bed in no acute distress  HENT:     Head: Normocephalic and atraumatic.  Eyes:     Conjunctiva/sclera: Conjunctivae normal.     Pupils: Pupils are equal, round, and reactive to light.  Cardiovascular:     Rate and Rhythm: Normal rate and regular rhythm.     Pulses: Normal pulses.  Pulmonary:     Effort: Pulmonary effort is normal. No respiratory distress.     Breath sounds: Wheezing present.     Comments: Inspiratory and expiratory wheezing in all fields.  Speaking of full sentences.  Sats stable on room air Abdominal:     General: There is no distension.     Palpations: Abdomen is soft. There is no mass.     Tenderness: There is no abdominal tenderness. There is no guarding or rebound.  Musculoskeletal:        General: Normal range of motion.      Cervical back: Normal range of motion and neck supple.  Skin:    General: Skin is warm and dry.     Capillary Refill: Capillary refill takes less than 2 seconds.  Neurological:     Mental Status: She is alert and oriented to person, place, and time.     ED Results / Procedures / Treatments   Labs (all labs ordered are listed, but only abnormal results are displayed) Labs Reviewed - No data to display  EKG None  Radiology No results found.  Procedures Procedures (including critical care time)  Medications Ordered in ED Medications  albuterol (VENTOLIN HFA) 108 (90 Base) MCG/ACT inhaler 4 puff (4 puffs Inhalation Given 02/15/20 2016)  predniSONE (DELTASONE) tablet 60 mg (60 mg Oral Given 02/15/20 2016)    ED Course  I have reviewed the triage vital signs and the nursing notes.  Pertinent labs & imaging results that were available during my care of the patient were reviewed by me and considered in my medical decision making (see chart for details).    MDM Rules/Calculators/A&P                      Pt presenting for evaluation of shortness of breath and wheezing.  On exam, patient has inspiratory next Tory wheezing in all fields.  However her sats are stable and she does not appear in distress.  Will give prednisone p.o. and albuterol and reassess.  No fever or cough no chest pain to indicate signs of superimposed infection or new viral infection such as Covid.  On reassessment, patient reports symptoms are much improved.  Her wheezing is improved.  Discussed importance of follow-up with primary care for asthma maintenance, she will likely need to be on a preventative medicine.  At this time, patient appears safe for discharge.  Return precautions given.  Patient states she understands and agrees to plan.  Final Clinical Impression(s) / ED Diagnoses Final diagnoses:  Exacerbation of persistent asthma, unspecified asthma severity    Rx / DC Orders ED Discharge Orders          Ordered    predniSONE (DELTASONE) 50 MG tablet  Daily     02/15/20 2032           Franchot Heidelberg, PA-C 02/15/20 2206    Milton Ferguson, MD 02/16/20 1126

## 2020-02-15 NOTE — Discharge Instructions (Signed)
It is important that you establish with a primary care doctor so that your asthma can be managed. Call the clinic listed below.  Take the prednisone as prescribed.  Use your inhaler as needed. The more you use it, the less effective it is.  Return to the ER with any new, worsening, or concerning symptoms.

## 2020-02-19 ENCOUNTER — Ambulatory Visit: Payer: Medicaid Other

## 2020-02-26 ENCOUNTER — Ambulatory Visit: Payer: Medicaid Other

## 2020-03-02 ENCOUNTER — Emergency Department (HOSPITAL_COMMUNITY)
Admission: EM | Admit: 2020-03-02 | Discharge: 2020-03-02 | Disposition: A | Discharge status: Home / Self Care | Payer: Medicaid Other | Attending: Emergency Medicine | Admitting: Emergency Medicine

## 2020-03-02 ENCOUNTER — Emergency Department (HOSPITAL_COMMUNITY): Payer: Medicaid Other

## 2020-03-02 ENCOUNTER — Other Ambulatory Visit: Payer: Self-pay

## 2020-03-02 ENCOUNTER — Encounter (HOSPITAL_COMMUNITY): Payer: Self-pay | Admitting: Emergency Medicine

## 2020-03-02 DIAGNOSIS — J45901 Unspecified asthma with (acute) exacerbation: Secondary | ICD-10-CM | POA: Diagnosis not present

## 2020-03-02 DIAGNOSIS — R0602 Shortness of breath: Secondary | ICD-10-CM | POA: Diagnosis present

## 2020-03-02 MED ORDER — ALBUTEROL SULFATE HFA 108 (90 BASE) MCG/ACT IN AERS
4.0000 | INHALATION_SPRAY | Freq: Once | RESPIRATORY_TRACT | Status: AC
  Administered 2020-03-02: 6 via RESPIRATORY_TRACT
  Filled 2020-03-02: qty 6.7

## 2020-03-02 MED ORDER — PREDNISONE 20 MG PO TABS: 20.0000 mg | ORAL_TABLET | Freq: Every day | ORAL | 0 refills | Status: AC

## 2020-03-02 MED ORDER — PREDNISONE 20 MG PO TABS
60.0000 mg | ORAL_TABLET | Freq: Once | ORAL | Status: AC
  Administered 2020-03-02: 60 mg via ORAL
  Filled 2020-03-02: qty 3

## 2020-03-02 MED ORDER — IPRATROPIUM BROMIDE HFA 17 MCG/ACT IN AERS
2.0000 | INHALATION_SPRAY | Freq: Once | RESPIRATORY_TRACT | Status: AC
  Administered 2020-03-02: 2 via RESPIRATORY_TRACT
  Filled 2020-03-02: qty 12.9

## 2020-03-02 MED ORDER — ALBUTEROL SULFATE HFA 108 (90 BASE) MCG/ACT IN AERS: 1.0000 | INHALATION_SPRAY | Freq: Four times a day (QID) | RESPIRATORY_TRACT | 1 refills | Status: DC | PRN

## 2020-03-02 NOTE — ED Notes (Signed)
Pt ambulated without assistance, O2 reading 96%-98% RA

## 2020-03-02 NOTE — ED Notes (Signed)
An After Visit Summary was printed and given to the patient. Discharge instructions given and no further questions at this time.  

## 2020-03-02 NOTE — ED Provider Notes (Signed)
[facility]   CSN: [id number] Arrival date & time: 03/02/20  1659     History Chief Complaint  Patient presents with  . Asthma    Kathryn Nash is a 24 y.o. female with past medical history significant for asthma presents to emergency department today with chief complaint of wheezing and shortness of breath x2 days.  Patient states she has been out of her albuterol inhaler x 1 month. She is also endorsing intermittent nonproductive cough.  She denies fever, chills, chest pain, abdominal pain, back pain, lower extremity edema.  Denies any known covid exposure or loss of sense of taste and smell. Also denies history of admissions or intubation for asthma.  Last prescribed steroids x1 month ago. She does not have a pcp and been unable to establish care in the area.  History provided by patient with additional history obtained from chart review.    Past Medical History:  Diagnosis Date  . Asthma     Patient Active Problem List   Diagnosis Date Noted  . Morbid obesity (HCC) 210 lbs 10/20/2019  . Asthma exacerbation 12/08/2018    History reviewed. No pertinent surgical history.   OB History    Gravida  1   Para      Term      Preterm      AB      Living        SAB      TAB      Ectopic      Multiple      Live Births              Family History  Problem Relation Age of Onset  . Asthma Mother   . Asthma Father     Social History   Tobacco Use  . Smoking status: Never Smoker  . Smokeless tobacco: Never Used  Substance Use Topics  . Alcohol use: No  . Drug use: No    Home Medications Prior to Admission medications   Medication Sig Start Date End Date Taking? Authorizing Provider  albuterol (VENTOLIN HFA) 108 (90 Base) MCG/ACT inhaler Inhale 1-2 puffs into the lungs every 6 (six) hours as needed for wheezing or shortness of breath. 03/02/20   Kersti Scavone E, PA-C  Fluticasone-Salmeterol (ADVAIR  DISKUS) 250-50 MCG/DOSE AEPB Inhale 1 puff into the lungs 2 (two) times daily. Patient not taking: Reported on 09/14/2019 04/14/19 04/13/20  Emily Filbert, MD  loratadine (CLARITIN) 10 MG tablet Take 1 tablet (10 mg total) by mouth daily. Patient not taking: Reported on 09/14/2019 12/11/18   Adrian Saran, MD  predniSONE (DELTASONE) 20 MG tablet Take 1 tablet (20 mg total) by mouth daily for 4 days. 03/03/20 03/07/20  Pearline Yerby E, PA-C    Allergies    Iohexol, Eggs or egg-derived products, Montelukast sodium, and Penicillins  Review of Systems   Review of Systems  All other systems are reviewed and are negative for acute change except as noted in the HPI.   Physical Exam Updated Vital Signs BP 136/78 (BP Location: Right Arm)   Pulse 98   Temp 98.9 F (37.2 C) (Oral)   Resp 19   SpO2 96%   Physical Exam Vitals and nursing note reviewed.  Constitutional:      General: She is not in acute distress.    Appearance: She is not ill-appearing.  HENT:     Head: Normocephalic and atraumatic.     Right  Ear: Tympanic membrane and external ear normal.     Left Ear: Tympanic membrane and external ear normal.     Nose: Nose normal.     Mouth/Throat:     Mouth: Mucous membranes are moist.     Pharynx: Oropharynx is clear.  Eyes:     General: No scleral icterus.       Right eye: No discharge.        Left eye: No discharge.     Extraocular Movements: Extraocular movements intact.     Conjunctiva/sclera: Conjunctivae normal.     Pupils: Pupils are equal, round, and reactive to light.  Neck:     Vascular: No JVD.  Cardiovascular:     Rate and Rhythm: Normal rate and regular rhythm.     Pulses: Normal pulses.          Radial pulses are 2+ on the right side and 2+ on the left side.     Heart sounds: Normal heart sounds.  Pulmonary:     Breath sounds: Wheezing present.     Comments: Inspiratory wheeze heard.  Symmetric chest rise. No rales, or rhonchi.  Normal work of breathing.   Oxygen saturation is 96% on room air. Abdominal:     Comments: Abdomen is soft, non-distended, and non-tender in all quadrants. No rigidity, no guarding. No peritoneal signs.  Musculoskeletal:        General: Normal range of motion.     Cervical back: Normal range of motion.  Skin:    General: Skin is warm and dry.     Capillary Refill: Capillary refill takes less than 2 seconds.  Neurological:     Mental Status: She is oriented to person, place, and time.     GCS: GCS eye subscore is 4. GCS verbal subscore is 5. GCS motor subscore is 6.     Comments: Fluent speech, no facial droop.  Psychiatric:        Behavior: Behavior normal.     ED Results / Procedures / Treatments   Labs (all labs ordered are listed, but only abnormal results are displayed) Labs Reviewed - No data to display  EKG None  Radiology DG Chest Portable 1 View  Result Date: 03/02/2020 CLINICAL DATA:  Cough and asthma EXAM: PORTABLE CHEST 1 VIEW COMPARISON:  09/14/2019 FINDINGS: The heart size and mediastinal contours are within normal limits. Both lungs are clear. The visualized skeletal structures are unremarkable. IMPRESSION: No active disease. Electronically Signed   By: Jasmine Pang M.D.   On: 03/02/2020 18:14    Procedures Procedures (including critical care time)  Medications Ordered in ED Medications  predniSONE (DELTASONE) tablet 60 mg (60 mg Oral Given 03/02/20 1759)  albuterol (VENTOLIN HFA) 108 (90 Base) MCG/ACT inhaler 4-6 puff (6 puffs Inhalation Given 03/02/20 1759)  ipratropium (ATROVENT HFA) inhaler 2 puff (2 puffs Inhalation Given 03/02/20 1800)    ED Course  I have reviewed the triage vital signs and the nursing notes.  Pertinent labs & imaging results that were available during my care of the patient were reviewed by me and considered in my medical decision making (see chart for details).   Clinical Course as of Mar 02 1902  Wed Mar 02, 2020  1802 Patient ambulated by RN without  respiratory distress, tachycardia, or hypoxia. SpO2 during ambulation >94% on room air.     [KA]    Clinical Course User Index [KA] Ares Cardozo, Caroleen Hamman, PA-C   MDM Rules/Calculators/A&P  Patient seen and examined.  She is well-appearing, no acute distress.  On my exam she does have inspiratory wheeze heard in all fields.  She has normal work of breathing.    I viewed pt's chest xray and it does not suggest acute infectious processes Lung exam improved after albuterol and ipatropium inhaler use. Prednisone given in the ED and pt will be discharged with 4 day burst.  Patient ambulated in ED with O2 saturations maintained >90, no current signs of respiratory distress.She declines chance of pregnancy and politely declines pregnancy test today. Pt states they are breathing at baseline. Pt has been instructed to continue using prescribed medications and to establish care with PCP.  Patient given information for [place] [facility].  The patient appears reasonably screened and/or stabilized for discharge and I doubt any other medical condition or other [facility] requiring further screening, evaluation, or treatment in the ED at this time prior to discharge. The patient is safe for discharge with strict return precautions discussed.    Portions of this note were generated with Lobbyist. Dictation errors may occur despite best attempts at proofreading.  Final Clinical Impression(s) / ED Diagnoses Final diagnoses:  Exacerbation of persistent asthma, unspecified asthma severity    Rx / DC Orders ED Discharge Orders         Ordered    predniSONE (DELTASONE) 20 MG tablet  Daily     03/02/20 1851    albuterol (VENTOLIN HFA) 108 (90 Base) MCG/ACT inhaler  Every 6 hours PRN     03/02/20 1855           Flint Melter 03/02/20 1904    Margette Fast, MD 03/02/20 2313

## 2020-03-02 NOTE — Discharge Instructions (Addendum)
You have been seen today for asthma exacerbation. Please read and follow all provided instructions. Return to the emergency room for worsening condition or new concerning symptoms.    1. Medications:  Prescription sent to your pharmacy for prednisone.  This is used to treat asthma exacerbations.  Please take as prescribed. -Prescription for albuterol inhaler sent to your pharmacy. Please use as directed (1-2 puff every 6 hours). -Use the Ipratropium inhaler: 2 to 3 oral inhalations every 6 hours for asthma exacerbathion only. This is not used a daily inhaler   Continue usual home medications Take medications as prescribed. Please review all of the medicines and only take them if you do not have an allergy to them.   2. Treatment: rest, drink plenty of fluids  3. Follow Up:  Please follow up with primary care provider by scheduling an appointment as soon as possible for a visit  -I have included information for [place] [facility] [facility] for you to try to follow-up with as we discussed.   It is also a possibility that you have an allergic reaction to any of the medicines that you have been prescribed - Everybody reacts differently to medications and while MOST people have no trouble with most medicines, you may have a reaction such as nausea, vomiting, rash, swelling, shortness of breath. If this is the case, please stop taking the medicine immediately and contact your physician.  ?

## 2020-03-02 NOTE — ED Triage Notes (Signed)
Patient here from home with complaints of asthma exacerbation. Reports that she is out of her inhaler and would like a refill.

## 2020-03-25 ENCOUNTER — Ambulatory Visit: Payer: Medicaid Other

## 2020-04-03 ENCOUNTER — Other Ambulatory Visit: Payer: Self-pay

## 2020-04-03 ENCOUNTER — Emergency Department (HOSPITAL_COMMUNITY)
Admission: EM | Admit: 2020-04-03 | Discharge: 2020-04-03 | Disposition: A | Discharge status: Home / Self Care | Payer: Medicaid Other | Attending: Emergency Medicine | Admitting: Emergency Medicine

## 2020-04-03 ENCOUNTER — Emergency Department (HOSPITAL_COMMUNITY): Payer: Medicaid Other

## 2020-04-03 DIAGNOSIS — R062 Wheezing: Secondary | ICD-10-CM | POA: Diagnosis present

## 2020-04-03 DIAGNOSIS — J45901 Unspecified asthma with (acute) exacerbation: Secondary | ICD-10-CM | POA: Diagnosis not present

## 2020-04-03 LAB — POC URINE PREG, ED: Preg Test, Ur: NEGATIVE

## 2020-04-03 MED ORDER — ALBUTEROL SULFATE (2.5 MG/3ML) 0.083% IN NEBU
2.5000 mg | INHALATION_SOLUTION | Freq: Once | RESPIRATORY_TRACT | Status: AC
  Administered 2020-04-03: 2.5 mg via RESPIRATORY_TRACT

## 2020-04-03 MED ORDER — PREDNISONE 20 MG PO TABS: 20.0000 mg | ORAL_TABLET | Freq: Every day | ORAL | 0 refills | Status: AC

## 2020-04-03 MED ORDER — ALBUTEROL SULFATE HFA 108 (90 BASE) MCG/ACT IN AERS
INHALATION_SPRAY | RESPIRATORY_TRACT | Status: AC
  Filled 2020-04-03: qty 6.7

## 2020-04-03 NOTE — ED Provider Notes (Signed)
[facility]-EMERGENCY DEPT Provider Note   CSN: [id number] Arrival date & time: 04/03/20  0901     History Chief Complaint  Patient presents with  . Asthma    Kathryn Nash is a 24 y.o. female with past medical history significant for asthma presents to emergency department today with chief complaint of asthma flare x1 day.  She states when she woke up this morning she had wheezing which she did not have yesterday.  She used her inhaler at home that  without any symptom relief.  She states her asthma has been well controlled over the last 2 months.  She has been using her inhalers daily.  She denies any associated chest pain or cough.  Also denies fever, chills, nasal congestion, loss of sense of taste or smell, abdominal pain, nausea, vomiting, urinary symptoms, diarrhea.  Denies any sick contacts.  She was last prescribed PO steroids x 2 months ago.    Past Medical History:  Diagnosis Date  . Asthma     Patient Active Problem List   Diagnosis Date Noted  . Morbid obesity (HCC) 210 lbs 10/20/2019  . Asthma exacerbation 12/08/2018    No past surgical history on file.   OB History    Gravida  1   Para      Term      Preterm      AB      Living        SAB      TAB      Ectopic      Multiple      Live Births              Family History  Problem Relation Age of Onset  . Asthma Mother   . Asthma Father     Social History   Tobacco Use  . Smoking status: Never Smoker  . Smokeless tobacco: Never Used  Substance Use Topics  . Alcohol use: No  . Drug use: No    Home Medications Prior to Admission medications   Medication Sig Start Date End Date Taking? Authorizing Provider  albuterol (VENTOLIN HFA) 108 (90 Base) MCG/ACT inhaler Inhale 1-2 puffs into the lungs every 6 (six) hours as needed for wheezing or shortness of breath. 03/02/20   Eva Vallee E, PA-C  Fluticasone-Salmeterol (ADVAIR DISKUS) 250-50 MCG/DOSE AEPB Inhale  1 puff into the lungs 2 (two) times daily. Patient not taking: Reported on 09/14/2019 04/14/19 04/13/20  Emily Filbert, MD  loratadine (CLARITIN) 10 MG tablet Take 1 tablet (10 mg total) by mouth daily. Patient not taking: Reported on 09/14/2019 12/11/18   Adrian Saran, MD  predniSONE (DELTASONE) 20 MG tablet Take 1 tablet (20 mg total) by mouth daily for 5 days. 04/03/20 04/08/20  Blessen Kimbrough E, PA-C    Allergies    Iohexol, Eggs or egg-derived products, Montelukast sodium, and Penicillins  Review of Systems   Review of Systems  All other systems are reviewed and are negative for acute change except as noted in the HPI.   Physical Exam Updated Vital Signs BP (!) 111/94 (BP Location: Right Arm)   Pulse 97   Temp (!) 97.5 F (36.4 C) (Oral)   Resp 18   SpO2 100%   Physical Exam Vitals and nursing note reviewed.  Constitutional:      General: She is not in acute distress.    Appearance: She is not ill-appearing.  HENT:     Head: Normocephalic and  atraumatic.     Right Ear: Tympanic membrane and external ear normal.     Left Ear: Tympanic membrane and external ear normal.     Nose: Nose normal.     Mouth/Throat:     Mouth: Mucous membranes are moist.     Pharynx: Oropharynx is clear.  Eyes:     General: No scleral icterus.       Right eye: No discharge.        Left eye: No discharge.     Extraocular Movements: Extraocular movements intact.     Conjunctiva/sclera: Conjunctivae normal.     Pupils: Pupils are equal, round, and reactive to light.  Neck:     Vascular: No JVD.  Cardiovascular:     Rate and Rhythm: Normal rate and regular rhythm.     Pulses: Normal pulses.          Radial pulses are 2+ on the right side and 2+ on the left side.     Heart sounds: Normal heart sounds.  Pulmonary:     Comments: Normal work of breathing.  Oxygen saturation is 100% on room air.  Symmetric chest rise.  Faint expiratory wheeze heard in bilateral upper lung fields.  She is  speaking in full sentences.  No nasal flaring. Abdominal:     Comments: Abdomen is soft, non-distended, and non-tender in all quadrants. No rigidity, no guarding. No peritoneal signs.  Musculoskeletal:        General: Normal range of motion.     Cervical back: Normal range of motion.     Right lower leg: No edema.     Left lower leg: No edema.  Skin:    General: Skin is warm and dry.     Capillary Refill: Capillary refill takes less than 2 seconds.  Neurological:     Mental Status: She is oriented to person, place, and time.     GCS: GCS eye subscore is 4. GCS verbal subscore is 5. GCS motor subscore is 6.     Comments: Fluent speech, no facial droop.  Psychiatric:        Behavior: Behavior normal.     ED Results / Procedures / Treatments   Labs (all labs ordered are listed, but only abnormal results are displayed) Labs Reviewed  POC URINE PREG, ED    EKG None  Radiology No results found.  Procedures Procedures (including critical care time)  Medications Ordered in ED Medications  albuterol (PROVENTIL) (2.5 MG/3ML) 0.083% nebulizer solution 2.5 mg (2.5 mg Nebulization Given 04/03/20 0917)    ED Course  I have reviewed the triage vital signs and the nursing notes.  Pertinent labs & imaging results that were available during my care of the patient were reviewed by me and considered in my medical decision making (see chart for details).    MDM Rules/Calculators/A&P                      History provided by patient with additional history obtained from chart review.     Upon entering the room patient tells me she has a family emergency and needs to leave.  She is agreeable to an exam first.  She has faint wheezing heard in bilateral lung bases.  Oxygen saturation is 100% on room air at rest.  I ambulated patient in the room and she did so without any difficulty breathing, tachypnea or hypoxia.  Oxygen saturation stayed at 97%.  Patient received albuterol inhaler in triage  and reports feeling much better now.  She is not willing to stay for the chest x-ray or EKG. I attempted to encourage her to stay about the testing performed but she is adamant about leaving.  She denies any infectious symptoms, feel like this is unlikely to be bacterial pneumonia, Covid or superimposed infection but did discuss signs symptoms to watch for. Pregnancy test collected and was negative.  Patient has recently established care with a primary care doctor in [place] next weekand has an appointment scheduled for next week.  Strongly recommend she keep that as scheduled.  The patient appears reasonably screened and/or stabilized for discharge and I doubt any other medical condition or other [facility] requiring further screening, evaluation, or treatment in the ED at this time prior to discharge. The patient is safe for discharge with strict return precautions discussed.    Portions of this note were generated with Scientist, clinical (histocompatibility and immunogenetics). Dictation errors may occur despite best attempts at proofreading.   Final Clinical Impression(s) / ED Diagnoses Final diagnoses:  Exacerbation of persistent asthma, unspecified asthma severity    Rx / DC Orders ED Discharge Orders         Ordered    predniSONE (DELTASONE) 20 MG tablet  Daily     04/03/20 1004           Sherene Sires, [place] 04/03/20 1051    Terald Sleeper, MD 04/03/20 1754

## 2020-04-03 NOTE — ED Triage Notes (Signed)
Pt has asthma and reports flare up this morning. Doesn't have an inhaler at home.

## 2020-04-03 NOTE — Discharge Instructions (Addendum)
Followup with your doctor in 2-5 days in regards to your asthma exacerbation.   1. Medications: albuterol, prednisone, usual home medications Use albuterol either 2 puffs with your inhaler or via a neb machine every 4 hr scheduled for 24hr then every 4 hr as needed. Take the steroid prednisone as prescribed once daily for 5 days.  2. Treatment: rest, drink plenty of fluids, begin OTC antihistamine (Zyrtec or Claritin)    -It is very important that you establish care with a primary care doctor.  Please keep your appointment scheduled for next week.  Return to the emergency department if you become short of breath, your albuterol inhaler did not relieve symptoms, you are having chest pain associated with difficulty breathing, or any symptoms that are concerning to you.

## 2020-06-02 DIAGNOSIS — Z419 Encounter for procedure for purposes other than remedying health state, unspecified: Secondary | ICD-10-CM | POA: Diagnosis not present

## 2020-07-03 DIAGNOSIS — Z419 Encounter for procedure for purposes other than remedying health state, unspecified: Secondary | ICD-10-CM | POA: Diagnosis not present

## 2020-07-08 ENCOUNTER — Other Ambulatory Visit: Payer: Self-pay

## 2020-07-08 ENCOUNTER — Emergency Department (HOSPITAL_COMMUNITY): Payer: Medicaid Other

## 2020-07-08 DIAGNOSIS — M791 Myalgia, unspecified site: Secondary | ICD-10-CM | POA: Insufficient documentation

## 2020-07-08 DIAGNOSIS — Z5321 Procedure and treatment not carried out due to patient leaving prior to being seen by health care provider: Secondary | ICD-10-CM | POA: Insufficient documentation

## 2020-07-08 DIAGNOSIS — J9811 Atelectasis: Secondary | ICD-10-CM | POA: Diagnosis not present

## 2020-07-08 DIAGNOSIS — R509 Fever, unspecified: Secondary | ICD-10-CM | POA: Diagnosis not present

## 2020-07-08 DIAGNOSIS — R05 Cough: Secondary | ICD-10-CM | POA: Diagnosis not present

## 2020-07-08 NOTE — ED Triage Notes (Signed)
Arrived POV from home. 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 Patient reports feer and body aches.

## 2020-07-09 ENCOUNTER — Emergency Department (HOSPITAL_COMMUNITY)
Admission: EM | Admit: 2020-07-09 | Discharge: 2020-07-09 | Disposition: A | Discharge status: Home / Self Care | Payer: Medicaid Other | Attending: Emergency Medicine | Admitting: Emergency Medicine

## 2020-07-09 NOTE — ED Notes (Signed)
Pt eloped from waiting area. Called 3X.  

## 2020-08-03 DIAGNOSIS — Z419 Encounter for procedure for purposes other than remedying health state, unspecified: Secondary | ICD-10-CM | POA: Diagnosis not present

## 2020-09-02 DIAGNOSIS — Z419 Encounter for procedure for purposes other than remedying health state, unspecified: Secondary | ICD-10-CM | POA: Diagnosis not present

## 2020-09-12 ENCOUNTER — Emergency Department (HOSPITAL_COMMUNITY)
Admission: EM | Admit: 2020-09-12 | Discharge: 2020-09-12 | Disposition: A | Discharge status: Home / Self Care | Payer: Medicaid Other | Attending: Emergency Medicine | Admitting: Emergency Medicine

## 2020-09-12 ENCOUNTER — Other Ambulatory Visit: Payer: Self-pay

## 2020-09-12 ENCOUNTER — Encounter (HOSPITAL_COMMUNITY): Payer: Self-pay

## 2020-09-12 DIAGNOSIS — R319 Hematuria, unspecified: Secondary | ICD-10-CM | POA: Insufficient documentation

## 2020-09-12 DIAGNOSIS — J45901 Unspecified asthma with (acute) exacerbation: Secondary | ICD-10-CM | POA: Diagnosis not present

## 2020-09-12 DIAGNOSIS — R1030 Lower abdominal pain, unspecified: Secondary | ICD-10-CM | POA: Diagnosis not present

## 2020-09-12 DIAGNOSIS — R102 Pelvic and perineal pain: Secondary | ICD-10-CM

## 2020-09-12 LAB — URINALYSIS, ROUTINE W REFLEX MICROSCOPIC
Bacteria, UA: NONE SEEN
Bilirubin Urine: NEGATIVE
Glucose, UA: NEGATIVE mg/dL
Ketones, ur: NEGATIVE mg/dL
Nitrite: NEGATIVE
Protein, ur: NEGATIVE mg/dL
Specific Gravity, Urine: 1.027 (ref 1.005–1.030)
WBC, UA: >50 WBC/hpf — ABNORMAL HIGH (ref 0–5)
pH: 5 (ref 5.0–8.0)

## 2020-09-12 LAB — PREGNANCY, URINE: Preg Test, Ur: NEGATIVE

## 2020-09-12 MED ORDER — DICYCLOMINE HCL 20 MG PO TABS: 20.0000 mg | ORAL_TABLET | Freq: Two times a day (BID) | ORAL | 0 refills | Status: DC

## 2020-09-12 MED ORDER — NITROFURANTOIN MONOHYD MACRO 100 MG PO CAPS: 100.0000 mg | ORAL_CAPSULE | Freq: Two times a day (BID) | ORAL | 0 refills | Status: DC

## 2020-09-12 NOTE — ED Provider Notes (Signed)
[facility]   CSN: [id number] Arrival date & time: 09/12/20  1521     History Chief Complaint  Patient presents with  . Possible UTI    Kathryn Nash is a 24 y.o. female.  HPI Patient is a 24 year old female with no pertinent past medical history besides asthma which is well controlled presented today for 3 days of some crampy suprapubic pain.  She denies any new or unusual vaginal discharge, dyspareunia, vaginal bleeding or spotting.  She states that she has no urinary symptoms apart from some suprapubic discomfort she states that she has only had 1 urinary tract infections the past however presented somewhat similarly.  She states that she has a primary care doctor and was planning on following up with her but wanted to come to ER for antibiotics.  She denies any fevers, chills, flank pain.  No history of urinary stones.  No associated symptoms as described above.  No aggravating or mitigating factors.     Past Medical History:  Diagnosis Date  . Asthma     Patient Active Problem List   Diagnosis Date Noted  . Morbid obesity (HCC) 210 lbs 10/20/2019  . Asthma exacerbation 12/08/2018    History reviewed. No pertinent surgical history.   OB History    Gravida  1   Para      Term      Preterm      AB      Living        SAB      TAB      Ectopic      Multiple      Live Births              Family History  Problem Relation Age of Onset  . Asthma Mother   . Asthma Father     Social History   Tobacco Use  . Smoking status: Never Smoker  . Smokeless tobacco: Never Used  Vaping Use  . Vaping Use: Never used  Substance Use Topics  . Alcohol use: No  . Drug use: No    Home Medications Prior to Admission medications   Medication Sig Start Date End Date Taking? Authorizing Provider  albuterol (VENTOLIN HFA) 108 (90 Base) MCG/ACT inhaler Inhale 1-2 puffs into the lungs every 6 (six) hours as  needed for wheezing or shortness of breath. 03/02/20   Albrizze, Kaitlyn E, PA-C  dicyclomine (BENTYL) 20 MG tablet Take 1 tablet (20 mg total) by mouth 2 (two) times daily. 09/12/20   Gailen Shelter, PA  Fluticasone-Salmeterol (ADVAIR DISKUS) 250-50 MCG/DOSE AEPB Inhale 1 puff into the lungs 2 (two) times daily. Patient not taking: Reported on 09/14/2019 04/14/19 04/13/20  Emily Filbert, MD  loratadine (CLARITIN) 10 MG tablet Take 1 tablet (10 mg total) by mouth daily. Patient not taking: Reported on 09/14/2019 12/11/18   Adrian Saran, MD  nitrofurantoin, macrocrystal-monohydrate, (MACROBID) 100 MG capsule Take 1 capsule (100 mg total) by mouth 2 (two) times daily. 09/12/20   Gailen Shelter, PA    Allergies    Iohexol, Eggs or egg-derived products, Montelukast sodium, and Penicillins  Review of Systems   Review of Systems  Constitutional: Negative for chills and fever.  HENT: Negative for congestion.   Eyes: Negative for pain.  Respiratory: Negative for cough and shortness of breath.   Cardiovascular: Negative for chest pain and leg swelling.  Gastrointestinal: Positive for abdominal pain. Negative for abdominal distention and  vomiting.  Genitourinary: Negative for dysuria.  Musculoskeletal: Negative for myalgias.  Skin: Negative for rash.  Neurological: Negative for dizziness and headaches.    Physical Exam Updated Vital Signs BP 131/76 (BP Location: Right Arm)   Pulse 75   Temp 98.2 F (36.8 C) (Oral)   Resp 16   Ht 5\' 6"  (1.676 m)   Wt 95.3 kg   LMP 08/12/2020   SpO2 100%   BMI 33.89 kg/m   Physical Exam Vitals and nursing note reviewed.  Constitutional:      General: She is not in acute distress. HENT:     Head: Normocephalic and atraumatic.     Nose: Nose normal.  Eyes:     General: No scleral icterus. Cardiovascular:     Rate and Rhythm: Normal rate and regular rhythm.     Pulses: Normal pulses.     Heart sounds: Normal heart sounds.  Pulmonary:      Effort: Pulmonary effort is normal. No respiratory distress.     Breath sounds: No wheezing.  Abdominal:     Palpations: Abdomen is soft.     Tenderness: There is no abdominal tenderness. There is no right CVA tenderness, left CVA tenderness, guarding or rebound.     Comments: Mildly obese abdomen.  No tenderness to palpation.  No rashes or bruising.  No guarding or tenderness.  No CVA tenderness.  Musculoskeletal:     Cervical back: Normal range of motion.     Right lower leg: No edema.     Left lower leg: No edema.  Skin:    General: Skin is warm and dry.     Capillary Refill: Capillary refill takes less than 2 seconds.  Neurological:     Mental Status: She is alert. Mental status is at baseline.  Psychiatric:        Mood and Affect: Mood normal.        Behavior: Behavior normal.     ED Results / Procedures / Treatments   Labs (all labs ordered are listed, but only abnormal results are displayed) Labs Reviewed  URINALYSIS, ROUTINE W REFLEX MICROSCOPIC - Abnormal; Notable for the following components:      Result Value   APPearance HAZY (*)    Hgb urine dipstick SMALL (*)    Leukocytes,Ua LARGE (*)    WBC, UA >50 (*)    All other components within normal limits  PREGNANCY, URINE    EKG None  Radiology No results found.  Procedures Procedures (including critical care time)  Medications Ordered in ED Medications - No data to display  ED Course  I have reviewed the triage vital signs and the nursing notes.  Pertinent labs & imaging results that were available during my care of the patient were reviewed by me and considered in my medical decision making (see chart for details).    MDM Rules/Calculators/A&P                          Patient is well-appearing 24 year old female no acute distress sitting comfortably in bed.  Has no abdominal tenderness.  Does have some suprapubic pain which she states is very mild.  Low suspicion for urinary tract infection given her  indeterminate urinary analysis.  Urine pregnancy is negative. Shared decision-making conversation with patient she states that she would prefer to be empirically treated instead of following up with PCP.  That she has some hematuria and denies any vaginal bleeding or spotting  it is reasonable to cover with Macrobid.   She has no abdominal tenderness on exam to indicate any intra-abdominal surgical emergency.  This patient presents with abdominal pain of unclear etiology. Their evaluation has not identified a emergent etiology for the abdominal pain. Specifically, given the very benign exam and lack of significant risk factors, I have a very low suspicion for appendicitis, ischemic bowel, bowel perforation, or any other life threatening disease. I have discussed with the patient the level of uncertainty with undifferentiated abdominal pain and clearly explained the need to follow-up as noted on the discharge instructions, or return to the Emergency Department immediately if the pain worsens, develops fever, persistent and uncontrollable vomiting, or for any new symptoms or concerns. I discussed with the patient that this presentation today for abdominal pain could represent a significant risk for an acute abdominal process. Although the tests in the ED were essentially normal, there is still a possibility of a process such as appendicitis, diverticulitis, cholecystitis, ulcer, early bowel obstruction, mesenteric ischemia, kidney stone, or even kidney infection which could subsequently cause disability or death. The patient understands that they must return within 24 hours for a recheck or see their physician within 24 hours for re-exam due to the possibility of significant surgical or medical process.  Patient understands need for close follow-up and her return precautions.  Vital signs within normal notes.  Tolerating p.o.  Aware of hematuria and need for follow-up on this.  Discharged with Bentyl and  nitrofurantoin.  Final Clinical Impression(s) / ED Diagnoses Final diagnoses:  Suprapubic abdominal pain  Hematuria, unspecified type    Rx / DC Orders ED Discharge Orders         Ordered    dicyclomine (BENTYL) 20 MG tablet  2 times daily        09/12/20 1752    nitrofurantoin, macrocrystal-monohydrate, (MACROBID) 100 MG capsule  2 times daily        09/12/20 1752           Gailen Shelter, [place] 09/12/20 Juleen China, MD 09/13/20 0003

## 2020-09-12 NOTE — ED Triage Notes (Signed)
Patient reports mild lower abdominal pain X3 days and intermittent.   0/10 pain.   Pt reports cloudy urine   A/ox4 Ambulatory in triage

## 2020-09-12 NOTE — Discharge Instructions (Addendum)
Your pregnancy test was negative and your urinalysis was indeterminate I have very low suspicion overall for urinary tract infection however since you would prefer to be tested empirically I will provide you with Macrobid.  Please use as prescribed twice daily for the next 5 days.  Please drink plenty of water.  I have also prescribed you Bentyl as this pain may be related to your abdominal pain/cramping discomfort.  Please follow-up with your primary care doctor as we discussed.  Please return to emergency department for any new or concerning symptoms.  Your abdominal exam today was reassuring however if your symptoms change this would warrant reevaluation.

## 2020-09-12 NOTE — ED Notes (Signed)
An After Visit Summary was printed and given to the patient. Discharge instructions given and no further questions at this time.  

## 2020-10-03 DIAGNOSIS — Z419 Encounter for procedure for purposes other than remedying health state, unspecified: Secondary | ICD-10-CM | POA: Diagnosis not present

## 2020-10-14 ENCOUNTER — Other Ambulatory Visit: Payer: Self-pay

## 2020-10-14 ENCOUNTER — Emergency Department (HOSPITAL_COMMUNITY): Payer: Medicaid Other

## 2020-10-14 ENCOUNTER — Inpatient Hospital Stay (HOSPITAL_COMMUNITY)
Admission: AD | Admit: 2020-10-14 | Discharge: 2020-10-14 | Disposition: A | Discharge status: Home / Self Care | Payer: Medicaid Other | Attending: Obstetrics and Gynecology | Admitting: Obstetrics and Gynecology

## 2020-10-14 ENCOUNTER — Encounter (HOSPITAL_COMMUNITY): Payer: Self-pay | Admitting: Emergency Medicine

## 2020-10-14 DIAGNOSIS — O00102 Left tubal pregnancy without intrauterine pregnancy: Secondary | ICD-10-CM | POA: Diagnosis not present

## 2020-10-14 DIAGNOSIS — Z20822 Contact with and (suspected) exposure to covid-19: Secondary | ICD-10-CM | POA: Diagnosis not present

## 2020-10-14 DIAGNOSIS — O00202 Left ovarian pregnancy without intrauterine pregnancy: Secondary | ICD-10-CM | POA: Insufficient documentation

## 2020-10-14 DIAGNOSIS — Z880 Allergy status to penicillin: Secondary | ICD-10-CM | POA: Insufficient documentation

## 2020-10-14 DIAGNOSIS — Z3A01 Less than 8 weeks gestation of pregnancy: Secondary | ICD-10-CM | POA: Diagnosis not present

## 2020-10-14 DIAGNOSIS — Z3A00 Weeks of gestation of pregnancy not specified: Secondary | ICD-10-CM | POA: Diagnosis not present

## 2020-10-14 DIAGNOSIS — O26899 Other specified pregnancy related conditions, unspecified trimester: Secondary | ICD-10-CM | POA: Diagnosis not present

## 2020-10-14 DIAGNOSIS — N854 Malposition of uterus: Secondary | ICD-10-CM | POA: Diagnosis not present

## 2020-10-14 DIAGNOSIS — N838 Other noninflammatory disorders of ovary, fallopian tube and broad ligament: Secondary | ICD-10-CM | POA: Diagnosis not present

## 2020-10-14 LAB — URINALYSIS, ROUTINE W REFLEX MICROSCOPIC
Bilirubin Urine: NEGATIVE
Glucose, UA: NEGATIVE mg/dL
Hgb urine dipstick: NEGATIVE
Ketones, ur: NEGATIVE mg/dL
Nitrite: NEGATIVE
Protein, ur: NEGATIVE mg/dL
Specific Gravity, Urine: 1.031 — ABNORMAL HIGH (ref 1.005–1.030)
WBC, UA: >50 WBC/hpf — ABNORMAL HIGH (ref 0–5)
pH: 5 (ref 5.0–8.0)

## 2020-10-14 LAB — HEPATIC FUNCTION PANEL
ALT: 17 U/L (ref 0–44)
AST: 15 U/L (ref 15–41)
Albumin: 4.1 g/dL (ref 3.5–5.0)
Alkaline Phosphatase: 57 U/L (ref 38–126)
Bilirubin, Direct: 0.1 mg/dL (ref 0.0–0.2)
Indirect Bilirubin: 0.7 mg/dL (ref 0.3–0.9)
Total Bilirubin: 0.8 mg/dL (ref 0.3–1.2)
Total Protein: 8.7 g/dL — ABNORMAL HIGH (ref 6.5–8.1)

## 2020-10-14 LAB — WET PREP, GENITAL
Sperm: NONE SEEN
Trich, Wet Prep: NONE SEEN
Yeast Wet Prep HPF POC: NONE SEEN

## 2020-10-14 LAB — CBC WITH DIFFERENTIAL/PLATELET
Abs Immature Granulocytes: 0.05 10*3/uL (ref 0.00–0.07)
Basophils Absolute: 0.1 10*3/uL (ref 0.0–0.1)
Basophils Relative: 1 %
Eosinophils Absolute: 0.6 10*3/uL — ABNORMAL HIGH (ref 0.0–0.5)
Eosinophils Relative: 4 %
HCT: 37.4 % (ref 36.0–46.0)
Hemoglobin: 12.8 g/dL (ref 12.0–15.0)
Immature Granulocytes: 0 %
Lymphocytes Relative: 22 %
Lymphs Abs: 3 10*3/uL (ref 0.7–4.0)
MCH: 30.8 pg (ref 26.0–34.0)
MCHC: 34.2 g/dL (ref 30.0–36.0)
MCV: 89.9 fL (ref 80.0–100.0)
Monocytes Absolute: 1.1 10*3/uL — ABNORMAL HIGH (ref 0.1–1.0)
Monocytes Relative: 8 %
Neutro Abs: 8.9 10*3/uL — ABNORMAL HIGH (ref 1.7–7.7)
Neutrophils Relative %: 65 %
Platelets: 448 10*3/uL — ABNORMAL HIGH (ref 150–400)
RBC: 4.16 MIL/uL (ref 3.87–5.11)
RDW: 12.8 % (ref 11.5–15.5)
WBC: 13.6 10*3/uL — ABNORMAL HIGH (ref 4.0–10.5)
nRBC: 0 % (ref 0.0–0.2)

## 2020-10-14 LAB — BASIC METABOLIC PANEL
Anion gap: 9 (ref 5–15)
BUN: 10 mg/dL (ref 6–20)
CO2: 24 mmol/L (ref 22–32)
Calcium: 9 mg/dL (ref 8.9–10.3)
Chloride: 102 mmol/L (ref 98–111)
Creatinine, Ser: 0.71 mg/dL (ref 0.44–1.00)
GFR, Estimated: >60 mL/min (ref >60)
Glucose, Bld: 98 mg/dL (ref 70–99)
Potassium: 3.6 mmol/L (ref 3.5–5.1)
Sodium: 135 mmol/L (ref 135–145)

## 2020-10-14 LAB — RESPIRATORY PANEL BY RT PCR (FLU A&B, COVID)
Influenza A by PCR: NEGATIVE
Influenza B by PCR: NEGATIVE
SARS Coronavirus 2 by RT PCR: NEGATIVE

## 2020-10-14 LAB — TYPE AND SCREEN
ABO/RH(D): O POS
Antibody Screen: NEGATIVE

## 2020-10-14 LAB — HIV ANTIBODY (ROUTINE TESTING W REFLEX): HIV Screen 4th Generation wRfx: NONREACTIVE

## 2020-10-14 LAB — HCG, QUANTITATIVE, PREGNANCY: hCG, Beta Chain, Quant, S: 165 m[IU]/mL — ABNORMAL HIGH (ref <5)

## 2020-10-14 LAB — POC URINE PREG, ED: Preg Test, Ur: POSITIVE — AB

## 2020-10-14 MED ORDER — METHOTREXATE FOR ECTOPIC PREGNANCY
50.0000 mg/m2 | Freq: Once | INTRAMUSCULAR | Status: AC
  Administered 2020-10-14: 105 mg via INTRAMUSCULAR
  Filled 2020-10-14: qty 1

## 2020-10-14 NOTE — ED Notes (Signed)
Report called to MAU. Given to [place]. [facility] here for transport.

## 2020-10-14 NOTE — MAU Provider Note (Signed)
History     CSN: [id number]  Arrival date and time: 10/14/20 9024  First Provider Initiated Contact with Patient 10/14/20 1504     24 y.o. G2P0010 @[redacted]w[redacted]d  sent from [facility] after left ectopic pregnancy was found. She originally presented to the ED for low abdominal cramps. She denies pain currently. No recent VB.    OB History    Gravida  2   Para      Term      Preterm      AB  1   Living        SAB  1   TAB      Ectopic      Multiple      Live Births              Past Medical History:  Diagnosis Date  . Asthma     Past Surgical History:  Procedure Laterality Date  . NO PAST SURGERIES      Family History  Problem Relation Age of Onset  . Asthma Mother   . Asthma Father     Social History   Tobacco Use  . Smoking status: Never Smoker  . Smokeless tobacco: Never Used  Vaping Use  . Vaping Use: Never used  Substance Use Topics  . Alcohol use: No  . Drug use: No    Allergies:  Allergies  Allergen Reactions  . Iohexol Hives  . Eggs Or Egg-Derived Products Hives  . Montelukast Sodium Hives    Singulair  . Penicillins Rash    Has patient had a PCN reaction causing immediate rash, facial/tongue/throat swelling, SOB or lightheadedness with hypotension: Unknown Has patient had a PCN reaction causing severe rash involving mucus membranes or skin necrosis: Unknown Has patient had a PCN reaction that required hospitalization: Unknown Has patient had a PCN reaction occurring within the last 10 years: Unknown If all of the above answers are "NO", then may proceed with Cephalosporin use.     No medications prior to admission.    Review of Systems  Constitutional: Negative for fever.  Gastrointestinal: Negative for abdominal pain.  Genitourinary: Negative for dysuria, hematuria, urgency, vaginal bleeding and vaginal discharge.   Physical Exam   Blood pressure (!) 93/57, pulse 90, temperature 98.4 F (36.9 C), temperature source Oral, resp.  rate 18, height 5\' 6"  (1.676 m), weight 96 kg, last menstrual period 09/12/2020, SpO2 100 %, unknown if currently breastfeeding.  Physical Exam Vitals and nursing note reviewed.  Constitutional:      General: She is not in acute distress.    Appearance: Normal appearance.  HENT:     Head: Normocephalic and atraumatic.  Cardiovascular:     Rate and Rhythm: Normal rate.  Pulmonary:     Effort: Pulmonary effort is normal. No respiratory distress.  Abdominal:     General: There is no distension.     Palpations: Abdomen is soft. There is no mass.     Tenderness: There is no abdominal tenderness. There is no guarding or rebound.  Genitourinary:    Comments: deferred Musculoskeletal:        General: Normal range of motion.     Cervical back: Normal range of motion.  Skin:    General: Skin is warm and dry.  Neurological:     General: No focal deficit present.     Mental Status: She is alert and oriented to person, place, and time.  Psychiatric:        Mood and Affect:  Mood normal.        Behavior: Behavior normal.    Results for orders placed or performed during the hospital encounter of 10/14/20 (from the past 24 hour(s))  Urinalysis, Routine w reflex microscopic Urine, Clean Catch     Status: Abnormal   Collection Time: 10/14/20  8:29 AM  Result Value Ref Range   Color, Urine AMBER (A) YELLOW   APPearance CLOUDY (A) CLEAR   Specific Gravity, Urine 1.031 (H) 1.005 - 1.030   pH 5.0 5.0 - 8.0   Glucose, UA NEGATIVE NEGATIVE mg/dL   Hgb urine dipstick NEGATIVE NEGATIVE   Bilirubin Urine NEGATIVE NEGATIVE   Ketones, ur NEGATIVE NEGATIVE mg/dL   Protein, ur NEGATIVE NEGATIVE mg/dL   Nitrite NEGATIVE NEGATIVE   Leukocytes,Ua LARGE (A) NEGATIVE   RBC / HPF 6-10 0 - 5 RBC/hpf   WBC, UA >50 (H) 0 - 5 WBC/hpf   Bacteria, UA FEW (A) NONE SEEN   Squamous Epithelial / LPF 21-50 0 - 5   Mucus PRESENT   POC Urine P[facility])     Status: Abnormal   Collection Time:  10/14/20  8:47 AM  Result Value Ref Range   Preg Test, Ur POSITIVE (A) NEGATIVE  CBC with Differential     Status: Abnormal   Collection Time: 10/14/20 10:10 AM  Result Value Ref Range   WBC 13.6 (H) 4.0 - 10.5 K/uL   RBC 4.16 3.87 - 5.11 MIL/uL   Hemoglobin 12.8 12.0 - 15.0 g/dL   HCT 16.137.4 36 - 46 %   MCV 89.9 80.0 - 100.0 fL   MCH 30.8 26.0 - 34.0 pg   MCHC 34.2 30.0 - 36.0 g/dL   RDW 09.612.8 04.511.5 - 40.915.5 %   Platelets 448 (H) 150 - 400 K/uL   nRBC 0.0 0.0 - 0.2 %   Neutrophils Relative % 65 %   Neutro Abs 8.9 (H) 1.7 - 7.7 K/uL   Lymphocytes Relative 22 %   Lymphs Abs 3.0 0.7 - 4.0 K/uL   Monocytes Relative 8 %   Monocytes Absolute 1.1 (H) 0.1 - 1.0 K/uL   Eosinophils Relative 4 %   Eosinophils Absolute 0.6 (H) 0.0 - 0.5 K/uL   Basophils Relative 1 %   Basophils Absolute 0.1 0.0 - 0.1 K/uL   Immature Granulocytes 0 %   Abs Immature Granulocytes 0.05 0.00 - 0.07 K/uL  hCG, quantitative, pregnancy     Status: Abnormal   Collection Time: 10/14/20 10:21 AM  Result Value Ref Range   hCG, Beta Chain, Quant, S 165 (H) <5 mIU/mL  Wet prep, genital     Status: Abnormal   Collection Time: 10/14/20  1:26 PM   Specimen: PATH Cytology Cervicovaginal Ancillary Only  Result Value Ref Range   Yeast Wet Prep HPF POC NONE SEEN NONE SEEN   Trich, Wet Prep NONE SEEN NONE SEEN   Clue Cells Wet Prep HPF POC PRESENT (A) NONE SEEN   WBC, Wet Prep HPF POC FEW (A) NONE SEEN   Sperm NONE SEEN   Basic metabolic panel     Status: None   Collection Time: 10/14/20  1:45 PM  Result Value Ref Range   Sodium 135 135 - 145 mmol/L   Potassium 3.6 3.5 - 5.1 mmol/L   Chloride 102 98 - 111 mmol/L   CO2 24 22 - 32 mmol/L   Glucose, Bld 98 70 - 99 mg/dL   BUN 10 6 - 20 mg/dL  Creatinine, Ser 0.71 0.44 - 1.00 mg/dL   Calcium 9.0 8.9 - 77.8 mg/dL   GFR, Estimated >24 >23 mL/min   Anion gap 9 5 - 15  Type and screen [facility]     Status: None   Collection Time: 10/14/20  1:45 PM   Result Value Ref Range   ABO/RH(D) O POS    Antibody Screen NEG    Sample Expiration      10/17/2020,2359 Performed at [facility], [address], [place], [place] [address]   HIV Antibody (routine testing w rflx)     Status: None   Collection Time: 10/14/20  1:45 PM  Result Value Ref Range   HIV Screen 4th Generation wRfx Non Reactive Non Reactive  Respiratory Panel by RT PCR (Flu A&B, Covid) - Nasopharyngeal Swab     Status: None   Collection Time: 10/14/20  1:45 PM   Specimen: Nasopharyngeal Swab  Result Value Ref Range   SARS Coronavirus 2 by RT PCR NEGATIVE NEGATIVE   Influenza A by PCR NEGATIVE NEGATIVE   Influenza B by PCR NEGATIVE NEGATIVE  Hepatic function panel     Status: Abnormal   Collection Time: 10/14/20  1:45 PM  Result Value Ref Range   Total Protein 8.7 (H) 6.5 - 8.1 g/dL   Albumin 4.1 3.5 - 5.0 g/dL   AST 15 15 - 41 U/L   ALT 17 0 - 44 U/L   Alkaline Phosphatase 57 38 - 126 U/L   Total Bilirubin 0.8 0.3 - 1.2 mg/dL   Bilirubin, Direct 0.1 0.0 - 0.2 mg/dL   Indirect Bilirubin 0.7 0.3 - 0.9 mg/dL   US OB Comp < 14 Wks  Result Date: 10/14/2020 CLINICAL DATA:  Pelvic pain.  Positive pregnancy test. EXAM: TRANSVAGINAL OBSTETRIC ULTRASOUND TECHNIQUE: Transvaginal ultrasound was performed for complete evaluation of the gestation as well as the maternal uterus, adnexal regions, and pelvic cul-de-sac. COMPARISON:  None. FINDINGS: Intrauterine gestational sac: None Maternal uterus/adnexae: Retroverted uterus. Endometrial thickness measures 14 mm. No fibroids identified. Both ovaries are normal in appearance. A small heterogeneous hypoechoic mass is seen in the left adnexa adjacent to the ovary, which measures 2.1 x 1.8 x 2.3 cm. This shows hypervascularity on color Doppler ultrasound, and is suspicious for ectopic pregnancy. Small amount of simple free fluid noted in pelvic cul-de-sac. IMPRESSION: No IUP visualized. 2.3 cm left adnexal mass and small  amount of free fluid, highly suspicious for ectopic pregnancy. Critical Value/emergent results were called by telephone at the time of interpretation on 10/14/2020 at 12:18 pm to nurse care provider [place] in the ED, who verbally acknowledged these results. Electronically Signed   By: Danae Orleans M.D.   On: 10/14/2020 12:25   MAU Course  Procedures  MDM Labs and [place] reviewed. Consult with Dr. Alysia Penna, recommend MTX for treatment of ectopic.  The risks of methotrexate were reviewed including failure requiring repeat dosing or eventual surgery. She understands that methotrexate involves frequent return visits to monitor lab values and that she remains at risk of ectopic rupture until her beta is less than assay. The patient opts to proceed with methotrexate. She has no history of hepatic or renal dysfunction, has normal BUN/Cr/LFT's/platelets. She is felt to be reliable for follow-up. Side effects of photosensitivity & GI upset were discussed. She knows to avoid direct sunlight and abstain from alcohol, aspirin and aspirin-like products for two weeks. She was counseled to discontinue any MVI with folic acid. She understands  to follow up on D4 (10/17/20) and D7 (10/20/20) for repeat BHCG and was given the instruction sheet. Strict ectopic precautions were reviewed, the patient knows to call with any abdominal pain, vomiting, fainting, or any concerns with her health. Rh pos. Pt is stable for discharge home.   Assessment and Plan   1. Left ovarian pregnancy without intrauterine pregnancy   2. Left tubal pregnancy without intrauterine pregnancy    Discharge home Follow up at [facility] on 11/15 @0830  Strict ectopic precautions Pelvic rest  Allergies as of 10/14/2020      Reactions   Iohexol Hives   Eggs Or Egg-derived Products Hives   Montelukast Sodium Hives   Singulair   Penicillins Rash   Has patient had a PCN reaction causing immediate rash, facial/tongue/throat swelling, SOB or lightheadedness  with hypotension: Unknown Has patient had a PCN reaction causing severe rash involving mucus membranes or skin necrosis: Unknown Has patient had a PCN reaction that required hospitalization: Unknown Has patient had a PCN reaction occurring within the last 10 years: Unknown If all of the above answers are "NO", then may proceed with Cephalosporin use.      Medication List    STOP taking these medications   dicyclomine 20 MG tablet Commonly known as: BENTYL   nitrofurantoin (macrocrystal-monohydrate) 100 MG capsule Commonly known as: 13/11/2020, CNM 10/14/2020, 6:59 PM

## 2020-10-14 NOTE — ED Triage Notes (Signed)
Patient states she is late on her period and wants to make sure she is not pregnant. Last period October 11-16th, has had unprotected sex in the last month, no condom or BC use. Endorses suprapubic pain, denies changes in vaginal discharge, endorses dark urine w/ odor.

## 2020-10-14 NOTE — ED Provider Notes (Signed)
[facility]-EMERGENCY DEPT Provider Note   CSN: [id number] Arrival date & time: 10/14/20  1245     History No chief complaint on file.   Kathryn Nash is a 24 y.o. female.  24 year old female, G2P0 (sp ab 1 year ago), LMP 09/12/20 presents with complaint of concern for possible pregnancy. Patient reports her menstrual cycle is late, she took a home pregnancy test and was uncertain of the results so she came to the ER. Patient reports lower abdominal cramping at this time, denies vaginal bleeding or spotting. No other complaints or concerns.         Past Medical History:  Diagnosis Date  . Asthma     Patient Active Problem List   Diagnosis Date Noted  . Morbid obesity (HCC) 210 lbs 10/20/2019  . Asthma exacerbation 12/08/2018    History reviewed. No pertinent surgical history.   OB History    Gravida  1   Para      Term      Preterm      AB      Living        SAB      TAB      Ectopic      Multiple      Live Births              Family History  Problem Relation Age of Onset  . Asthma Mother   . Asthma Father     Social History   Tobacco Use  . Smoking status: Never Smoker  . Smokeless tobacco: Never Used  Vaping Use  . Vaping Use: Never used  Substance Use Topics  . Alcohol use: No  . Drug use: No    Home Medications Prior to Admission medications   Medication Sig Start Date End Date Taking? Authorizing Provider  dicyclomine (BENTYL) 20 MG tablet Take 1 tablet (20 mg total) by mouth 2 (two) times daily. Patient not taking: Reported on 10/14/2020 09/12/20   Gailen Shelter, PA  nitrofurantoin, macrocrystal-monohydrate, (MACROBID) 100 MG capsule Take 1 capsule (100 mg total) by mouth 2 (two) times daily. Patient not taking: Reported on 10/14/2020 09/12/20   Gailen Shelter, PA    Allergies    Iohexol, Eggs or egg-derived products, Montelukast sodium, and Penicillins  Review of Systems   Review of Systems   Constitutional: Negative for fever.  Gastrointestinal: Negative for abdominal pain, constipation, diarrhea, nausea and vomiting.  Genitourinary: Positive for pelvic pain. Negative for dysuria, vaginal bleeding and vaginal discharge.  Musculoskeletal: Negative for back pain.  Skin: Negative for rash and wound.  Allergic/Immunologic: Negative for immunocompromised state.  Neurological: Negative for weakness.  All other systems reviewed and are negative.   Physical Exam Updated Vital Signs BP 100/75 (BP Location: Left Arm)   Pulse 71   Temp 98.4 F (36.9 C) (Oral)   Resp 16   Ht 5\' 6"  (1.676 m)   Wt 96 kg   LMP 09/12/2020   SpO2 100%   BMI 34.16 kg/m   Physical Exam Vitals and nursing note reviewed.  Constitutional:      General: She is not in acute distress.    Appearance: She is well-developed. She is not diaphoretic.  HENT:     Head: Normocephalic and atraumatic.  Cardiovascular:     Rate and Rhythm: Normal rate and regular rhythm.     Pulses: Normal pulses.     Heart sounds: Normal heart sounds.  Pulmonary:  Effort: Pulmonary effort is normal.     Breath sounds: Normal breath sounds.  Abdominal:     Palpations: Abdomen is soft.     Tenderness: There is generalized abdominal tenderness. There is no right CVA tenderness or left CVA tenderness.     Comments: Mild generalized tenderness.   Genitourinary:    Vagina: Vaginal discharge present.     Cervix: No cervical motion tenderness.     Uterus: Normal. Not tender.      Adnexa: Right adnexa normal and left adnexa normal.     Comments: Thick white dc present. No pelvic/adnexal tenderness  Musculoskeletal:     Right lower leg: No edema.     Left lower leg: No edema.  Skin:    General: Skin is warm and dry.     Findings: No erythema or rash.  Neurological:     Mental Status: She is alert and oriented to person, place, and time.  Psychiatric:        Behavior: Behavior normal.     ED Results / Procedures /  Treatments   Labs (all labs ordered are listed, but only abnormal results are displayed) Labs Reviewed  URINALYSIS, ROUTINE W REFLEX MICROSCOPIC - Abnormal; Notable for the following components:      Result Value   Color, Urine AMBER (*)    APPearance CLOUDY (*)    Specific Gravity, Urine 1.031 (*)    Leukocytes,Ua LARGE (*)    WBC, UA >50 (*)    Bacteria, UA FEW (*)    All other components within normal limits  CBC WITH DIFFERENTIAL/PLATELET - Abnormal; Notable for the following components:   WBC 13.6 (*)    Platelets 448 (*)    Neutro Abs 8.9 (*)    Monocytes Absolute 1.1 (*)    Eosinophils Absolute 0.6 (*)    All other components within normal limits  HCG, QUANTITATIVE, PREGNANCY - Abnormal; Notable for the following components:   hCG, Beta Chain, Quant, S 165 (*)    All other components within normal limits  POC URINE PREG, ED - Abnormal; Notable for the following components:   Preg Test, Ur POSITIVE (*)    All other components within normal limits  WET PREP, GENITAL  RESPIRATORY PANEL BY RT PCR (FLU A&B, COVID)  BASIC METABOLIC PANEL  RPR  HIV ANTIBODY (ROUTINE TESTING W REFLEX)  I-STAT BETA HCG BLOOD, ED ([facility], [facility], [facility] ONLY)  TYPE AND SCREEN  GC/CHLAMYDIA PROBE AMP ([place]) NOT AT [facility]    EKG None  Radiology US OB Comp < 14 Wks  Result Date: 10/14/2020 CLINICAL DATA:  Pelvic pain.  Positive pregnancy test. EXAM: TRANSVAGINAL OBSTETRIC ULTRASOUND TECHNIQUE: Transvaginal ultrasound was performed for complete evaluation of the gestation as well as the maternal uterus, adnexal regions, and pelvic cul-de-sac. COMPARISON:  None. FINDINGS: Intrauterine gestational sac: None Maternal uterus/adnexae: Retroverted uterus. Endometrial thickness measures 14 mm. No fibroids identified. Both ovaries are normal in appearance. A small heterogeneous hypoechoic mass is seen in the left adnexa adjacent to the ovary, which measures 2.1 x 1.8 x 2.3 cm. This shows hypervascularity on  color Doppler ultrasound, and is suspicious for ectopic pregnancy. Small amount of simple free fluid noted in pelvic cul-de-sac. IMPRESSION: No IUP visualized. 2.3 cm left adnexal mass and small amount of free fluid, highly suspicious for ectopic pregnancy. Critical Value/emergent results were called by telephone at the time of interpretation on 10/14/2020 at 12:18 pm to nurse care provider [place] in the ED, who verbally  acknowledged these results. Electronically Signed   By: Danae Orleans M.D.   On: 10/14/2020 12:25    Procedures .Critical Care Performed by: Jeannie Fend, PA-C Authorized by: Jeannie Fend, PA-C   Critical care provider statement:    Critical care time (minutes):  45   Critical care was time spent personally by me on the following activities:  Discussions with consultants, evaluation of patient's response to treatment, examination of patient, ordering and performing treatments and interventions, ordering and review of laboratory studies, ordering and review of radiographic studies, pulse oximetry, re-evaluation of patient's condition, obtaining history from patient or surrogate and review of old charts   (including critical care time)  Medications Ordered in ED Medications - No data to display  ED Course  I have reviewed the triage vital signs and the nursing notes.  Pertinent labs & imaging results that were available during my care of the patient were reviewed by me and considered in my medical decision making (see chart for details).  Clinical Course as of Oct 14 1338  Fri Oct 14, 2020  6081 24 year old female presents with request for pregnancy test, reports mild pelvic cramping. Urine pregnancy test is positive in triage, pelvic ultrasound ordered from triage results with concern for left ectopic pregnancy.  Patient was placed in a room, found to have mild generalized abdominal tenderness however on pelvic exam is nontender.  Patient does have thick white vaginal  discharge. Beta-hCG elevated at 165, CBC with mild leukocytosis with white count 13.6. Vitals stable.  Case discussed with Dr. Alysia Penna with OB/GYN at [facility], requests patient to be sent to MAU for further care.  Discussed results and plan of care with patient.    [LM]    Clinical Course User Index [LM] Alden Hipp   MDM Rules/Calculators/A&P                          Final Clinical Impression(s) / ED Diagnoses Final diagnoses:  Left ovarian pregnancy without intrauterine pregnancy    Rx / DC Orders ED Discharge Orders    None       Alden Hipp 10/14/20 1339    Linwood Dibbles, MD 10/15/20 [phone]

## 2020-10-14 NOTE — MAU Note (Signed)
Pt arrived via care link with dx of ectopic pregnancy . Pt denies pain or discomfort at this time. Has questioned on why she was sent here. Told here our providers will review all the lavs and U/S and give results and POC.

## 2020-10-14 NOTE — ED Notes (Signed)
Provider at bedside

## 2020-10-14 NOTE — Discharge Instructions (Signed)
Methotrexate Treatment for an Ectopic Pregnancy, Care After This sheet gives you information about how to care for yourself after your procedure. Your health care provider may also give you more specific instructions. If you have problems or questions, contact your health care provider. What can I expect after the procedure? After the procedure, it is common to have:  Abdominal cramping.  Vaginal bleeding.  Fatigue.  Nausea.  Vomiting.  Diarrhea. Blood tests will be taken at timed intervals for several days or weeks to check your pregnancy hormone levels. The blood tests will be done until the pregnancy hormone can no longer be detected in the blood. Follow these instructions at home: Activity  Do not have sex until your health care provider approves.  Limit activities that take a lot of effort as told by your health care provider. Medicines  Take over the counter and prescription medicines only as told by your health care provider.  Do not take aspirin, ibuprofen, naproxen, or any other NSAIDs.  Do not take folic acid, prenatal vitamins, or other vitamins that contain folic acid. General instructions   Do not drink alcohol.  Follow instructions from your health care provider on how and when to report any symptoms that may indicate a ruptured ectopic pregnancy.  Keep all follow-up visits as told by your health care provider. This is important. Contact a health care provider if:  You have persistent nausea and vomiting.  You have persistent diarrhea.  You are having a reaction to the medicine, such as: ? Tiredness. ? Skin rash. ? Hair loss. Get help right away if:  Your abdominal or pelvic pain gets worse.  You have more vaginal bleeding.  You feel light-headed or you faint.  You have shortness of breath.  Your heart rate increases.  You develop a cough.  You have chills.  You have a fever. Summary  After the procedure, it is common to have symptoms  of abdominal cramping, vaginal bleeding and fatigue. You may also experience other symptoms.  Blood tests will be taken at timed intervals for several days or weeks to check your pregnancy hormone levels. The blood tests will be done until the pregnancy hormone can no longer be detected in the blood.  Limit strenuous activity as told by your health care provider.  Follow instructions from your health care provider on how and when to report any symptoms that may indicate a ruptured ectopic pregnancy. This information is not intended to replace advice given to you by your health care provider. Make sure you discuss any questions you have with your health care provider. Document Revised: 11/01/2017 Document Reviewed: 01/08/2017 Elsevier Patient Education  2020 Elsevier Inc.  

## 2020-10-15 ENCOUNTER — Ambulatory Visit (HOSPITAL_COMMUNITY): Admit: 2020-10-15 | Discharge status: Absent without leave | Payer: Medicaid Other

## 2020-10-15 ENCOUNTER — Ambulatory Visit (HOSPITAL_COMMUNITY)
Admission: EM | Admit: 2020-10-15 | Discharge: 2020-10-15 | Disposition: A | Discharge status: Home / Self Care | Payer: Medicaid Other | Attending: Family Medicine | Admitting: Family Medicine

## 2020-10-15 ENCOUNTER — Other Ambulatory Visit: Payer: Self-pay

## 2020-10-15 DIAGNOSIS — Z113 Encounter for screening for infections with a predominantly sexual mode of transmission: Secondary | ICD-10-CM | POA: Insufficient documentation

## 2020-10-15 LAB — RPR
RPR Ser Ql: REACTIVE — AB
RPR Titer: 1:1 {titer}

## 2020-10-15 NOTE — ED Notes (Signed)
Blood was collected and patient was informed her result would be available on MyChart. I inform the patient if anything was abnormal she would be contact via phone.

## 2020-10-16 LAB — RPR: RPR Ser Ql: NONREACTIVE

## 2020-10-17 ENCOUNTER — Ambulatory Visit (INDEPENDENT_AMBULATORY_CARE_PROVIDER_SITE_OTHER): Payer: Medicaid Other | Admitting: *Deleted

## 2020-10-17 ENCOUNTER — Other Ambulatory Visit: Payer: Self-pay

## 2020-10-17 ENCOUNTER — Encounter: Payer: Self-pay | Admitting: *Deleted

## 2020-10-17 VITALS — BP 116/75 | HR 87 | Ht 66.0 in | Wt 211.9 lb

## 2020-10-17 DIAGNOSIS — O00102 Left tubal pregnancy without intrauterine pregnancy: Secondary | ICD-10-CM | POA: Diagnosis not present

## 2020-10-17 LAB — GC/CHLAMYDIA PROBE AMP (~~LOC~~) NOT AT ARMC
Chlamydia: NEGATIVE
Comment: NEGATIVE
Comment: NORMAL
Neisseria Gonorrhea: POSITIVE — AB

## 2020-10-17 LAB — T.PALLIDUM AB, TOTAL: T Pallidum Abs: NONREACTIVE

## 2020-10-17 LAB — BETA HCG QUANT (REF LAB): hCG Quant: 724 m[IU]/mL

## 2020-10-17 NOTE — Progress Notes (Signed)
Pt states having intermittent cramping - she does not take anything for the pain. She denies any bleeding since MTX administration. Pt had elevated PHQ-9 score today and was offered [facility] - she declined. Pt was advised that she will be called with results and plan of care today. She stated that a detailed message can be left on her VM if she does not answer. Pt voiced understanding.   1215  BHCG results (724) reviewed by Dr. Earlene Plater who advises that pt should continue with plan of care for stat BHCG on 11/18 (day7).   1300  Pt was called and informed of test results. She should return as scheduled on 11/18. She may take Tylenol or Ibuprofen for the cramping if desired. She should return to MAU if her pain becomes significantly worse. Pt voiced understanding of information and instructions given.

## 2020-10-17 NOTE — Progress Notes (Signed)
I have reviewed this chart and agree with the RN/CMA assessment and management.    K. Meryl Roberth Berling, M.D. Attending [facility] (Faculty Practice)   

## 2020-10-20 ENCOUNTER — Inpatient Hospital Stay (HOSPITAL_COMMUNITY)
Admission: AD | Admit: 2020-10-20 | Discharge: 2020-10-20 | Disposition: A | Discharge status: Home / Self Care | Payer: Medicaid Other | Attending: Obstetrics and Gynecology | Admitting: Obstetrics and Gynecology

## 2020-10-20 ENCOUNTER — Other Ambulatory Visit: Payer: Self-pay

## 2020-10-20 ENCOUNTER — Inpatient Hospital Stay (HOSPITAL_COMMUNITY): Payer: Medicaid Other

## 2020-10-20 ENCOUNTER — Ambulatory Visit (INDEPENDENT_AMBULATORY_CARE_PROVIDER_SITE_OTHER): Payer: Medicaid Other | Admitting: General Practice

## 2020-10-20 VITALS — BP 96/49 | HR 79 | Ht 66.0 in | Wt 216.0 lb

## 2020-10-20 DIAGNOSIS — A549 Gonococcal infection, unspecified: Secondary | ICD-10-CM | POA: Diagnosis not present

## 2020-10-20 DIAGNOSIS — O00102 Left tubal pregnancy without intrauterine pregnancy: Secondary | ICD-10-CM | POA: Insufficient documentation

## 2020-10-20 DIAGNOSIS — Z3A00 Weeks of gestation of pregnancy not specified: Secondary | ICD-10-CM | POA: Diagnosis not present

## 2020-10-20 DIAGNOSIS — O26899 Other specified pregnancy related conditions, unspecified trimester: Secondary | ICD-10-CM | POA: Diagnosis not present

## 2020-10-20 LAB — CBC
HCT: 37.5 % (ref 36.0–46.0)
Hemoglobin: 12.3 g/dL (ref 12.0–15.0)
MCH: 29.8 pg (ref 26.0–34.0)
MCHC: 32.8 g/dL (ref 30.0–36.0)
MCV: 90.8 fL (ref 80.0–100.0)
Platelets: 388 10*3/uL (ref 150–400)
RBC: 4.13 MIL/uL (ref 3.87–5.11)
RDW: 12.7 % (ref 11.5–15.5)
WBC: 12.1 10*3/uL — ABNORMAL HIGH (ref 4.0–10.5)
nRBC: 0 % (ref 0.0–0.2)

## 2020-10-20 LAB — HCG, QUANTITATIVE, PREGNANCY: hCG, Beta Chain, Quant, S: 537 m[IU]/mL — ABNORMAL HIGH (ref <5)

## 2020-10-20 LAB — BETA HCG QUANT (REF LAB): hCG Quant: 528 m[IU]/mL

## 2020-10-20 MED ORDER — CEFTRIAXONE SODIUM 500 MG IJ SOLR
500.0000 mg | Freq: Once | INTRAMUSCULAR | Status: DC
  Administered 2020-10-20: 500 mg via INTRAMUSCULAR

## 2020-10-20 NOTE — MAU Provider Note (Signed)
History     CSN: [id number]  Arrival date and time: 10/20/20 0958  24 y.o. G2P0010 with know left ectopic pregnancy sent from office for increased pain. She presented to the office for day 7 qhcg aftre MTX.  Her qhcg when she received MTX was 165. Her day 4 qhcg was 724. She reports increased frequency and intensity of cramping x2 days. She has not taken anything for the pain. Denies VB.  OB History    Gravida  2   Para      Term      Preterm      AB  1   Living        SAB  1   TAB      Ectopic      Multiple      Live Births              Past Medical History:  Diagnosis Date  . Asthma     Past Surgical History:  Procedure Laterality Date  . NO PAST SURGERIES      Family History  Problem Relation Age of Onset  . Asthma Mother   . Asthma Father     Social History   Tobacco Use  . Smoking status: Never Smoker  . Smokeless tobacco: Never Used  Vaping Use  . Vaping Use: Never used  Substance Use Topics  . Alcohol use: No  . Drug use: No    Allergies:  Allergies  Allergen Reactions  . Iohexol Hives  . Eggs Or Egg-Derived Products Hives  . Montelukast Sodium Hives    Singulair  . Penicillins Rash    Has patient had a PCN reaction causing immediate rash, facial/tongue/throat swelling, SOB or lightheadedness with hypotension: Unknown Has patient had a PCN reaction causing severe rash involving mucus membranes or skin necrosis: Unknown Has patient had a PCN reaction that required hospitalization: Unknown Has patient had a PCN reaction occurring within the last 10 years: Unknown If all of the above answers are "NO", then may proceed with Cephalosporin use.     No medications prior to admission.    Review of Systems  Gastrointestinal: Positive for abdominal pain.  Genitourinary: Negative for vaginal bleeding and vaginal discharge.   Physical Exam   Blood pressure 132/70, pulse 69, temperature 98.3 F (36.8 C), resp. rate 18, last  menstrual period 09/12/2020, SpO2 98 %, unknown if currently breastfeeding.  Physical Exam Vitals and nursing note reviewed.  Constitutional:      General: She is not in acute distress.    Appearance: Normal appearance.  HENT:     Head: Normocephalic and atraumatic.  Cardiovascular:     Rate and Rhythm: Normal rate.  Pulmonary:     Effort: Pulmonary effort is normal. No respiratory distress.  Abdominal:     General: There is no distension.     Palpations: Abdomen is soft. There is no mass.     Tenderness: There is no abdominal tenderness. There is no guarding or rebound.  Genitourinary:    Comments: deferred Musculoskeletal:        General: Normal range of motion.     Cervical back: Normal range of motion.  Skin:    General: Skin is warm and dry.  Neurological:     General: No focal deficit present.     Mental Status: She is alert and oriented to person, place, and time.  Psychiatric:        Mood and Affect: Mood normal.  Behavior: Behavior normal.     Results for orders placed or performed during the hospital encounter of 10/20/20 (from the past 24 hour(s))  CBC     Status: Abnormal   Collection Time: 10/20/20 10:38 AM  Result Value Ref Range   WBC 12.1 (H) 4.0 - 10.5 K/uL   RBC 4.13 3.87 - 5.11 MIL/uL   Hemoglobin 12.3 12.0 - 15.0 g/dL   HCT 25.4 36 - 46 %   MCV 90.8 80.0 - 100.0 fL   MCH 29.8 26.0 - 34.0 pg   MCHC 32.8 30.0 - 36.0 g/dL   RDW 27.0 62.3 - 76.2 %   Platelets 388 150 - 400 K/uL   nRBC 0.0 0.0 - 0.2 %  hCG, quantitative, pregnancy     Status: Abnormal   Collection Time: 10/20/20 10:38 AM  Result Value Ref Range   hCG, Beta Chain, Quant, S 537 (H) <5 mIU/mL   US OB Transvaginal  Addendum Date: 10/20/2020   ADDENDUM REPORT: 10/20/2020 13:44 ADDENDUM: Error to the previous technique is noted. Endovaginal sonography was performed for this evaluation. Electronically Signed   By: Donzetta Kohut M.D.   On: 10/20/2020 13:44   Result Date:  10/20/2020 CLINICAL DATA:  History of ectopic pregnancy. EXAM: OBSTETRIC <14 WK ULTRASOUND TECHNIQUE: Transabdominal ultrasound was performed for evaluation of the gestation as well as the maternal uterus and adnexal regions. COMPARISON:  10/14/2020 FINDINGS: Intrauterine gestational sac: Area labeled "gestational sac" in the uterine fundus with fluid like characteristics measuring approximately 5.3 mm mm greatest dimension is of uncertain significance. Endometrial thickening is diminished compared to the previous exam. Yolk sac:  Not Visualized. Embryo:  Not Visualized. Subchorionic hemorrhage:  None visualized. Maternal uterus/adnexae: Diminished pelvic fluid compared to the previous exam. No complex appearing fluid. LEFT ovary with adjacent area displaying increased peripheral blood flow, not clearly extra ovarian, appears more convincingly arising from the ovary on today's study, appeared more likely to be extra ovarian on the previous imaging. This area has diminished in size when compared to the previous exam measuring approximately 1.6 x 1.4 cm as compared to 2.1 x 1.8 cm. RIGHT ovary with normal appearance. IMPRESSION: Area adjacent to or arising from the LEFT ovary, appears more likely to be arising from LEFT ovary on the current study but appeared on prior images to be separate from the ovary is smaller. Area felt to represent ectopic pregnancy on the prior study is smaller on the current exam. Fluid containing structure in the endometrial canal more likely pseudo gestational sac given provided history. Electronically Signed: By: Donzetta Kohut M.D. On: 10/20/2020 12:52   MAU Course  Procedures  MDM Labs and imaging ordered and reviewed. No signs of ectopic rupture. Clinically appears well. Consult with Dr. Vergie Living, images and labs reviewed. Plan for qhcg in 1 week. Stable for discharge home.   Assessment and Plan   1. Left tubal pregnancy without intrauterine pregnancy    Discharge  home Follow up at [facility] in 1 week- message sent Strict return precautions  Allergies as of 10/20/2020      Reactions   Iohexol Hives   Eggs Or Egg-derived Products Hives   Montelukast Sodium Hives   Singulair   Penicillins Rash   Has patient had a PCN reaction causing immediate rash, facial/tongue/throat swelling, SOB or lightheadedness with hypotension: Unknown Has patient had a PCN reaction causing severe rash involving mucus membranes or skin necrosis: Unknown Has patient had a PCN reaction that required hospitalization: Unknown Has  patient had a PCN reaction occurring within the last 10 years: Unknown If all of the above answers are "NO", then may proceed with Cephalosporin use.      Medication List    You have not been prescribed any medications.    Donette Larry, CNM 10/20/2020, 1:54 PM

## 2020-10-20 NOTE — Progress Notes (Signed)
Patient was assessed and managed by nursing staff during this encounter. I have reviewed the chart and agree with the documentation and plan. I have also made any necessary editorial changes.  Catalina Antigua, MD 10/20/2020 1:30 PM

## 2020-10-20 NOTE — MAU Note (Signed)
[facility] Had MTx  On Friday. Reported pelvic pain was at a 10.  Denies any bleeding. . Was treated today for Gonorrhea. Sent to MAU for further eval. Need to redraw BHCG today.

## 2020-10-20 NOTE — Discharge Instructions (Signed)
Ectopic Pregnancy ° °An ectopic pregnancy happens when a fertilized egg grows outside the womb (uterus). The fertilized egg cannot stay alive outside of the womb. This problem often happens in a fallopian tube. It is often caused by damage to the tube. °If this problem is found early, you may be treated with medicine that stops the egg from growing. If your tube tears or bursts open (ruptures), you will bleed inside. Often, there is very bad pain in the lower belly. This is an emergency. You will need surgery. Get help right away. °Follow these instructions at home: °After being treated with medicine or surgery: °· Rest and limit your activity for as long as told by your doctor. °· Until your doctor says that it is safe: °? Do not lift anything that is heavier than 10 lb (4.5 kg) or the limit that your doctor tells you. °? Avoid exercise and any movement that takes a lot of effort. °· To prevent problems when pooping (constipation): °? Eat a healthy diet. This includes: °§ Fruits. °§ Vegetables. °§ Whole grains. °? Drink 6-8 glasses of water a day. °Contact a doctor if: °Get help right away if: °· You have sudden and very bad pain in your belly. °· You have very bad pain in your shoulders or neck. °· You have pain that gets worse and is not helped by medicine. °· You have: °? A fever or chills. °? Vaginal bleeding. °? Redness or swelling at the site of a surgical cut (incision). °· You feel sick to your stomach (nauseous) or you throw up (vomit). °· You feel dizzy or weak. °· You feel light-headed or you pass out (faint). °Summary °· An ectopic pregnancy happens when a fertilized egg grows outside the womb (uterus). °· If this problem is found early, you may be treated with medicine that stops the egg from growing. °· If your tube tears or bursts open (ruptures), you will need surgery. This is an emergency. Get help right away. °This information is not intended to replace advice given to you by your health care  provider. Make sure you discuss any questions you have with your health care provider. °Document Revised: 11/01/2017 Document Reviewed: 12/13/2016 °Elsevier Patient Education © 2020 Elsevier Inc. ° °

## 2020-10-20 NOTE — Progress Notes (Signed)
Patient presents to office today for stat bhcg day #7 labs following up from MTX administration on 11/12. Patient states her pain has worsened since Monday. She reports her pain 10/10 at times in LLQ. She states pain was more intermittent previously and now it's becoming more consistent. Patient denies pain. Patient reports she saw her + gonorrhea results and was told we can treat her at the office. Patient denies bleeding. Per chart review, previous ultrasound reveals adnexal mass with free fluid in pelvis. Bhcg on 11/12 was 165 and on 11/15 724. Reviewed all with Dr Jolayne Panther who advises patient go to MAU for bhcg & ultrasound given worsening pain. Patient was treated with Rocephin 500mg  IM prior to leaving the office. MAU was called & notified. BHCG order in office was cancelled via [facility] but results did finalize of 528 today.   RN BSN 10/20/20

## 2020-10-25 ENCOUNTER — Other Ambulatory Visit: Payer: Self-pay | Admitting: *Deleted

## 2020-10-25 DIAGNOSIS — O00102 Left tubal pregnancy without intrauterine pregnancy: Secondary | ICD-10-CM

## 2020-10-26 ENCOUNTER — Other Ambulatory Visit: Payer: Medicaid Other

## 2020-11-02 DIAGNOSIS — Z419 Encounter for procedure for purposes other than remedying health state, unspecified: Secondary | ICD-10-CM | POA: Diagnosis not present

## 2020-11-16 ENCOUNTER — Ambulatory Visit (HOSPITAL_COMMUNITY): Payer: Self-pay

## 2020-11-19 ENCOUNTER — Ambulatory Visit (HOSPITAL_COMMUNITY): Payer: Self-pay

## 2020-12-03 DIAGNOSIS — Z419 Encounter for procedure for purposes other than remedying health state, unspecified: Secondary | ICD-10-CM | POA: Diagnosis not present

## 2020-12-08 ENCOUNTER — Ambulatory Visit: Payer: Medicaid Other | Admitting: Family Medicine

## 2020-12-08 ENCOUNTER — Other Ambulatory Visit: Payer: Self-pay

## 2020-12-08 ENCOUNTER — Encounter: Payer: Self-pay | Admitting: Family Medicine

## 2020-12-08 DIAGNOSIS — A599 Trichomoniasis, unspecified: Secondary | ICD-10-CM

## 2020-12-08 DIAGNOSIS — Z113 Encounter for screening for infections with a predominantly sexual mode of transmission: Secondary | ICD-10-CM

## 2020-12-08 LAB — WET PREP FOR TRICH, YEAST, CLUE
Trichomonas Exam: POSITIVE — AB
Yeast Exam: NEGATIVE

## 2020-12-08 MED ORDER — METRONIDAZOLE 500 MG PO TABS: 500.0000 mg | ORAL_TABLET | Freq: Two times a day (BID) | ORAL | 0 refills | Status: AC

## 2020-12-08 NOTE — Progress Notes (Signed)
Reports treated for ectopic pregnancy with a shot at [facility] in [place] 10/2020. Also reports diagnosed and treated for gonorrhea at same time. Jossie Ng, RN

## 2020-12-08 NOTE — Progress Notes (Signed)
[facility] Department STI clinic/screening visit  Subjective:  Kathryn Nash is a 25 y.o. female being seen today for an STI screening visit. The patient reports they do not have symptoms.  Patient reports that they do desire a pregnancy in the next year.   They reported they are not interested in discussing contraception today.  Patient's last menstrual period was 11/26/2020 (exact date).   Patient has the following medical conditions:   Patient Active Problem List   Diagnosis Date Noted  . Morbid obesity (HCC) 210 lbs 10/20/2019  . Asthma exacerbation 12/08/2018    Chief Complaint  Patient presents with  . SEXUALLY TRANSMITTED DISEASE    Patient is here for follow up from [facility]     HPI  Patient reports she is here for follow up from + GC diagnosed at [facility].    Last HIV test per patient/review of record was 10/23/2020 Patient reports last pap was    See flowsheet for further details and programmatic requirements.    The following portions of the patient's history were reviewed and updated as appropriate: allergies, current medications, past medical history, past social history, past surgical history and problem list.  Objective:  There were no vitals filed for this visit.  Physical Exam Vitals and nursing note reviewed.  Constitutional:      Appearance: Normal appearance.  HENT:     Head: Normocephalic and atraumatic.     Mouth/Throat:     Mouth: Mucous membranes are moist.     Pharynx: Oropharynx is clear. No oropharyngeal exudate or posterior oropharyngeal erythema.  Pulmonary:     Effort: Pulmonary effort is normal.  Chest:  Breasts:     Right: No axillary adenopathy or supraclavicular adenopathy.     Left: No axillary adenopathy or supraclavicular adenopathy.    Abdominal:     General: Abdomen is flat.     Palpations: There is no mass.     Tenderness: There is no abdominal tenderness. There is no rebound.  Genitourinary:    Exam position:  Lithotomy position.     Pubic Area: No rash or pubic lice.      Labia:        Right: No rash or lesion.        Left: No rash or lesion.      Vagina: Normal. No vaginal discharge, erythema, bleeding or lesions.     Cervix: No cervical motion tenderness, discharge, friability, lesion or erythema.     Uterus: Normal.      Adnexa: Right adnexa normal and left adnexa normal.     Comments: Patient declined pelvic exam,  Self collected.   Lymphadenopathy:     Head:     Right side of head: No preauricular or posterior auricular adenopathy.     Left side of head: No preauricular or posterior auricular adenopathy.     Cervical: No cervical adenopathy.     Upper Body:     Right upper body: No supraclavicular or axillary adenopathy.     Left upper body: No supraclavicular or axillary adenopathy.     Lower Body: No right inguinal adenopathy. No left inguinal adenopathy.  Skin:    General: Skin is warm and dry.     Findings: No rash.  Neurological:     Mental Status: She is alert and oriented to person, place, and time.      Assessment and Plan:  Kathryn Nash is a 25 y.o. female presenting to the [facility]  Health Department for STI screening  1. Screening examination for venereal disease  Patient in clinic to follow up on + STI from [facility].  Patient was treated for + GC.  Patient denies any s/sx.  Patient desires pregnancy.  Patient educated on STI infections and effects on pregnancy  Patient verbalizes understanding.    - WET PREP FOR TRICH, YEAST, CLUE - Chlamydia/Gonorrhea [facility]  Patient accepted all screenings including vaginal CT/GC and  Declines bloodwork for HIV/RPR.  Patient meets criteria for HepB screening? No. Ordered? No - does not meet critera  Patient meets criteria for HepC screening? No. Ordered? No - does not meet critera   Wet prep results + Trich    2. Trichimoniasis Treatment needed for Trich per standing order.    - metroNIDAZOLE (FLAGYL) 500 MG  tablet; Take 1 tablet (500 mg total) by mouth 2 (two) times daily for 7 days.  Dispense: 14 tablet; Refill: 0  Discussed time line for State Lab results and that patient will be called with positive results and encouraged patient to call if she had not heard in 2 weeks.  Counseled to return or seek care for continued or worsening symptoms Recommended condom use with all sex  Patient is currently using no to prevent pregnancy. Patient desires.    Return in about 3 months (around 03/08/2021).  No future appointments.  Wendi Snipes, FNP

## 2020-12-15 ENCOUNTER — Telehealth: Payer: Self-pay

## 2020-12-15 ENCOUNTER — Other Ambulatory Visit: Payer: Self-pay

## 2020-12-15 ENCOUNTER — Ambulatory Visit: Payer: Medicaid Other

## 2020-12-15 DIAGNOSIS — A749 Chlamydial infection, unspecified: Secondary | ICD-10-CM

## 2020-12-15 MED ORDER — AZITHROMYCIN 500 MG PO TABS
1000.0000 mg | ORAL_TABLET | Freq: Once | ORAL | Status: AC
  Administered 2020-12-15: 1000 mg via ORAL

## 2020-12-15 NOTE — Progress Notes (Signed)
Consulted by RN re: patient requesting 1 dose treatment for Chlamydia.  Reviewed RN note and agree that it reflects our discussion and my recommendations.

## 2020-12-15 NOTE — Telephone Encounter (Signed)
TC to patient. Verified ID via password/SS#. Informed of positive chlamydia and need for tx. Instructed to eat before visit and have partner call for tx appt. Appt scheduled.  Patient reports not sexually active and is not on [facility] when discussing tx options. Richmond Campbell, RN

## 2020-12-15 NOTE — Progress Notes (Signed)
Here for chlamydia tx. Says partner is being treated today. Per pt, no bc method and last sex 09/2020.  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 RN carried out provider's orders and azithromycin crushed for pt. Advised to call [facility] for re treatment if vomits within 2 hrs. Question answered and reports understanding. Jerel Shepherd, RN

## 2020-12-23 ENCOUNTER — Encounter (HOSPITAL_COMMUNITY): Payer: Self-pay | Admitting: Emergency Medicine

## 2020-12-23 ENCOUNTER — Emergency Department (HOSPITAL_COMMUNITY)
Admission: EM | Admit: 2020-12-23 | Discharge: 2020-12-23 | Disposition: A | Discharge status: Home / Self Care | Payer: Medicaid Other | Attending: Emergency Medicine | Admitting: Emergency Medicine

## 2020-12-23 ENCOUNTER — Other Ambulatory Visit: Payer: Self-pay

## 2020-12-23 DIAGNOSIS — Z202 Contact with and (suspected) exposure to infections with a predominantly sexual mode of transmission: Secondary | ICD-10-CM | POA: Diagnosis not present

## 2020-12-23 DIAGNOSIS — J45909 Unspecified asthma, uncomplicated: Secondary | ICD-10-CM | POA: Insufficient documentation

## 2020-12-23 LAB — URINALYSIS, ROUTINE W REFLEX MICROSCOPIC
Bilirubin Urine: NEGATIVE
Glucose, UA: NEGATIVE mg/dL
Hgb urine dipstick: NEGATIVE
Ketones, ur: NEGATIVE mg/dL
Leukocytes,Ua: NEGATIVE
Nitrite: NEGATIVE
Protein, ur: NEGATIVE mg/dL
Specific Gravity, Urine: 1.028 (ref 1.005–1.030)
pH: 7 (ref 5.0–8.0)

## 2020-12-23 LAB — WET PREP, GENITAL
Clue Cells Wet Prep HPF POC: NONE SEEN
Sperm: NONE SEEN
Trich, Wet Prep: NONE SEEN
Yeast Wet Prep HPF POC: NONE SEEN

## 2020-12-23 LAB — PREGNANCY, URINE: Preg Test, Ur: NEGATIVE

## 2020-12-23 MED ORDER — LIDOCAINE HCL 1 % IJ SOLN
INTRAMUSCULAR | Status: AC
  Filled 2020-12-23: qty 20

## 2020-12-23 MED ORDER — DOXYCYCLINE HYCLATE 100 MG PO CAPS: 100.0000 mg | ORAL_CAPSULE | Freq: Two times a day (BID) | ORAL | 0 refills | Status: AC

## 2020-12-23 MED ORDER — CEFTRIAXONE SODIUM 1 G IJ SOLR
500.0000 mg | Freq: Once | INTRAMUSCULAR | Status: AC
  Administered 2020-12-23: 500 mg via INTRAMUSCULAR
  Filled 2020-12-23: qty 10

## 2020-12-23 NOTE — Discharge Instructions (Addendum)
Like we discussed, I am prescribing you an antibiotic called doxycycline.  Please take this twice a day for the next 7 days.  Do not stop taking this early.  Please avoid sexual intercourse for the next 7 to 14 days to help prevent further transmission.  All of your partners in the previous 60 days should be notified as well as tested.  Please follow-up with a women's health professional or the health department to be rescreened in 3 months.  You can always return to the emergency department with new or worsening symptoms.  It was a pleasure to meet you.

## 2020-12-23 NOTE — ED Provider Notes (Signed)
[facility]-EMERGENCY DEPT Provider Note   CSN: [id number] Arrival date & time: 12/23/20  1923     History Chief Complaint  Patient presents with  . Exposure to STD    Kathryn Nash is a 25 y.o. female.  HPI Patient is a 25 year old female with a medical history as noted below.  She states that she is sexually active with 1 female partner and he notified her today that he just tested positive for gonorrhea as well as chlamydia.  She noted to the nursing staff that she has been experiencing intermittent mild sharp pelvic pain but denies any symptoms to me.  Also denies any vaginal discharge.  No dysuria or hematuria.  States she has been sexually active with 1 female partner in the last 6 months.  States that they do not use protection.    Past Medical History:  Diagnosis Date  . Asthma     Patient Active Problem List   Diagnosis Date Noted  . Morbid obesity (HCC) 210 lbs 10/20/2019  . Asthma exacerbation 12/08/2018    Past Surgical History:  Procedure Laterality Date  . NO PAST SURGERIES       OB History    Gravida  2   Para      Term      Preterm      AB  1   Living        SAB  1   IAB      Ectopic      Multiple      Live Births              Family History  Problem Relation Age of Onset  . Asthma Mother   . Asthma Father     Social History   Tobacco Use  . Smoking status: Never Smoker  . Smokeless tobacco: Never Used  . Tobacco comment: Never used e-cigarettes  Vaping Use  . Vaping Use: Never used  Substance Use Topics  . Alcohol use: Not Currently    Comment: Last ETOH use 10/2020  . Drug use: Not Currently    Types: Marijuana    Comment: Last marijuana "years ago"    Home Medications Prior to Admission medications   Medication Sig Start Date End Date Taking? Authorizing Provider  doxycycline (VIBRAMYCIN) 100 MG capsule Take 1 capsule (100 mg total) by mouth 2 (two) times daily for 7 days. 12/23/20 12/30/20 Yes  Placido Sou, PA-C    Allergies    Eggs or egg-derived products, Iohexol, Montelukast sodium, and Penicillins  Review of Systems   Review of Systems  Genitourinary: Negative for decreased urine volume, difficulty urinating, dysuria, frequency, pelvic pain, urgency, vaginal bleeding, vaginal discharge and vaginal pain.   Physical Exam Updated Vital Signs BP 139/77 (BP Location: Right Arm)   Pulse 78   Temp 98.4 F (36.9 C) (Oral)   Resp 18   LMP 11/26/2020 (Exact Date)   SpO2 97%   Physical Exam Vitals and nursing note reviewed.  Constitutional:      General: She is not in acute distress.    Appearance: She is well-developed.  HENT:     Head: Normocephalic and atraumatic.     Right Ear: External ear normal.     Left Ear: External ear normal.  Eyes:     General: No scleral icterus.       Right eye: No discharge.        Left eye: No discharge.  Conjunctiva/sclera: Conjunctivae normal.  Neck:     Trachea: No tracheal deviation.  Cardiovascular:     Rate and Rhythm: Normal rate.  Pulmonary:     Effort: Pulmonary effort is normal. No respiratory distress.     Breath sounds: No stridor.  Abdominal:     General: There is no distension.  Genitourinary:    Comments: Female nursing chaperone present.  Normal-appearing vulvar anatomy.  Normal-appearing vaginal mucosa.  Small amount of thick white discharge noted in the vaginal vault.  No increased friability.  No erythema noted along the cervix.  Closed cervical os.  No cervical motion tenderness. Musculoskeletal:        General: No swelling or deformity.     Cervical back: Neck supple.  Skin:    General: Skin is warm and dry.     Findings: No rash.  Neurological:     Mental Status: She is alert.     Cranial Nerves: Cranial nerve deficit: no gross deficits.    ED Results / Procedures / Treatments   Labs (all labs ordered are listed, but only abnormal results are displayed) Labs Reviewed  WET PREP, GENITAL -  Abnormal; Notable for the following components:      Result Value   WBC, Wet Prep HPF POC MODERATE (*)    All other components within normal limits  URINALYSIS, ROUTINE W REFLEX MICROSCOPIC - Abnormal; Notable for the following components:   APPearance HAZY (*)    All other components within normal limits  PREGNANCY, URINE  GC/CHLAMYDIA PROBE AMP ([place]) NOT AT [facility]    EKG None  Radiology No results found.  Procedures Procedures (including critical care time)  Medications Ordered in ED Medications - No data to display  ED Course  I have reviewed the triage vital signs and the nursing notes.  Pertinent labs & imaging results that were available during my care of the patient were reviewed by me and considered in my medical decision making (see chart for details).    MDM Rules/Calculators/A&P                          Pt is a 25 y.o. female who presents the emergency department for STD testing.  Labs: UA hazy but otherwise negative. Pregnancy test negative. Wet prep shows moderate white blood cells. Gonorrhea/chlamydia pending.  I, Placido Sou, PA-C, personally reviewed and evaluated these lab results as part of my medical decision-making.  Patient presents today for evaluation and treatment for gonorrhea as well as chlamydia.  Patient has been sexually active with 1 female partner and states that he recently tested positive for both gonorrhea and chlamydia.  She denies any symptoms at this time.  Reassuring physical exam, including a pelvic exam.  Patient amenable to empiric treatment.  Will give IM Rocephin in the emergency department.  Will discharge on doxycycline.  Recommended checking the results of her gonorrhea/committee a test on MyChart.  Refrain from sex for 1 to 2 weeks.  Recommended any additional partners in the past 6 months be tested as well.  Follow-up at health department or with PCP for repeat testing in 3 months.  She can always return to the  emergency department with new or worsening symptoms.  Her questions were answered and she was amicable at the time of discharge.  Note: Portions of this report may have been transcribed using voice recognition software. Every effort was made to ensure accuracy; however, inadvertent computerized transcription errors  may be present.   Final Clinical Impression(s) / ED Diagnoses Final diagnoses:  Exposure to STD   Rx / DC Orders ED Discharge Orders         Ordered    doxycycline (VIBRAMYCIN) 100 MG capsule  2 times daily        12/23/20 2156           Placido Sou, PA-C 12/23/20 2202    Terald Sleeper, MD 12/23/20 2218

## 2020-12-23 NOTE — ED Triage Notes (Signed)
Pt reports that her partner informed her that he tested positive for gonorrhea and chlamydia yesterday. She reports intermittent, mild, sharp pelvic pain, but denies vaginal discharge or painful urination.

## 2020-12-26 LAB — GC/CHLAMYDIA PROBE AMP (~~LOC~~) NOT AT ARMC
Chlamydia: NEGATIVE
Comment: NEGATIVE
Comment: NORMAL
Neisseria Gonorrhea: NEGATIVE

## 2021-01-03 DIAGNOSIS — Z419 Encounter for procedure for purposes other than remedying health state, unspecified: Secondary | ICD-10-CM | POA: Diagnosis not present

## 2021-01-20 IMAGING — CT CT NECK WITH CONTRAST
4 of 5 series · 14 of 33 positions shown, 16 images · IV contrast (omnipaque)
Comparison: None.

CLINICAL DATA: Initial evaluation for acute sore throat, stridor.

EXAM:
CT NECK WITH CONTRAST
TECHNIQUE: Multidetector CT imaging of the neck was performed using the
standard protocol following the bolus administration of intravenous
contrast.
CONTRAST:  75mL OMNIPAQUE IOHEXOL 300 MG/ML  SOLN

[Series 2: axial neck · axial · 0.54mm/px · z∈[-306,-168]mm · 4 of 117 slices shown, 5 images]
[im 24/117  soft-tissue]
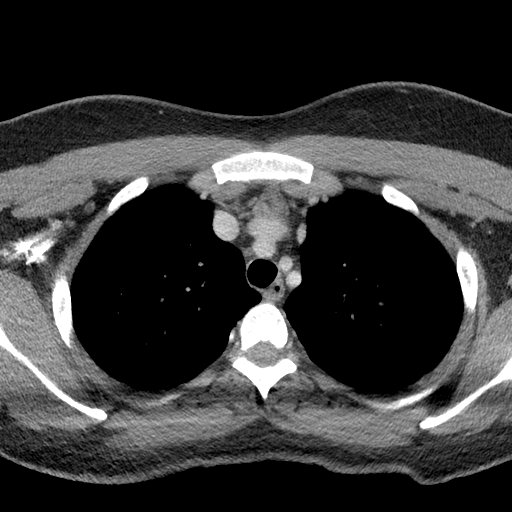
[im 24/117  bone]
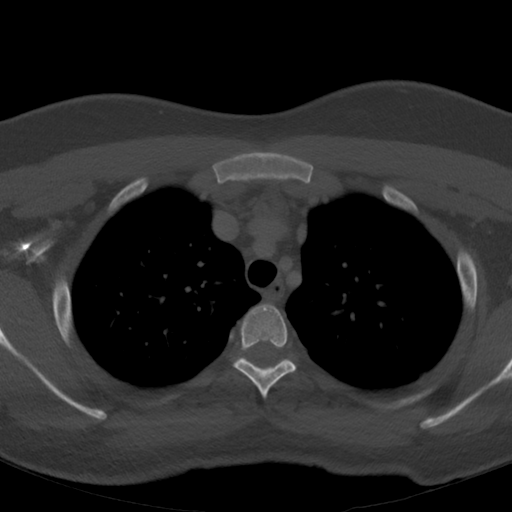
[im 47/117  bone]
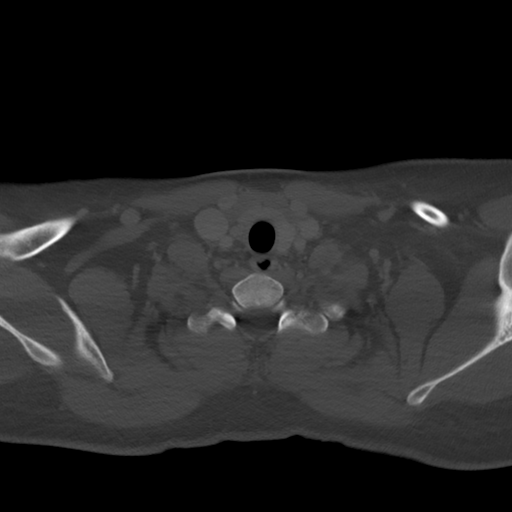
[im 70/117  bone]
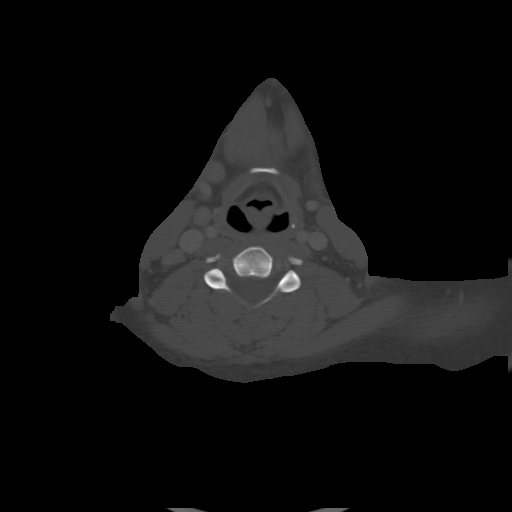
[im 93/117  bone]
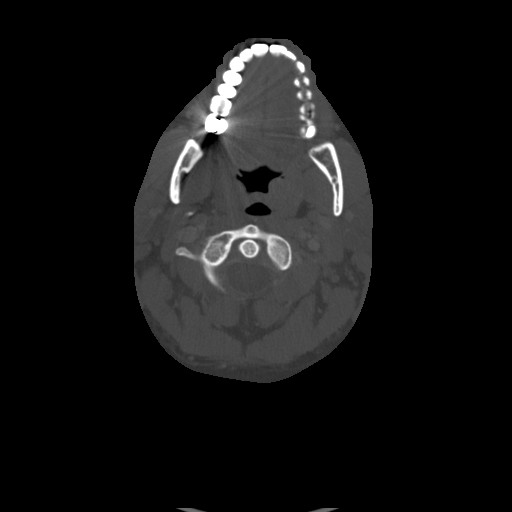

[Series 6: sag neck · sagittal · 0.46mm/px · 5 of 82 slices shown, 6 images]
[im 28/82  bone]
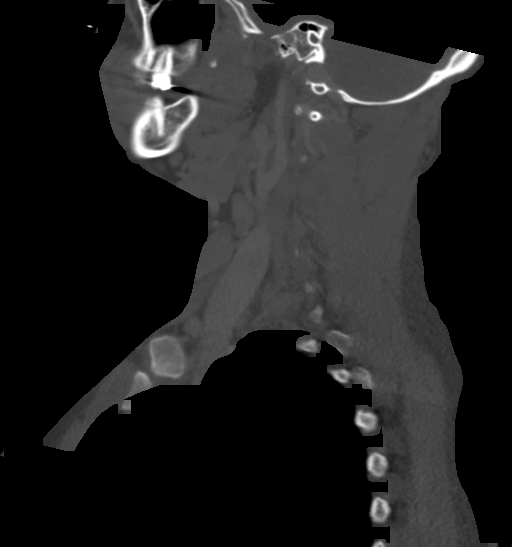
[im 34/82  bone]
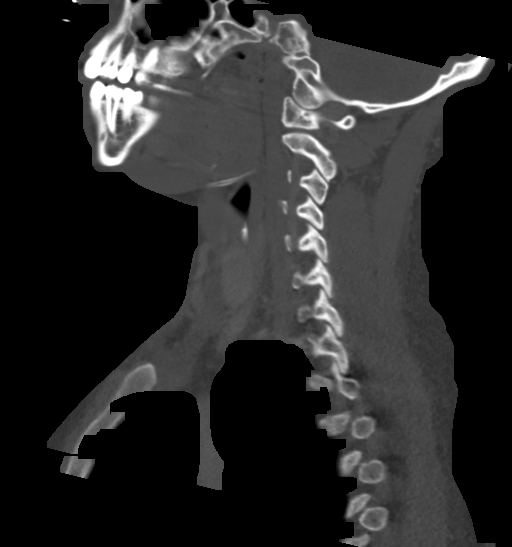
[im 41/82  soft-tissue]
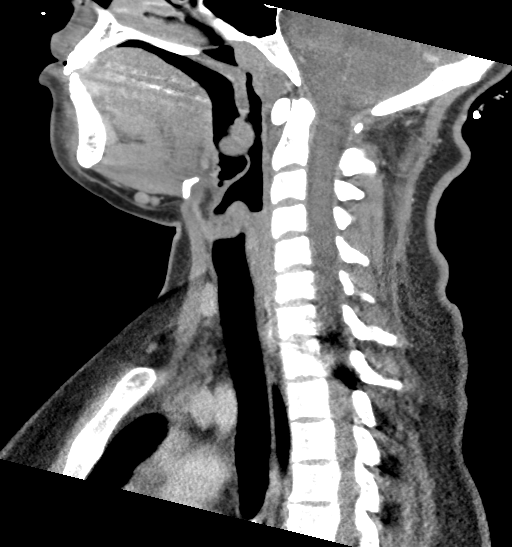
[im 41/82  bone]
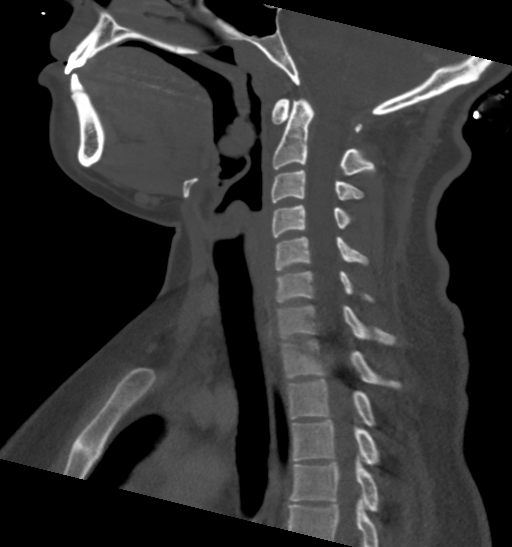
[im 48/82  bone]
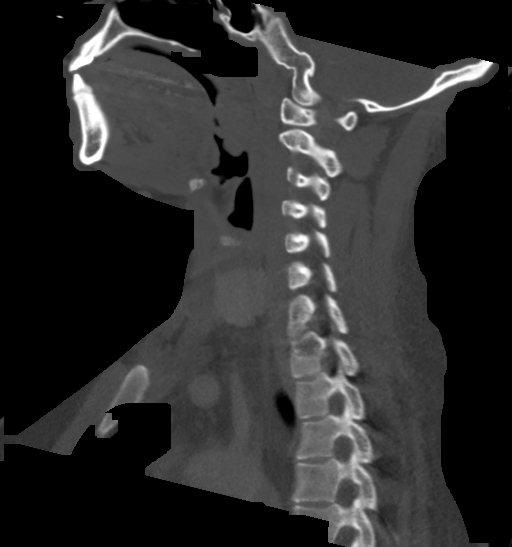
[im 55/82  bone]
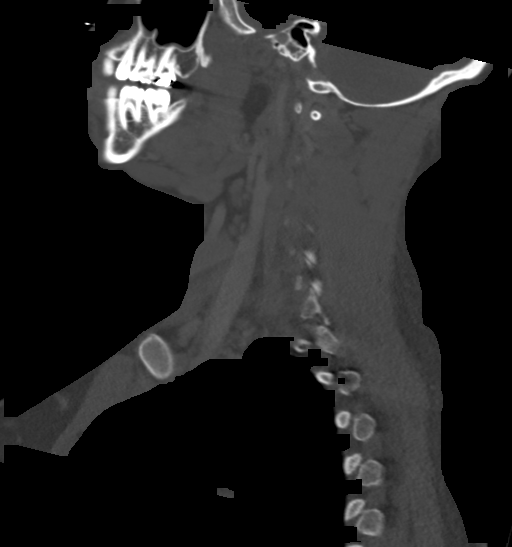

[Series 7: cor neck · coronal · 0.35mm/px · 3 of 94 slices shown]
[im 21/94  bone]
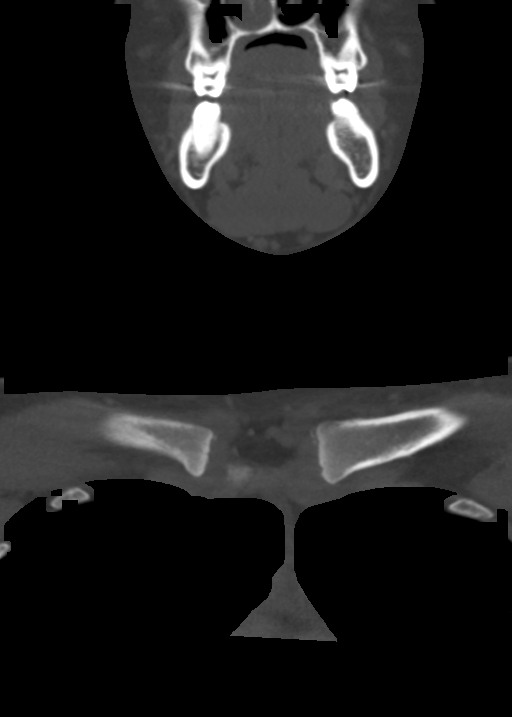
[im 38/94  bone]
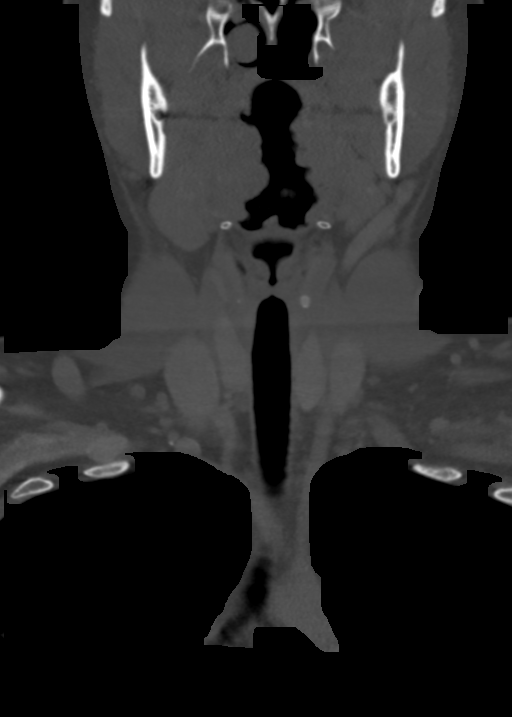
[im 56/94  bone]
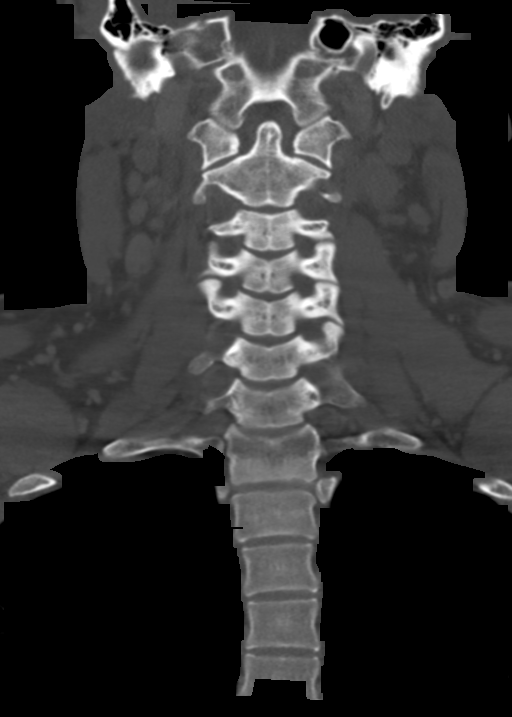

[Series 8: orthogonal ax · axial · 0.34mm/px · z∈[-336,-292]mm · 2 of 114 slices shown]
[im 23/114  bone]
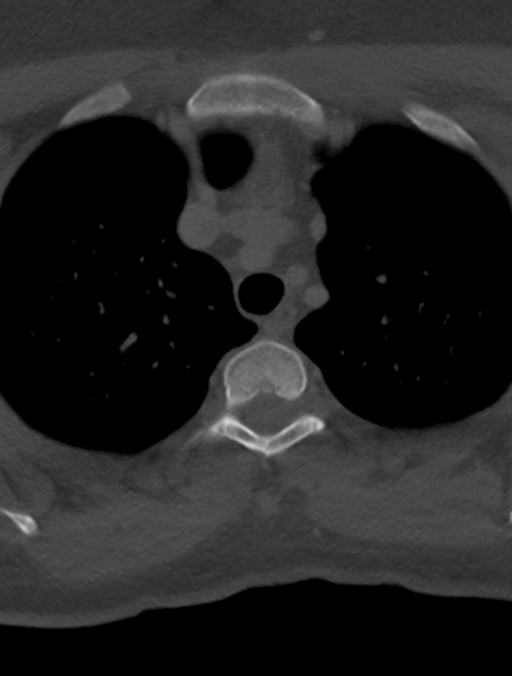
[im 46/114  bone]
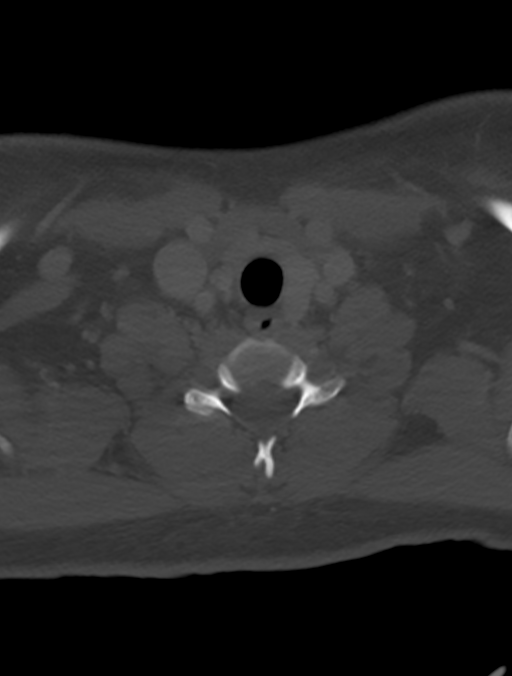

[14 of 33 positions shown; findings below may reference images not displayed]

FINDINGS: Pharynx and larynx: Oral cavity within normal limits. No acute
inflammatory changes seen about the dentition. Palatine tonsils are
enlarged and hyperenhancing bilaterally, right slightly greater than
left, suggesting acute tonsillitis. Superimposed 1 cm hypodensity at
the medial aspect of the right tonsil likely reflects phlegmon
and/or developing abscess. No frank drainable collections seen at
this time. Mild induration noted within the right parapharyngeal
fat. Nasopharynx within normal limits. No retropharyngeal
collection. Epiglottis normal without evidence for acute
epiglottitis. Vallecula clear. Remainder of the hypopharynx and
supraglottic larynx within normal limits. Glottis within normal
limits. Subglottic airway clear.

Salivary glands: Salivary glands including the parotid and
submandibular glands are normal.

Thyroid: Thyroid normal.

Lymph nodes: Enlarged bilateral level II lymph nodes measure up to
17 mm on the left and 14 mm on the right. Findings likely reactive.
Additional scattered subcentimeter shotty cervical lymph nodes noted
within the neck bilaterally with no other pathologically enlarged
nodes identified.

Vascular: Normal intravascular enhancement seen throughout the neck.

Limited intracranial: Unremarkable.

Visualized orbits: Not visualized on this exam.

Mastoids and visualized paranasal sinuses: Mild scattered mucosal
thickening within the ethmoidal air cells and maxillary sinuses.
Visualized paranasal sinuses are otherwise clear. Visualize mastoids
and middle ear cavities are well pneumatized and free of fluid.

Skeleton: No acute osseous finding. No discrete lytic or blastic
osseous lesions.

Upper chest: Visualized upper chest demonstrates no acute finding.
Normal residual thymic tissue noted within the anterior mediastinum.
Visualized lungs are clear.

Other: None.
IMPRESSION: 1. Diffuse enlargement of the palatine tonsils bilaterally, right
slightly worse than left, suggesting acute tonsillitis. Ill-defined
1 cm hypodensity within the right tonsil suggestive of phlegmon
and/or possible developing abscess. No frank drainable fluid
collection seen at this time.
2. Enlarged bilateral level II adenopathy as above, likely reactive.
3. No evidence for acute epiglottitis.

## 2021-01-31 DIAGNOSIS — Z419 Encounter for procedure for purposes other than remedying health state, unspecified: Secondary | ICD-10-CM | POA: Diagnosis not present

## 2021-02-08 ENCOUNTER — Emergency Department
Admission: EM | Admit: 2021-02-08 | Discharge: 2021-02-09 | Disposition: A | Discharge status: Home / Self Care | Payer: Medicaid Other | Attending: Student in an Organized Health Care Education/Training Program | Admitting: Student in an Organized Health Care Education/Training Program

## 2021-02-08 ENCOUNTER — Other Ambulatory Visit: Payer: Self-pay

## 2021-02-08 DIAGNOSIS — R300 Dysuria: Secondary | ICD-10-CM

## 2021-02-08 DIAGNOSIS — N898 Other specified noninflammatory disorders of vagina: Secondary | ICD-10-CM | POA: Insufficient documentation

## 2021-02-08 DIAGNOSIS — J45901 Unspecified asthma with (acute) exacerbation: Secondary | ICD-10-CM | POA: Diagnosis not present

## 2021-02-08 LAB — URINALYSIS, COMPLETE (UACMP) WITH MICROSCOPIC
Bacteria, UA: NONE SEEN
Bilirubin Urine: NEGATIVE
Glucose, UA: NEGATIVE mg/dL
Hgb urine dipstick: NEGATIVE
Ketones, ur: 20 mg/dL — AB
Leukocytes,Ua: NEGATIVE
Nitrite: NEGATIVE
Protein, ur: NEGATIVE mg/dL
Specific Gravity, Urine: 1.024 (ref 1.005–1.030)
pH: 5 (ref 5.0–8.0)

## 2021-02-08 NOTE — ED Provider Notes (Signed)
[facility] Emergency Department Provider Note    Event Date/Time   First MD Initiated Contact with Patient 02/08/21 2306     (approximate)  I have reviewed the triage vital signs and the nursing notes.   HISTORY  Chief Complaint Dysuria    HPI Kathryn Nash is a 25 y.o. female who presents to the ER for evaluation of dysuria as well as vaginal discharge x1 day.  Denies any abdominal pain nausea or vomiting.  No fevers.  No recent antibiotics.  No history of STI.  No flank pain.  States that she was shaving 2 days ago and cut her self in her groin area and thinks that might be worsened the discomfort is coming from.  Has not had any bleeding.   Past Medical History:  Diagnosis Date  . Asthma    Family History  Problem Relation Age of Onset  . Asthma Mother   . Asthma Father    Past Surgical History:  Procedure Laterality Date  . NO PAST SURGERIES     Patient Active Problem List   Diagnosis Date Noted  . Morbid obesity (HCC) 210 lbs 10/20/2019  . Asthma exacerbation 12/08/2018      Prior to Admission medications   Not on File    Allergies Eggs or egg-derived products, Iohexol, Montelukast sodium, and Penicillins    Social History Social History   Tobacco Use  . Smoking status: Never Smoker  . Smokeless tobacco: Never Used  . Tobacco comment: Never used e-cigarettes  Vaping Use  . Vaping Use: Never used  Substance Use Topics  . Alcohol use: Not Currently    Comment: Last ETOH use 10/2020  . Drug use: Not Currently    Types: Marijuana    Comment: Last marijuana "years ago"    Review of Systems Patient denies headaches, rhinorrhea, blurry vision, numbness, shortness of breath, chest pain, edema, cough, abdominal pain, nausea, vomiting, diarrhea, dysuria, fevers, rashes or hallucinations unless otherwise stated above in HPI. ____________________________________________   PHYSICAL EXAM:  VITAL SIGNS: Vitals:   02/09/21  0059  BP: (!) 124/59  Pulse: 70  Resp: 20  SpO2: 98%    Constitutional: Alert and oriented.  Eyes: Conjunctivae are normal.  Head: Atraumatic. Nose: No congestion/rhinnorhea. Mouth/Throat: Mucous membranes are moist.   Neck: No stridor. Painless ROM.  Cardiovascular: Normal rate, regular rhythm. Grossly normal heart sounds.  Good peripheral circulation. Respiratory: Normal respiratory effort.  No retractions. Lungs CTAB. Gastrointestinal: Soft and nontender. No distention. No abdominal bruits. No CVA tenderness. Genitourinary: Small half centimeter abrasion to the left labia with no underlying fluctuance or abscess.  No purulence or drainage.  No surrounding cellulitic changes.  No CMT, no discharge Musculoskeletal: No lower extremity tenderness nor edema.  No joint effusions. Neurologic:  Normal speech and language. No gross focal neurologic deficits are appreciated. No facial droop Skin:  Skin is warm, dry and intact. No rash noted. Psychiatric: Mood and affect are normal. Speech and behavior are normal.  ____________________________________________   LABS (all labs ordered are listed, but only abnormal results are displayed)  Results for orders placed or performed during the hospital encounter of 02/08/21 (from the past 24 hour(s))  Urinalysis, Complete w Microscopic Urine, Clean Catch     Status: Abnormal   Collection Time: 02/08/21  9:56 PM  Result Value Ref Range   Color, Urine YELLOW (A) YELLOW   APPearance HAZY (A) CLEAR   Specific Gravity, Urine 1.024 1.005 - 1.030  pH 5.0 5.0 - 8.0   Glucose, UA NEGATIVE NEGATIVE mg/dL   Hgb urine dipstick NEGATIVE NEGATIVE   Bilirubin Urine NEGATIVE NEGATIVE   Ketones, ur 20 (A) NEGATIVE mg/dL   Protein, ur NEGATIVE NEGATIVE mg/dL   Nitrite NEGATIVE NEGATIVE   Leukocytes,Ua NEGATIVE NEGATIVE   RBC / HPF 0-5 0 - 5 RBC/hpf   WBC, UA 0-5 0 - 5 WBC/hpf   Bacteria, UA NONE SEEN NONE SEEN   Squamous Epithelial / LPF 6-10 0 - 5    Mucus PRESENT   Pregnancy, urine     Status: None   Collection Time: 02/08/21  9:56 PM  Result Value Ref Range   Preg Test, Ur NEGATIVE NEGATIVE  Wet prep, genital     Status: Abnormal   Collection Time: 02/09/21 12:16 AM   Specimen: Vaginal  Result Value Ref Range   Yeast Wet Prep HPF POC NONE SEEN NONE SEEN   Trich, Wet Prep NONE SEEN NONE SEEN   Clue Cells Wet Prep HPF POC NONE SEEN NONE SEEN   WBC, Wet Prep HPF POC RARE (A) NONE SEEN   Sperm NONE SEEN    ____________________________________________ ____________________________________________  RADIOLOGY   ____________________________________________   PROCEDURES  Procedure(s) performed:  Procedures    Critical Care performed: no ____________________________________________   INITIAL IMPRESSION / ASSESSMENT AND PLAN / ED COURSE  Pertinent labs & imaging results that were available during my care of the patient were reviewed by me and considered in my medical decision making (see chart for details).   DDX: Cystitis, abrasion, PID, cervicitis, herpes  Kathryn Nash is a 25 y.o. who presents to the ED with presentation as described above.  Patient well-appearing with benign abdominal exam.  Urinalysis is normal she not pregnant.  Pelvic exam performed no sign of cervical motion tenderness.  Do not feel that additional imaging clinically indicated based on her presentation.  I think she is mostly having some irritation and discomfort related to what appears to be a well-healing abrasion from recent shaving.  We discussed conservative management and discussed strict return precautions. patient appropriate for outpatient follow-up.  Clinical Course as of 02/09/21 0149  Thu Feb 09, 2021  0144 pH: 5.0 [PR]    Clinical Course User Index [PR] Willy Eddy, MD    The patient was evaluated in Emergency Department today for the symptoms described in the history of present illness. He/she was evaluated in the context of  the global COVID-19 pandemic, which necessitated consideration that the patient might be at risk for infection with the SARS-CoV-2 virus that causes COVID-19. Institutional protocols and algorithms that pertain to the evaluation of patients at risk for COVID-19 are in a state of rapid change based on information released by regulatory bodies including the CDC and federal and state organizations. These policies and algorithms were followed during the patient's care in the ED.  As part of my medical decision making, I reviewed the following data within the electronic MEDICAL RECORD NUMBER Nursing notes reviewed and incorporated, Labs reviewed, notes from prior ED visits and [place] Controlled Substance Database   ____________________________________________   FINAL CLINICAL IMPRESSION(S) / ED DIAGNOSES  Final diagnoses:  Dysuria      NEW MEDICATIONS STARTED DURING THIS VISIT:  New Prescriptions   No medications on file     Note:  This document was prepared using Dragon voice recognition software and may include unintentional dictation errors.    Willy Eddy, MD 02/09/21 [phone]

## 2021-02-08 NOTE — ED Triage Notes (Signed)
Pt has dysuria since yesterday.  No vag bleeding.  Pt has lower back pain .  Pt alert  Speech clear

## 2021-02-08 NOTE — ED Provider Notes (Incomplete)
[facility] Emergency Department Provider Note    Event Date/Time   First MD Initiated Contact with Patient 02/08/21 2306     (approximate)  I have reviewed the triage vital signs and the nursing notes.   HISTORY  Chief Complaint Dysuria    HPI Kathryn Nash is a 25 y.o. female who presents to the ER for evaluation of dysuria as well as vaginal discharge x1 day.  Denies any abdominal pain nausea or vomiting.  No fevers.  No recent antibiotics.  No history of STI.  No flank pain.   Past Medical History:  Diagnosis Date  . Asthma    Family History  Problem Relation Age of Onset  . Asthma Mother   . Asthma Father    Past Surgical History:  Procedure Laterality Date  . NO PAST SURGERIES     Patient Active Problem List   Diagnosis Date Noted  . Morbid obesity (HCC) 210 lbs 10/20/2019  . Asthma exacerbation 12/08/2018      Prior to Admission medications   Not on File    Allergies Eggs or egg-derived products, Iohexol, Montelukast sodium, and Penicillins    Social History Social History   Tobacco Use  . Smoking status: Never Smoker  . Smokeless tobacco: Never Used  . Tobacco comment: Never used e-cigarettes  Vaping Use  . Vaping Use: Never used  Substance Use Topics  . Alcohol use: Not Currently    Comment: Last ETOH use 10/2020  . Drug use: Not Currently    Types: Marijuana    Comment: Last marijuana "years ago"    Review of Systems Patient denies headaches, rhinorrhea, blurry vision, numbness, shortness of breath, chest pain, edema, cough, abdominal pain, nausea, vomiting, diarrhea, dysuria, fevers, rashes or hallucinations unless otherwise stated above in HPI. ____________________________________________   PHYSICAL EXAM:  VITAL SIGNS: There were no vitals filed for this visit. *** Constitutional: Alert and oriented.  Eyes: Conjunctivae are normal.  Head: Atraumatic. Nose: No congestion/rhinnorhea. Mouth/Throat:  Mucous membranes are moist.   Neck: No stridor. Painless ROM.  Cardiovascular: Normal rate, regular rhythm. Grossly normal heart sounds.  Good peripheral circulation. Respiratory: Normal respiratory effort.  No retractions. Lungs CTAB. Gastrointestinal: Soft and nontender. No distention. No abdominal bruits. No CVA tenderness. Genitourinary: *** Musculoskeletal: No lower extremity tenderness nor edema.  No joint effusions. Neurologic:  Normal speech and language. No gross focal neurologic deficits are appreciated. No facial droop Skin:  Skin is warm, dry and intact. No rash noted. Psychiatric: Mood and affect are normal. Speech and behavior are normal. *** ____________________________________________   LABS (all labs ordered are listed, but only abnormal results are displayed)  Results for orders placed or performed during the hospital encounter of 02/08/21 (from the past 24 hour(s))  Urinalysis, Complete w Microscopic Urine, Clean Catch     Status: Abnormal   Collection Time: 02/08/21  9:56 PM  Result Value Ref Range   Color, Urine YELLOW (A) YELLOW   APPearance HAZY (A) CLEAR   Specific Gravity, Urine 1.024 1.005 - 1.030   pH 5.0 5.0 - 8.0   Glucose, UA NEGATIVE NEGATIVE mg/dL   Hgb urine dipstick NEGATIVE NEGATIVE   Bilirubin Urine NEGATIVE NEGATIVE   Ketones, ur 20 (A) NEGATIVE mg/dL   Protein, ur NEGATIVE NEGATIVE mg/dL   Nitrite NEGATIVE NEGATIVE   Leukocytes,Ua NEGATIVE NEGATIVE   RBC / HPF 0-5 0 - 5 RBC/hpf   WBC, UA 0-5 0 - 5 WBC/hpf   Bacteria, UA  NONE SEEN NONE SEEN   Squamous Epithelial / LPF 6-10 0 - 5   Mucus PRESENT    ____________________________________________  EKG My review and personal interpretation at Time: ***   Indication: ***  Rate: ***  Rhythm: *** Axis: *** Other: *** ____________________________________________  RADIOLOGY  *** ____________________________________________   PROCEDURES  Procedure(s) performed:  Procedures    Critical  Care performed: ***no ____________________________________________   INITIAL IMPRESSION / ASSESSMENT AND PLAN / ED COURSE  Pertinent labs & imaging results that were available during my care of the patient were reviewed by me and considered in my medical decision making (see chart for details).   DDX: ***  DAI APEL is a 25 y.o. who presents to the ED with ***     The patient was evaluated in Emergency Department today for the symptoms described in the history of present illness. He/she was evaluated in the context of the global COVID-19 pandemic, which necessitated consideration that the patient might be at risk for infection with the SARS-CoV-2 virus that causes COVID-19. Institutional protocols and algorithms that pertain to the evaluation of patients at risk for COVID-19 are in a state of rapid change based on information released by regulatory bodies including the CDC and federal and state organizations. These policies and algorithms were followed during the patient's care in the ED.  As part of my medical decision making, I reviewed the following data within the electronic MEDICAL RECORD NUMBER Nursing notes reviewed and incorporated, Labs reviewed, notes from prior ED visits and [place] Controlled Substance Database   ____________________________________________   FINAL CLINICAL IMPRESSION(S) / ED DIAGNOSES  Final diagnoses:  None      NEW MEDICATIONS STARTED DURING THIS VISIT:  New Prescriptions   No medications on file     Note:  This document was prepared using Dragon voice recognition software and may include unintentional dictation errors.

## 2021-02-09 LAB — WET PREP, GENITAL
Clue Cells Wet Prep HPF POC: NONE SEEN
Sperm: NONE SEEN
Trich, Wet Prep: NONE SEEN
Yeast Wet Prep HPF POC: NONE SEEN

## 2021-02-09 LAB — CHLAMYDIA/NGC RT PCR (ARMC ONLY)
Chlamydia Tr: NOT DETECTED
N gonorrhoeae: NOT DETECTED

## 2021-02-09 LAB — PREGNANCY, URINE: Preg Test, Ur: NEGATIVE

## 2021-02-09 MED ORDER — ACETAMINOPHEN 500 MG PO TABS
1000.0000 mg | ORAL_TABLET | Freq: Once | ORAL | Status: AC
  Administered 2021-02-09: 1000 mg via ORAL
  Filled 2021-02-09: qty 2

## 2021-02-13 ENCOUNTER — Ambulatory Visit: Payer: Medicaid Other

## 2021-02-27 ENCOUNTER — Other Ambulatory Visit: Payer: Medicaid Other

## 2021-03-03 ENCOUNTER — Other Ambulatory Visit: Payer: Medicaid Other

## 2021-03-03 DIAGNOSIS — Z419 Encounter for procedure for purposes other than remedying health state, unspecified: Secondary | ICD-10-CM | POA: Diagnosis not present

## 2021-04-02 DIAGNOSIS — Z419 Encounter for procedure for purposes other than remedying health state, unspecified: Secondary | ICD-10-CM | POA: Diagnosis not present

## 2021-04-12 ENCOUNTER — Ambulatory Visit: Payer: Medicaid Other

## 2021-04-17 ENCOUNTER — Encounter: Payer: Self-pay | Admitting: Advanced Practice Midwife

## 2021-04-17 ENCOUNTER — Ambulatory Visit: Payer: Medicaid Other | Admitting: Advanced Practice Midwife

## 2021-04-17 ENCOUNTER — Other Ambulatory Visit: Payer: Self-pay

## 2021-04-17 DIAGNOSIS — Z113 Encounter for screening for infections with a predominantly sexual mode of transmission: Secondary | ICD-10-CM

## 2021-04-17 LAB — WET PREP FOR TRICH, YEAST, CLUE
Trichomonas Exam: NEGATIVE
Yeast Exam: NEGATIVE

## 2021-04-17 NOTE — Progress Notes (Signed)
[facility] Department STI clinic/screening visit  Subjective:  Kathryn Nash is a 25 y.o.SBF G2P0 nonsmoker female being seen today for an STI screening visit. The patient reports they do not have symptoms.  Patient reports that they do not desire a pregnancy in the next year.   They reported they are not interested in discussing contraception today.  Patient's last menstrual period was 09/12/2020.   Patient has the following medical conditions:   Patient Active Problem List   Diagnosis Date Noted  . Morbid obesity (HCC) 216 lbs 10/20/2019  . Asthma exacerbation 12/08/2018    Chief Complaint  Patient presents with  . SEXUALLY TRANSMITTED DISEASE    STD screening except bloodwork - TOC    HPI  Patient reports asmymptomatic. Refuses pharyngeal culture. LMP 03/23/21. Last sex yesterday without condom; with current partner x 3 mo; 1 partner in last 3 mo.  Last ETOH 12/2019 (1 shot liquor) rarely.  Last HIV test per patient/review of record was 10/14/20 Patient reports last pap was doesn't remember  See flowsheet for further details and programmatic requirements.    The following portions of the patient's history were reviewed and updated as appropriate: allergies, current medications, past medical history, past social history, past surgical history and problem list.  Objective:  There were no vitals filed for this visit.  Physical Exam Vitals and nursing note reviewed.  Constitutional:      Appearance: Normal appearance. She is obese.  HENT:     Head: Normocephalic and atraumatic.     Mouth/Throat:     Mouth: Mucous membranes are moist.     Pharynx: Oropharynx is clear. No oropharyngeal exudate or posterior oropharyngeal erythema.  Eyes:     Conjunctiva/sclera: Conjunctivae normal.  Pulmonary:     Effort: Pulmonary effort is normal.  Chest:  Breasts:     Right: No axillary adenopathy or supraclavicular adenopathy.     Left: No axillary adenopathy or  supraclavicular adenopathy.    Abdominal:     Palpations: Abdomen is soft. There is no mass.     Tenderness: There is no abdominal tenderness. There is no rebound.     Comments: Soft, poor tone, increased adipose,without masses or tenderness  Genitourinary:    General: Normal vulva.     Exam position: Lithotomy position.     Pubic Area: No rash or pubic lice.      Labia:        Right: No rash or lesion.        Left: No rash or lesion.      Vagina: Normal. No vaginal discharge, erythema, bleeding or lesions.     Cervix: Normal.     Uterus: Normal.      Adnexa: Right adnexa normal and left adnexa normal.     Rectum: Normal.     Comments: Small amt white creamy leukorrhea, ph<4.5 Lymphadenopathy:     Head:     Right side of head: No preauricular or posterior auricular adenopathy.     Left side of head: No preauricular or posterior auricular adenopathy.     Cervical: No cervical adenopathy.     Upper Body:     Right upper body: No supraclavicular or axillary adenopathy.     Left upper body: No supraclavicular or axillary adenopathy.     Lower Body: No right inguinal adenopathy. No left inguinal adenopathy.  Skin:    General: Skin is warm and dry.     Findings: No rash.  Neurological:  Mental Status: She is alert and oriented to person, place, and time.      Assessment and Plan:  Kathryn Nash is a 25 y.o. female presenting to the [facility] Department for STI screening  1. Screening examination for venereal disease Treat wet mount per standing orders Immunization nurse consult - WET PREP FOR TRICH, YEAST, CLUE - Chlamydia/Gonorrhea  Lab     No follow-ups on file.  No future appointments.  Alberteen Spindle, CNM

## 2021-04-17 NOTE — Progress Notes (Signed)
Wet mount reviewed, no tx per standing order. Provider orders completed. 

## 2021-04-21 ENCOUNTER — Telehealth: Payer: Self-pay

## 2021-04-21 NOTE — Telephone Encounter (Signed)
Pt returned call. Advised of positive test results and recommended treatment. Pt has appt on 5/23 for TB test so added on sti treatment to existing appt.advised pt to eat prior to appt.

## 2021-04-21 NOTE — Telephone Encounter (Signed)
Attempted to contact pt to advise of positive test results gonorrhea. No answer and voicemail was not setup. Unable to leave a message.

## 2021-04-24 ENCOUNTER — Other Ambulatory Visit: Payer: Self-pay

## 2021-04-24 ENCOUNTER — Ambulatory Visit: Payer: Medicaid Other

## 2021-04-24 ENCOUNTER — Ambulatory Visit (LOCAL_COMMUNITY_HEALTH_CENTER): Payer: Medicaid Other

## 2021-04-24 DIAGNOSIS — Z113 Encounter for screening for infections with a predominantly sexual mode of transmission: Secondary | ICD-10-CM

## 2021-04-24 DIAGNOSIS — A549 Gonococcal infection, unspecified: Secondary | ICD-10-CM

## 2021-04-24 DIAGNOSIS — Z111 Encounter for screening for respiratory tuberculosis: Secondary | ICD-10-CM

## 2021-04-24 MED ORDER — CEFTRIAXONE SODIUM 500 MG IJ SOLR
500.0000 mg | Freq: Once | INTRAMUSCULAR | Status: AC
  Administered 2021-04-24: 500 mg via INTRAMUSCULAR

## 2021-04-24 NOTE — Progress Notes (Signed)
Here for gonorrhea treatment. Pt states she has had penicillin before without problems. States mother had listed PCN as allergy but she denies any problem in taking PCN. She knows of no previous reaction to PCN. Upon Epic review, pt had Ceftriaxone in  2020 and pt denies any reaction. Consult with Beatris Si, PA who advises to continue with Ceftriaxone per [facility] Dr. Alvester Morin for gonorrhea tx. Advises RN to counsel pt to stay for 20-30 min observation after injection. RN discussed ceftriaxone treatment today and pt agrees. Ceftriaxone administered today without difficulty. Pt stayed for 30 min observation without incident. Advised to call [facility] if any prob and to seek immediate medical attn if SOB, facial, tongue swelling, chest pain. Pt in agreement. Jerel Shepherd, RN

## 2021-04-26 NOTE — Progress Notes (Signed)
Consulted by RN re: patient situation.  Reviewed RN note and agree that it reflects our discussion and my recommendations. 

## 2021-04-27 ENCOUNTER — Other Ambulatory Visit: Payer: Self-pay

## 2021-04-27 ENCOUNTER — Ambulatory Visit (LOCAL_COMMUNITY_HEALTH_CENTER): Payer: Medicaid Other

## 2021-04-27 DIAGNOSIS — Z111 Encounter for screening for respiratory tuberculosis: Secondary | ICD-10-CM

## 2021-04-27 LAB — TB SKIN TEST
Induration: 0 mm
TB Skin Test: NEGATIVE

## 2021-04-30 ENCOUNTER — Encounter: Payer: Self-pay | Admitting: Emergency Medicine

## 2021-04-30 ENCOUNTER — Emergency Department
Admission: EM | Admit: 2021-04-30 | Discharge: 2021-04-30 | Disposition: A | Discharge status: Home / Self Care | Payer: Medicaid Other | Attending: Emergency Medicine | Admitting: Emergency Medicine

## 2021-04-30 ENCOUNTER — Other Ambulatory Visit: Payer: Self-pay

## 2021-04-30 DIAGNOSIS — Z3202 Encounter for pregnancy test, result negative: Secondary | ICD-10-CM | POA: Diagnosis not present

## 2021-04-30 DIAGNOSIS — R109 Unspecified abdominal pain: Secondary | ICD-10-CM | POA: Insufficient documentation

## 2021-04-30 DIAGNOSIS — J45901 Unspecified asthma with (acute) exacerbation: Secondary | ICD-10-CM | POA: Insufficient documentation

## 2021-04-30 LAB — POC URINE PREG, ED: Preg Test, Ur: NEGATIVE

## 2021-04-30 NOTE — ED Triage Notes (Signed)
Pt reports also that her mom told her we could do a blood test if the urine pregnancy came back negative. I explained that the urine pregnancy test is very accurate. She voiced understanding.

## 2021-04-30 NOTE — ED Provider Notes (Signed)
[facility]-EMERGENCY DEPARTMENT  ____________________________________________  Time seen: Approximately 8:16 PM  I have reviewed the triage vital signs and the nursing notes.   HISTORY  Chief Complaint Abdominal Pain   Historian Patient     HPI Kathryn Nash is a 25 y.o. female presents to the emergency department.  Patient states that her cycle is late and she has had some intermittent cramping.  Patient would like marron pregnancy test.  She has no other medical concerns.   Past Medical History:  Diagnosis Date  . Asthma      Immunizations up to date:  Yes.     Past Medical History:  Diagnosis Date  . Asthma     Patient Active Problem List   Diagnosis Date Noted  . Morbid obesity (HCC) 216 lbs 10/20/2019  . Asthma exacerbation 12/08/2018    Past Surgical History:  Procedure Laterality Date  . NO PAST SURGERIES      Prior to Admission medications   Not on File    Allergies Eggs or egg-derived products, Iohexol, Montelukast sodium, and Penicillins  Family History  Problem Relation Age of Onset  . Asthma Mother   . Asthma Father     Social History Social History   Tobacco Use  . Smoking status: Never Smoker  . Smokeless tobacco: Never Used  . Tobacco comment: Never used e-cigarettes  Vaping Use  . Vaping Use: Never used  Substance Use Topics  . Alcohol use: Yes    Alcohol/week: 1.0 standard drink    Types: 1 Shots of liquor per week    Comment: Last ETOH use 10/2020  . Drug use: Not Currently    Types: Marijuana    Comment: Last marijuana "years ago"     Review of Systems  Constitutional: No fever/chills Eyes:  No discharge ENT: No upper respiratory complaints. Respiratory: no cough. No SOB/ use of accessory muscles to breath Gastrointestinal:   No nausea, no vomiting.  No diarrhea.  No constipation. Musculoskeletal: Negative for musculoskeletal pain. Skin: Negative for rash, abrasions, lacerations,  ecchymosis.    ____________________________________________   PHYSICAL EXAM:  VITAL SIGNS: ED Triage Vitals  Enc Vitals Group     BP 04/30/21 1939 (!) 144/73     Pulse Rate 04/30/21 1939 72     Resp 04/30/21 1939 20     Temp 04/30/21 1939 98.6 F (37 C)     Temp Source 04/30/21 1939 Oral     SpO2 04/30/21 1939 98 %     Weight 04/30/21 1940 220 lb (99.8 kg)     Height 04/30/21 1940 5\' 6"  (1.676 m)     Head Circumference --      Peak Flow --      Pain Score 04/30/21 1940 4     Pain Loc --      Pain Edu? --      Excl. in GC? --      Constitutional: Alert and oriented. Well appearing and in no acute distress. Eyes: Conjunctivae are normal. PERRL. EOMI. Head: Atraumatic. ENT:      Nose: No congestion/rhinnorhea.      Mouth/Throat: Mucous membranes are moist.  Neck: No stridor.  No cervical spine tenderness to palpation. Cardiovascular: Normal rate, regular rhythm. Normal S1 and S2.  Good peripheral circulation. Respiratory: Normal respiratory effort without tachypnea or retractions. Lungs CTAB. Good air entry to the bases with no decreased or absent breath sounds Gastrointestinal: Bowel sounds x 4 quadrants. Soft and nontender to palpation. No guarding or  rigidity. No distention. Musculoskeletal: Full range of motion to all extremities. No obvious deformities noted Neurologic:  Normal for age. No gross focal neurologic deficits are appreciated.  Skin:  Skin is warm, dry and intact. No rash noted. Psychiatric: Mood and affect are normal for age. Speech and behavior are normal.   ____________________________________________   LABS (all labs ordered are listed, but only abnormal results are displayed)  Labs Reviewed  POC URINE PREG, ED   ____________________________________________  EKG   ____________________________________________  RADIOLOGY   No results found.  ____________________________________________    PROCEDURES  Procedure(s) performed:      Procedures     Medications - No data to display   ____________________________________________   INITIAL IMPRESSION / ASSESSMENT AND PLAN / ED COURSE  Pertinent labs & imaging results that were available during my care of the patient were reviewed by me and considered in my medical decision making (see chart for details).      Assessment and plan Negative pregnancy test 25 year old female presents to the emergency department for a urine pregnancy test which was negative.  Offered injection of Toradol for pelvic cramping and patient declined stating that her cramps come and go.  Return precautions were given to return with new or worsening symptoms.     ____________________________________________  FINAL CLINICAL IMPRESSION(S) / ED DIAGNOSES  Final diagnoses:  Negative pregnancy test      NEW MEDICATIONS STARTED DURING THIS VISIT:  ED Discharge Orders    None          This chart was dictated using voice recognition software/Dragon. Despite best efforts to proofread, errors can occur which can change the meaning. Any change was purely unintentional.     Orvil Feil, PA-C 04/30/21 2018    Minna Antis, MD 04/30/21 [phone]

## 2021-04-30 NOTE — ED Triage Notes (Signed)
Pt reports that she has been having lower abd cramping for the last week. She is late on her period and keeps taking pregnancy test and they are coming back negative.

## 2021-05-02 ENCOUNTER — Emergency Department
Admission: EM | Admit: 2021-05-02 | Discharge: 2021-05-02 | Disposition: A | Discharge status: Home / Self Care | Payer: Medicaid Other | Attending: Emergency Medicine | Admitting: Emergency Medicine

## 2021-05-02 ENCOUNTER — Encounter: Payer: Self-pay | Admitting: Emergency Medicine

## 2021-05-02 DIAGNOSIS — J45909 Unspecified asthma, uncomplicated: Secondary | ICD-10-CM | POA: Diagnosis not present

## 2021-05-02 DIAGNOSIS — Z202 Contact with and (suspected) exposure to infections with a predominantly sexual mode of transmission: Secondary | ICD-10-CM | POA: Diagnosis not present

## 2021-05-02 DIAGNOSIS — A599 Trichomoniasis, unspecified: Secondary | ICD-10-CM | POA: Insufficient documentation

## 2021-05-02 DIAGNOSIS — Z7689 Persons encountering health services in other specified circumstances: Secondary | ICD-10-CM

## 2021-05-02 LAB — URINALYSIS, COMPLETE (UACMP) WITH MICROSCOPIC
Bilirubin Urine: NEGATIVE
Glucose, UA: NEGATIVE mg/dL
Hgb urine dipstick: NEGATIVE
Ketones, ur: NEGATIVE mg/dL
Nitrite: NEGATIVE
Protein, ur: NEGATIVE mg/dL
Specific Gravity, Urine: 1.027 (ref 1.005–1.030)
pH: 7 (ref 5.0–8.0)

## 2021-05-02 LAB — WET PREP, GENITAL
Clue Cells Wet Prep HPF POC: NONE SEEN
Sperm: NONE SEEN
Yeast Wet Prep HPF POC: NONE SEEN

## 2021-05-02 LAB — POC URINE PREG, ED: Preg Test, Ur: NEGATIVE

## 2021-05-02 MED ORDER — AZITHROMYCIN 1 G PO PACK
1.0000 g | PACK | Freq: Once | ORAL | Status: AC
  Administered 2021-05-02: 1 g via ORAL
  Filled 2021-05-02: qty 1

## 2021-05-02 MED ORDER — METRONIDAZOLE 500 MG PO TABS: 500.0000 mg | ORAL_TABLET | Freq: Two times a day (BID) | ORAL | 0 refills | Status: AC

## 2021-05-02 MED ORDER — LIDOCAINE HCL (PF) 1 % IJ SOLN
2.0000 mL | Freq: Once | INTRAMUSCULAR | Status: AC
  Administered 2021-05-02: 2 mL via INTRADERMAL
  Filled 2021-05-02: qty 5

## 2021-05-02 MED ORDER — METRONIDAZOLE 500 MG PO TABS
500.0000 mg | ORAL_TABLET | Freq: Once | ORAL | Status: AC
  Administered 2021-05-02: 500 mg via ORAL
  Filled 2021-05-02: qty 1

## 2021-05-02 MED ORDER — DOXYCYCLINE HYCLATE 100 MG PO TABS: 100.0000 mg | ORAL_TABLET | Freq: Two times a day (BID) | ORAL | 0 refills | Status: DC

## 2021-05-02 MED ORDER — CEFTRIAXONE SODIUM 1 G IJ SOLR
500.0000 mg | Freq: Once | INTRAMUSCULAR | Status: AC
  Administered 2021-05-02: 500 mg via INTRAMUSCULAR
  Filled 2021-05-02: qty 10

## 2021-05-02 NOTE — ED Provider Notes (Signed)
[facility] Emergency Department Provider Note ____________________________________________  Time seen: 2021  I have reviewed the triage vital signs and the nursing notes.  HISTORY  Chief Complaint  SEXUALLY TRANSMITTED DISEASE  HPI Kathryn Nash is a 25 y.o. female presents herself to the ED for treatment regarding a gonorrhea confirmation.  She reports she was told to report to the ED for treatment as to have department was unable to get her in in a timely fashion.  Chart review, reveals a visit on 5/23, with the patient was given appropriate dose of ceftriaxone IM for her gonorrhea.  She does admit however, to indulging in sexual contact with her boyfriend, prior to he was appropriately treated for gonorrhea.  She denies any current symptoms, but notes he is reporting ongoing symptoms at this time.  Patient additionally complains of cloudy urine and some low back discomfort.   Past Medical History:  Diagnosis Date  . Asthma     Patient Active Problem List   Diagnosis Date Noted  . Morbid obesity (HCC) 216 lbs 10/20/2019  . Asthma exacerbation 12/08/2018    Past Surgical History:  Procedure Laterality Date  . NO PAST SURGERIES      Prior to Admission medications   Medication Sig Start Date End Date Taking? Authorizing Provider  doxycycline (VIBRA-TABS) 100 MG tablet Take 1 tablet (100 mg total) by mouth 2 (two) times daily. 05/02/21  Yes Zayne Draheim, Charlesetta Ivory, PA-C  metroNIDAZOLE (FLAGYL) 500 MG tablet Take 1 tablet (500 mg total) by mouth 2 (two) times daily for 7 days. 05/02/21 05/09/21 Yes Miamarie Moll, Charlesetta Ivory, PA-C    Allergies Eggs or egg-derived products, Iohexol, Montelukast sodium, and Penicillins  Family History  Problem Relation Age of Onset  . Asthma Mother   . Asthma Father     Social History Social History   Tobacco Use  . Smoking status: Never Smoker  . Smokeless tobacco: Never Used  . Tobacco comment: Never used e-cigarettes   Vaping Use  . Vaping Use: Never used  Substance Use Topics  . Alcohol use: Yes    Alcohol/week: 1.0 standard drink    Types: 1 Shots of liquor per week    Comment: Last ETOH use 10/2020  . Drug use: Not Currently    Types: Marijuana    Comment: Last marijuana "years ago"    Review of Systems  Constitutional: Negative for fever. Eyes: Negative for visual changes. ENT: Negative for sore throat. Cardiovascular: Negative for chest pain. Respiratory: Negative for shortness of breath. Gastrointestinal: Negative for abdominal pain, vomiting and diarrhea. Genitourinary: Negative for dysuria. Cloudy urine Musculoskeletal: Negative for back pain. Skin: Negative for rash. Neurological: Negative for headaches, focal weakness or numbness. ____________________________________________  PHYSICAL EXAM:  VITAL SIGNS: ED Triage Vitals [05/02/21 1932]  Enc Vitals Group     BP 133/74     Pulse Rate 71     Resp 18     Temp 98.5 F (36.9 C)     Temp Source Oral     SpO2 99 %     Weight      Height      Head Circumference      Peak Flow      Pain Score      Pain Loc      Pain Edu?      Excl. in GC?     Constitutional: Alert and oriented. Well appearing and in no distress. Head: Normocephalic and atraumatic. Eyes: Conjunctivae are  normal. Normal extraocular movements Cardiovascular: Normal rate, regular rhythm. Normal distal pulses. Respiratory: Normal respiratory effort. No wheezes/rales/rhonchi. GU: Normal external genitalia.  No significant vaginal discharge noted. Musculoskeletal: Nontender with normal range of motion in all extremities.  Neurologic:  Normal gait without ataxia. Normal speech and language. No gross focal neurologic deficits are appreciated. Skin:  Skin is warm, dry and intact. No rash noted. Psychiatric: Mood and affect are normal. Patient exhibits appropriate insight and judgment. ____________________________________________   LABS (pertinent  positives/negatives)  Labs Reviewed  WET PREP, GENITAL - Abnormal; Notable for the following components:      Result Value   Trich, Wet Prep PRESENT (*)    WBC, Wet Prep HPF POC RARE (*)    All other components within normal limits  URINALYSIS, COMPLETE (UACMP) WITH MICROSCOPIC - Abnormal; Notable for the following components:   Color, Urine YELLOW (*)    APPearance CLOUDY (*)    Leukocytes,Ua SMALL (*)    Bacteria, UA RARE (*)    All other components within normal limits  CHLAMYDIA/NGC RT PCR ([facility] ONLY)  POC URINE PREG, ED  ____________________________________________  PROCEDURES  Ceftriaxone 500 mg IM Azithromycin 1 g p.o.  Procedures ____________________________________________   INITIAL IMPRESSION / ASSESSMENT AND PLAN / ED COURSE  As part of my medical decision making, I reviewed the following data within the electronic MEDICAL RECORD NUMBER Labs reviewed as above and Notes from prior ED visits  Patient with ED evaluation for STD exposure.  Patient admits to previously being treated for an STD, however she had contact with her partner who had not been treated as directed.  She is found to have trichomoniasis on wet prep.  GC culture is pending at the time of this disposition.  Patient is treated however for gonorrhea chlamydia with an IM dose of ceftriaxone, gram of azithromycin, and a prescription for doxycycline to take as directed.  She will also be discharged with metronidazole for her trichomoniasis.  She will follow-up with [facility] department for ongoing symptoms.  Return precautions have been provided.   Kathryn Nash was evaluated in Emergency Department on 05/02/2021 for the symptoms described in the history of present illness. She was evaluated in the context of the global COVID-19 pandemic, which necessitated consideration that the patient might be at risk for infection with the SARS-CoV-2 virus that causes COVID-19. Institutional protocols and  algorithms that pertain to the evaluation of patients at risk for COVID-19 are in a state of rapid change based on information released by regulatory bodies including the CDC and federal and state organizations. These policies and algorithms were followed during the patient's care in the ED. ____________________________________________  FINAL CLINICAL IMPRESSION(S) / ED DIAGNOSES  Final diagnoses:  Encounter for assessment of STD exposure  Trichimoniasis      Dahmir Epperly, Charlesetta Ivory, PA-C 05/02/21 2203    Concha Se, MD 05/04/21 [phone]

## 2021-05-02 NOTE — Discharge Instructions (Addendum)
Take the 2 antibiotics as directed. Avoid any sexual contact until your meds are completed and you and your partner are symptom free.

## 2021-05-02 NOTE — ED Triage Notes (Signed)
Pt reports she was diagnosed by the health department last week with gonorrhea and per the HD, she would need to come to ED for a shot due to them being booked out. Pt also c/o cloudy urine and lower back pain.

## 2021-05-03 DIAGNOSIS — Z419 Encounter for procedure for purposes other than remedying health state, unspecified: Secondary | ICD-10-CM | POA: Diagnosis not present

## 2021-05-03 LAB — CHLAMYDIA/NGC RT PCR (ARMC ONLY)
Chlamydia Tr: NOT DETECTED
N gonorrhoeae: NOT DETECTED

## 2021-05-11 ENCOUNTER — Ambulatory Visit: Payer: Medicaid Other | Admitting: Advanced Practice Midwife

## 2021-05-11 ENCOUNTER — Other Ambulatory Visit: Payer: Self-pay

## 2021-05-11 DIAGNOSIS — Z113 Encounter for screening for infections with a predominantly sexual mode of transmission: Secondary | ICD-10-CM

## 2021-05-11 LAB — WET PREP FOR TRICH, YEAST, CLUE
Trichomonas Exam: NEGATIVE
Yeast Exam: NEGATIVE

## 2021-05-11 MED ORDER — DOXYCYCLINE HYCLATE 100 MG PO TABS: 100.0000 mg | ORAL_TABLET | Freq: Two times a day (BID) | ORAL | 0 refills | Status: AC

## 2021-05-11 MED ORDER — CEFTRIAXONE SODIUM 500 MG IJ SOLR
500.0000 mg | Freq: Once | INTRAMUSCULAR | Status: AC
  Administered 2021-05-11: 17:00:00 500 mg via INTRAMUSCULAR

## 2021-05-11 NOTE — Addendum Note (Signed)
Addended by: Berdie Ogren on: 05/11/2021 06:19 PM   Modules accepted: Orders

## 2021-05-11 NOTE — Progress Notes (Signed)
Pt here for STD screening.  Wet mount results reviewed.  Medication dispensed and Ceftriaxone injection given without any complications.   Pt stated that she had Ceftriaxone recently and she did not have any problems.  Pt declined condoms. Berdie Ogren, RN

## 2021-05-11 NOTE — Addendum Note (Signed)
Addended by: Berdie Ogren on: 05/11/2021 06:24 PM   Modules accepted: Orders

## 2021-05-11 NOTE — Progress Notes (Signed)
[facility] Department STI clinic/screening visit  Subjective:  Kathryn Nash is a 25 y.o. SBF nonsmoker G2P0 female being seen today for an STI screening visit. The patient reports they do not have symptoms.  Patient reports that they do not desire a pregnancy in the next year.   They reported they are not interested in discussing contraception today.  Patient's last menstrual period was 03/24/2021.   Patient has the following medical conditions:   Patient Active Problem List   Diagnosis Date Noted   Morbid obesity (HCC) 220 lbs 10/20/2019   Asthma exacerbation 12/08/2018    Chief Complaint  Patient presents with   SEXUALLY TRANSMITTED DISEASE    screening    HPI  Patient reports went to ER 05/02/21 and treated for GC/Chlamydia (record states negative GC/Chlamydia but + trich).  Last sex 05/05/21 without condom; with current partner x 4 mo. LMP 03/24/21. Last ETOH 04/24/21 (2 shots vodka) "occasionally".  Last HIV test per patient/review of record was 10/14/20 Patient reports last pap was can't remember.   See flowsheet for further details and programmatic requirements.    The following portions of the patient's history were reviewed and updated as appropriate: allergies, current medications, past medical history, past social history, past surgical history and problem list.  Objective:  There were no vitals filed for this visit.  Physical Exam Vitals and nursing note reviewed.  Constitutional:      Appearance: Normal appearance. She is obese.  HENT:     Head: Normocephalic and atraumatic.     Mouth/Throat:     Mouth: Mucous membranes are moist.     Pharynx: Oropharynx is clear. No oropharyngeal exudate or posterior oropharyngeal erythema.  Eyes:     Conjunctiva/sclera: Conjunctivae normal.  Pulmonary:     Effort: Pulmonary effort is normal.  Chest:  Breasts:    Right: No axillary adenopathy or supraclavicular adenopathy.     Left: No axillary adenopathy or  supraclavicular adenopathy.  Abdominal:     Palpations: Abdomen is soft. There is no mass.     Tenderness: There is no abdominal tenderness. There is no right CVA tenderness, left CVA tenderness, guarding or rebound.  Genitourinary:    General: Normal vulva.     Exam position: Lithotomy position.     Pubic Area: No rash or pubic lice.      Labia:        Right: No rash or lesion.        Left: No rash or lesion.      Vagina: Normal. No vaginal discharge (white creamy leukorrhea, ph<4.5), erythema, bleeding or lesions.     Cervix: No cervical motion tenderness, discharge, friability, lesion or erythema.     Uterus: Normal.      Adnexa: Right adnexa normal and left adnexa normal.     Rectum: Normal.  Lymphadenopathy:     Head:     Right side of head: No preauricular or posterior auricular adenopathy.     Left side of head: No preauricular or posterior auricular adenopathy.     Cervical: No cervical adenopathy.     Upper Body:     Right upper body: No supraclavicular or axillary adenopathy.     Left upper body: No supraclavicular or axillary adenopathy.     Lower Body: No right inguinal adenopathy. No left inguinal adenopathy.  Skin:    General: Skin is warm and dry.     Findings: No rash.  Neurological:     Mental  Status: She is alert and oriented to person, place, and time.     Assessment and Plan:  Kathryn Nash is a 25 y.o. female presenting to the [facility] Department for STI screening  1. Screening examination for venereal disease Please treat for GC/chlamydia per standing orders (+trich in ER 05/02/21 and partner treated for GC/Chlamydia) Neg PT in ER 05/02/21; pt declines birth control Treat wet mount per standing orders Immunization nurse consult  - WET PREP FOR TRICH, YEAST, CLUE - Chlamydia/Gonorrhea [place] [facility]     No follow-ups on file.  No future appointments.  Alberteen Spindle, CNM

## 2021-06-02 DIAGNOSIS — Z419 Encounter for procedure for purposes other than remedying health state, unspecified: Secondary | ICD-10-CM | POA: Diagnosis not present

## 2021-06-23 ENCOUNTER — Ambulatory Visit: Payer: Medicaid Other

## 2021-07-03 DIAGNOSIS — Z419 Encounter for procedure for purposes other than remedying health state, unspecified: Secondary | ICD-10-CM | POA: Diagnosis not present

## 2021-07-06 ENCOUNTER — Other Ambulatory Visit: Payer: Self-pay

## 2021-07-06 ENCOUNTER — Ambulatory Visit (LOCAL_COMMUNITY_HEALTH_CENTER): Payer: Medicaid Other | Admitting: Physician Assistant

## 2021-07-06 ENCOUNTER — Encounter: Payer: Self-pay | Admitting: Advanced Practice Midwife

## 2021-07-06 VITALS — BP 110/71 | Ht 66.0 in | Wt 228.0 lb

## 2021-07-06 DIAGNOSIS — Z3009 Encounter for other general counseling and advice on contraception: Secondary | ICD-10-CM

## 2021-07-06 DIAGNOSIS — Z124 Encounter for screening for malignant neoplasm of cervix: Secondary | ICD-10-CM

## 2021-07-06 DIAGNOSIS — Z309 Encounter for contraceptive management, unspecified: Secondary | ICD-10-CM | POA: Diagnosis not present

## 2021-07-06 DIAGNOSIS — Z01419 Encounter for gynecological examination (general) (routine) without abnormal findings: Secondary | ICD-10-CM | POA: Diagnosis not present

## 2021-07-06 DIAGNOSIS — Z113 Encounter for screening for infections with a predominantly sexual mode of transmission: Secondary | ICD-10-CM

## 2021-07-06 LAB — WET PREP FOR TRICH, YEAST, CLUE
Trichomonas Exam: NEGATIVE
Yeast Exam: NEGATIVE

## 2021-07-06 LAB — HM HEPATITIS C SCREENING LAB: HM Hepatitis Screen: NEGATIVE

## 2021-07-06 LAB — HM HIV SCREENING LAB: HM HIV Screening: NEGATIVE

## 2021-07-06 NOTE — Progress Notes (Signed)
Pt chooses abstinence for [facility] and knows she can RTC if she changes her mind about another form of BC. Wet mount reviewed by provider, no tx per provider orders. Provider orders completed.

## 2021-07-07 ENCOUNTER — Encounter: Payer: Self-pay | Admitting: Physician Assistant

## 2021-07-07 NOTE — Progress Notes (Signed)
[facility] [address] Main Number: [phone]    Family Planning Visit- Initial Visit  Subjective:  Kathryn Nash is a 25 y.o.  G2P0010   being seen today for an initial annual visit and to discuss contraceptive options.  The patient is currently using Abstinence for pregnancy prevention. Patient reports she does not want a pregnancy in the next year.  Patient has the following medical conditions has Asthma exacerbation and Morbid obesity (HCC) 220 lbs on their problem list.  Chief Complaint  Patient presents with   Contraception    Annual exam and discuss options.    Patient reports that she is here for a physical. States that at this time she is not interested in hormonal BCM.  Reports that she had a tubal pregnancy toward the end of last year that did not require surgery and she has completed all recommended follow up .  Reports that she was having some vaginal irritation due to changing her soap but that this has not resolved.  Reports that she had gained some weight, but has recently started exercising regularly and eating a healthier diet to help her with weight loss.  Per chart review, CBE and pap are due today.   Patient denies any other concerns today.   Body mass index is 36.8 kg/m. - Patient is eligible for diabetes screening based on BMI and age >78?  not applicable HA1C ordered? not applicable  Patient reports 2  partner/s in last year. Desires STI screening?  Yes  Has patient been screened once for HCV in the past?  No  No results found for: HCVAB  Does the patient have current drug use (including MJ), have a partner with drug use, and/or has been incarcerated since last result? No  If yes-- Screen for HCV through [facility]   Does the patient meet criteria for HBV testing? No  Criteria:  -Household, sexual or needle sharing contact with HBV -History of drug use -HIV positive -Those with known  Hep C   Health Maintenance Due  Topic Date Due   COVID-19 Vaccine (1) Never done   HPV VACCINES (1 - 2-dose series) Never done   Hepatitis C Screening  Never done   PAP-Cervical Cytology Screening  Never done   PAP SMEAR-Modifier  Never done   INFLUENZA VACCINE  07/03/2021    Review of Systems  All other systems reviewed and are negative.  The following portions of the patient's history were reviewed and updated as appropriate: allergies, current medications, past family history, past medical history, past social history, past surgical history and problem list. Problem list updated.   See flowsheet for other program required questions.  Objective:   Vitals:   07/06/21 1555  BP: 110/71  Weight: 228 lb (103.4 kg)  Height: 5\' 6"  (1.676 m)    Physical Exam Vitals and nursing note reviewed.  Constitutional:      General: She is not in acute distress.    Appearance: Normal appearance.  HENT:     Head: Normocephalic and atraumatic.     Mouth/Throat:     Mouth: Mucous membranes are moist.     Pharynx: Oropharynx is clear. No oropharyngeal exudate or posterior oropharyngeal erythema.  Eyes:     Conjunctiva/sclera: Conjunctivae normal.  Cardiovascular:     Rate and Rhythm: Normal rate and regular rhythm.  Pulmonary:     Effort: Pulmonary effort is normal.     Breath sounds:  Normal breath sounds.  Abdominal:     Palpations: Abdomen is soft. There is no mass.     Tenderness: There is no abdominal tenderness. There is no guarding or rebound.  Genitourinary:    General: Normal vulva.     Rectum: Normal.     Comments: External genitalia/pubic area without nits, lice, edema, erythema, lesions and inguinal adenopathy. Vagina with normal mucosa and discharge. Cervix without visible lesions. Uterus firm, mobile, nt, no masses, no CMT, no adnexal tenderness or fullness.  Musculoskeletal:     Cervical back: Neck supple. No tenderness.  Lymphadenopathy:     Cervical: No cervical  adenopathy.  Skin:    General: Skin is warm and dry.     Findings: No bruising, erythema, lesion or rash.     Comments: Multiple tattoos.  Neurological:     Mental Status: She is alert and oriented to person, place, and time.  Psychiatric:        Mood and Affect: Mood normal.        Behavior: Behavior normal.        Thought Content: Thought content normal.        Judgment: Judgment normal.      Assessment and Plan:  Kathryn Nash is a 25 y.o. female presenting to the [facility] Department for an initial annual wellness/contraceptive visit  Contraception counseling: Reviewed all forms of birth control options in the tiered based approach. available including abstinence; over the counter/barrier methods; hormonal contraceptive medication including pill, patch, ring, injection,contraceptive implant, ECP; hormonal and nonhormonal IUDs; permanent sterilization options including vasectomy and the various tubal sterilization modalities. Risks, benefits, and typical effectiveness rates were reviewed.  Questions were answered.  Written information was also given to the patient to review.  Patient desires to continue with abstinence for now. She will follow up in  1 year and prn for surveillance.  She was told to call with any further questions, or with any concerns about this method of contraception.  Emphasized use of condoms 100% of the time for STI prevention.  Patient was not a candidate for ECP today.   1. Encounter for counseling regarding contraception Reviewed with patient as above re: BCM options. Reviewed with patient that she can RTC to discuss hormonal BCM further if she changes her mind. Enc condoms with all sex for STD protection.   2. Screening for STD (sexually transmitted disease) Await test results.  Counseled that RN will call if needs to RTC for treatment once results are back. - WET PREP FOR TRICH, YEAST, CLUE - Chlamydia/Gonorrhea [place] [facility] - HIV/HCV [place]  [facility] - Syphilis Serology, [place] [facility]  3. Routine Papanicolaou smear Await pap results.  Counseled patient that RN will call or send letter once results are back with follow up plan/recommendations.  - IGP, rfx Aptima HPV ASCU  4. Well woman exam with routine gynecological exam Reviewed with patient healthy habits to maintain general health. Enc patient to continue healthy diet and regular exercise in her effort to lose weight. Enc MVI 1 po daily. Enc to establish with/ follow up with PCP for primary care concerns, age appropriate screenings and illness.      Return in about 1 year (around 07/06/2022) for RP and prn.  No future appointments.  Matt Holmes, PA

## 2021-07-10 LAB — IGP, RFX APTIMA HPV ASCU: PAP Smear Comment: 0

## 2021-08-03 DIAGNOSIS — Z419 Encounter for procedure for purposes other than remedying health state, unspecified: Secondary | ICD-10-CM | POA: Diagnosis not present

## 2021-08-16 ENCOUNTER — Ambulatory Visit: Payer: Medicaid Other | Admitting: Physician Assistant

## 2021-08-16 ENCOUNTER — Other Ambulatory Visit: Payer: Self-pay

## 2021-08-16 DIAGNOSIS — Z113 Encounter for screening for infections with a predominantly sexual mode of transmission: Secondary | ICD-10-CM

## 2021-08-16 LAB — WET PREP FOR TRICH, YEAST, CLUE
Trichomonas Exam: NEGATIVE
Yeast Exam: NEGATIVE

## 2021-08-16 NOTE — Progress Notes (Signed)
Pt here for STD screening.  Wet mount results reviewed, no treatment required per Provider.  Sabri Teal M Mar Zettler, RN  

## 2021-08-17 ENCOUNTER — Encounter: Payer: Self-pay | Admitting: Physician Assistant

## 2021-08-17 NOTE — Progress Notes (Signed)
  [facility] Department STI clinic/screening visit  Subjective:  Kathryn Nash is a 25 y.o. female being seen today for an STI screening visit. The patient reports they do not have symptoms.  Patient reports that they do not desire a pregnancy in the next year.   They reported they are not interested in discussing contraception today.  Patient's last menstrual period was 09/01/2020 (approximate).   Patient has the following medical conditions:   Patient Active Problem List   Diagnosis Date Noted   Morbid obesity (HCC) 220 lbs 10/20/2019   Asthma exacerbation 12/08/2018    Chief Complaint  Patient presents with   SEXUALLY TRANSMITTED DISEASE    HPI  Patient reports that she would like a screening today. States that she has noticed a clear discharge and wants to make sure everything is OK.  Denies surgeries and regular medicines.  LMP was 07/14/2021 and using condoms as BCM.  States last HIV test was last month and last pap was last month.   See flowsheet for further details and programmatic requirements.    The following portions of the patient's history were reviewed and updated as appropriate: allergies, current medications, past medical history, past social history, past surgical history and problem list.  Objective:  There were no vitals filed for this visit.  Physical Exam Constitutional:      General: She is not in acute distress.    Appearance: Normal appearance.  HENT:     Head: Normocephalic and atraumatic.  Eyes:     Conjunctiva/sclera: Conjunctivae normal.  Pulmonary:     Effort: Pulmonary effort is normal.  Skin:    General: Skin is warm and dry.     Findings: No bruising, erythema, lesion or rash.  Neurological:     Mental Status: She is alert and oriented to person, place, and time.  Psychiatric:        Mood and Affect: Mood normal.        Behavior: Behavior normal.        Thought Content: Thought content normal.        Judgment: Judgment  normal.     Assessment and Plan:  Kathryn Nash is a 25 y.o. female presenting to the [facility] Department for STI screening  1. Screening for STD (sexually transmitted disease) Patient into clinic without symptoms. Patient declines pelvic by provider and opts to self-collect vaginal samples.  Counseled patient how to collect for accurate results.  Reviewed wet mount results. Rec condoms with all sex. Await test results.  Counseled that RN will call if needs to RTC for treatment once results are back.  - WET PREP FOR TRICH, YEAST, CLUE - Chlamydia/Gonorrhea [place] [facility]     No follow-ups on file.  No future appointments.  Matt Holmes, PA

## 2021-09-02 DIAGNOSIS — Z419 Encounter for procedure for purposes other than remedying health state, unspecified: Secondary | ICD-10-CM | POA: Diagnosis not present

## 2021-09-10 ENCOUNTER — Emergency Department
Admission: EM | Admit: 2021-09-10 | Discharge: 2021-09-10 | Disposition: A | Discharge status: Home / Self Care | Payer: Medicaid Other | Attending: Emergency Medicine | Admitting: Emergency Medicine

## 2021-09-10 ENCOUNTER — Other Ambulatory Visit: Payer: Self-pay

## 2021-09-10 DIAGNOSIS — J441 Chronic obstructive pulmonary disease with (acute) exacerbation: Secondary | ICD-10-CM | POA: Insufficient documentation

## 2021-09-10 DIAGNOSIS — R1030 Lower abdominal pain, unspecified: Secondary | ICD-10-CM | POA: Insufficient documentation

## 2021-09-10 LAB — CBC
HCT: 39.1 % (ref 36.0–46.0)
Hemoglobin: 13.5 g/dL (ref 12.0–15.0)
MCH: 31.3 pg (ref 26.0–34.0)
MCHC: 34.5 g/dL (ref 30.0–36.0)
MCV: 90.7 fL (ref 80.0–100.0)
Platelets: 433 10*3/uL — ABNORMAL HIGH (ref 150–400)
RBC: 4.31 MIL/uL (ref 3.87–5.11)
RDW: 12.6 % (ref 11.5–15.5)
WBC: 12.7 10*3/uL — ABNORMAL HIGH (ref 4.0–10.5)
nRBC: 0 % (ref 0.0–0.2)

## 2021-09-10 LAB — URINALYSIS, COMPLETE (UACMP) WITH MICROSCOPIC
Bacteria, UA: NONE SEEN
Bilirubin Urine: NEGATIVE
Glucose, UA: NEGATIVE mg/dL
Hgb urine dipstick: NEGATIVE
Ketones, ur: NEGATIVE mg/dL
Leukocytes,Ua: NEGATIVE
Nitrite: NEGATIVE
Protein, ur: NEGATIVE mg/dL
Specific Gravity, Urine: 1.019 (ref 1.005–1.030)
pH: 5 (ref 5.0–8.0)

## 2021-09-10 LAB — COMPREHENSIVE METABOLIC PANEL
ALT: 16 U/L (ref 0–44)
AST: 17 U/L (ref 15–41)
Albumin: 3.7 g/dL (ref 3.5–5.0)
Alkaline Phosphatase: 57 U/L (ref 38–126)
Anion gap: 6 (ref 5–15)
BUN: 9 mg/dL (ref 6–20)
CO2: 25 mmol/L (ref 22–32)
Calcium: 8.7 mg/dL — ABNORMAL LOW (ref 8.9–10.3)
Chloride: 106 mmol/L (ref 98–111)
Creatinine, Ser: 0.76 mg/dL (ref 0.44–1.00)
GFR, Estimated: >60 mL/min (ref >60)
Glucose, Bld: 101 mg/dL — ABNORMAL HIGH (ref 70–99)
Potassium: 3.8 mmol/L (ref 3.5–5.1)
Sodium: 137 mmol/L (ref 135–145)
Total Bilirubin: 0.7 mg/dL (ref 0.3–1.2)
Total Protein: 7.1 g/dL (ref 6.5–8.1)

## 2021-09-10 LAB — CHLAMYDIA/NGC RT PCR (ARMC ONLY)
Chlamydia Tr: NOT DETECTED
N gonorrhoeae: NOT DETECTED

## 2021-09-10 LAB — WET PREP, GENITAL
Clue Cells Wet Prep HPF POC: NONE SEEN
Sperm: NONE SEEN
Trich, Wet Prep: NONE SEEN
Yeast Wet Prep HPF POC: NONE SEEN

## 2021-09-10 LAB — POC URINE PREG, ED: Preg Test, Ur: NEGATIVE

## 2021-09-10 LAB — LIPASE, BLOOD: Lipase: 22 U/L (ref 11–51)

## 2021-09-10 NOTE — ED Triage Notes (Signed)
Pt with lower abdominal pain x one week, pt states she has not had period since August but pregnancy test in September was negative. Pt states she is usually regular with her periods. Pt denies N/V/D. Pt denies urinary symptoms. Pt states history of ectopic pregnancy and miscarriage.

## 2021-09-10 NOTE — ED Triage Notes (Signed)
Pt c/o lower abd pain for the past week. Denies N/V/D.Marland Kitchen pt is in NAD

## 2021-09-10 NOTE — ED Provider Notes (Signed)
[facility] Emergency Department Provider Note   ____________________________________________   Event Date/Time   First MD Initiated Contact with Patient 09/10/21 1033     (approximate)  I have reviewed the triage vital signs and the nursing notes.   HISTORY  Chief Complaint Abdominal Pain    HPI Kathryn Nash is a 25 y.o. female who complains of crampy lower abdominal pain for about a week.  She says she has not had a menstrual period for about a month and a half.  Pregnancy test done last month was negative.  She is usually regular with her menses.  Pain is not severe.  She has no nausea vomiting or diarrhea.  Has no vaginal discharge but asked if we could check her for STD.  Pain is not made worse by palpation percussion of the bumps in the road.         Past Medical History:  Diagnosis Date   Asthma     Patient Active Problem List   Diagnosis Date Noted   Morbid obesity (HCC) 220 lbs 10/20/2019   Asthma exacerbation 12/08/2018    Past Surgical History:  Procedure Laterality Date   NO PAST SURGERIES      Prior to Admission medications   Medication Sig Start Date End Date Taking? Authorizing Provider  doxycycline (VIBRA-TABS) 100 MG tablet Take 1 tablet (100 mg total) by mouth 2 (two) times daily. Patient not taking: No sig reported 05/02/21   Menshew, Charlesetta Ivory, PA-C    Allergies Eggs or egg-derived products, Iohexol, Montelukast sodium, and Penicillins  Family History  Problem Relation Age of Onset   Asthma Mother    Asthma Father     Social History Social History   Tobacco Use   Smoking status: Never   Smokeless tobacco: Never   Tobacco comments:    Never used e-cigarettes  Vaping Use   Vaping Use: Never used  Substance Use Topics   Alcohol use: Not Currently    Alcohol/week: 1.0 standard drink    Types: 1 Shots of liquor per week   Drug use: Not Currently    Types: Marijuana    Comment: Last marijuana "years  ago"    Review of Systems  Constitutional: No fever/chills Eyes: No visual changes. ENT: No sore throat. Cardiovascular: Denies chest pain. Respiratory: Denies shortness of breath. Gastrointestinal: Crampy abdominal pain.  No nausea, no vomiting.  No diarrhea.  No constipation. Genitourinary: Negative for dysuria. Musculoskeletal: Negative for back pain. Skin: Negative for rash. Neurological: Negative for headaches, focal weakness   ____________________________________________   PHYSICAL EXAM:  VITAL SIGNS: ED Triage Vitals  Enc Vitals Group     BP 09/10/21 0950 108/77     Pulse Rate 09/10/21 0950 73     Resp 09/10/21 0950 18     Temp 09/10/21 0950 98 F (36.7 C)     Temp Source 09/10/21 0950 Oral     SpO2 09/10/21 0950 98 %     Weight 09/10/21 0951 228 lb (103.4 kg)     Height 09/10/21 0951 5\' 6"  (1.676 m)     Head Circumference --      Peak Flow --      Pain Score 09/10/21 0950 7     Pain Loc --      Pain Edu? --      Excl. in GC? --    Constitutional: Alert and oriented. Well appearing and in no acute distress. Eyes: Conjunctivae are normal.  Head: Atraumatic. Nose: No congestion/rhinnorhea. Mouth/Throat: Mucous membranes are moist.  Oropharynx non-erythematous. Neck: No stridor.  Cardiovascular: Normal rate, regular rhythm. Grossly normal heart sounds.  Good peripheral circulation. Respiratory: Normal respiratory effort.  No retractions. Lungs CTAB. Gastrointestinal: Soft and nontender. No distention. No abdominal bruits. No CVA tenderness. Genitourinary: Normal perineum and vagina.  Normal cervix no cervical motion tenderness or adnexal tenderness no masses are palpated.  There is no discharge. Musculoskeletal: No lower extremity tenderness nor edema.   Neurologic:  Normal speech and language. No gross focal neurologic deficits are appreciated.  Skin:  Skin is warm, dry and intact. No rash noted.   ____________________________________________   LABS (all  labs ordered are listed, but only abnormal results are displayed)  Labs Reviewed  WET PREP, GENITAL - Abnormal; Notable for the following components:      Result Value   WBC, Wet Prep HPF POC FEW (*)    All other components within normal limits  COMPREHENSIVE METABOLIC PANEL - Abnormal; Notable for the following components:   Glucose, Bld 101 (*)    Calcium 8.7 (*)    All other components within normal limits  CBC - Abnormal; Notable for the following components:   WBC 12.7 (*)    Platelets 433 (*)    All other components within normal limits  URINALYSIS, COMPLETE (UACMP) WITH MICROSCOPIC - Abnormal; Notable for the following components:   Color, Urine YELLOW (*)    APPearance CLEAR (*)    All other components within normal limits  CHLAMYDIA/NGC RT PCR ([facility] ONLY)            LIPASE, BLOOD  POC URINE PREG, ED   ____________________________________________  EKG   ____________________________________________  RADIOLOGY Jill Poling, personally viewed and evaluated these images (plain radiographs) as part of my medical decision making, as well as reviewing the written report by the radiologist.  ED MD interpretation:    Official radiology report(s): No results found.  ____________________________________________   PROCEDURES  Procedure(s) performed (including Critical Care):  Procedures   ____________________________________________   INITIAL IMPRESSION / ASSESSMENT AND PLAN / ED COURSE  Patient's physical exam is within normal limits as far as the abdominal pain goes.  She has no cervical motion tenderness or masses or discharge.  Her pregnancy test is negative wet prep is essentially normal with just a few WBCs.  STD testing is still pending.  Patient's WBC count is 12,000 with this is in keeping with several CBCs done back to the beginning of last year.  I will let her go.  I will have her use Motrin if needed for pain.  She will return for worse pain fever  vomiting or feeling sicker.  There is no sign of any diverticulitis appendicitis torsion of the ovaries or bowel or PID or any other problem at this point.              ____________________________________________   FINAL CLINICAL IMPRESSION(S) / ED DIAGNOSES  Final diagnoses:  Lower abdominal pain     ED Discharge Orders     None        Note:  This document was prepared using Dragon voice recognition software and may include unintentional dictation errors.    Arnaldo Natal, MD 09/10/21 1139

## 2021-09-10 NOTE — Discharge Instructions (Addendum)
Please return for fever, nausea or vomiting.  Please also return for worse pain or feeling sicker.  Please also feel free to return or follow-up with your doctor if you are not any better in 3 to 4 days.  It may be that your menstrual period is starting.  Motrin may help with the pain.  You can try 3 of the over-the-counter pills 3 times a day for about 3 days with food each time.  This may help.

## 2021-10-03 DIAGNOSIS — Z419 Encounter for procedure for purposes other than remedying health state, unspecified: Secondary | ICD-10-CM | POA: Diagnosis not present

## 2021-10-10 ENCOUNTER — Ambulatory Visit: Payer: Medicaid Other | Admitting: Physician Assistant

## 2021-10-10 ENCOUNTER — Other Ambulatory Visit: Payer: Self-pay

## 2021-10-10 DIAGNOSIS — Z113 Encounter for screening for infections with a predominantly sexual mode of transmission: Secondary | ICD-10-CM | POA: Diagnosis not present

## 2021-10-10 DIAGNOSIS — Z7189 Other specified counseling: Secondary | ICD-10-CM

## 2021-10-10 LAB — WET PREP FOR TRICH, YEAST, CLUE
Trichomonas Exam: NEGATIVE
Yeast Exam: NEGATIVE

## 2021-10-10 NOTE — Progress Notes (Signed)
Pt here for STD screening.  Wet prep results reviewed and no treatment required per Provider.  Pt declined condoms. Berdie Ogren, RN

## 2021-10-11 ENCOUNTER — Encounter: Payer: Self-pay | Admitting: Physician Assistant

## 2021-10-11 NOTE — Progress Notes (Signed)
  [facility] Department STI clinic/screening visit  Subjective:  Kathryn Nash is a 25 y.o. female being seen today for an STI screening visit. The patient reports they do not have symptoms.  Patient reports that they do not desire a pregnancy in the next year.   They reported they are not interested in discussing contraception today.  No LMP recorded (lmp unknown).   Patient has the following medical conditions:   Patient Active Problem List   Diagnosis Date Noted   Morbid obesity (HCC) 220 lbs 10/20/2019   Asthma exacerbation 12/08/2018    Chief Complaint  Patient presents with   SEXUALLY TRANSMITTED DISEASE    Screening     HPI  Patient reports that she is not having any symptoms but would like a screening today.  States that her last HIV test was about 1 month ago and last pap was in August of this year.   LMP 07/2021 and is using condoms as her BCM.  Patient reports that she is concerned that she has not had  a period.   See flowsheet for further details and programmatic requirements.    The following portions of the patient's history were reviewed and updated as appropriate: allergies, current medications, past medical history, past social history, past surgical history and problem list.  Objective:  There were no vitals filed for this visit.  Physical Exam Constitutional:      General: She is not in acute distress.    Appearance: Normal appearance.  HENT:     Head: Normocephalic and atraumatic.  Eyes:     Conjunctiva/sclera: Conjunctivae normal.  Pulmonary:     Effort: Pulmonary effort is normal.  Musculoskeletal:     Cervical back: Neck supple. No tenderness.  Skin:    General: Skin is warm and dry.     Findings: No bruising, erythema, lesion or rash.  Neurological:     Mental Status: She is alert and oriented to person, place, and time.  Psychiatric:        Mood and Affect: Mood normal.        Behavior: Behavior normal.        Thought Content:  Thought content normal.        Judgment: Judgment normal.     Assessment and Plan:  Kathryn Nash is a 25 y.o. female presenting to the [facility] Department for STI screening  1. Screening for STD (sexually transmitted disease) Patient into clinic without symptoms. Patient declines blood work today. Patient declines pelvic by provider and opts to self-collect vaginal samples.  Counseled how to collect for accurate results. Rec condoms with all sex. Await test results.  Counseled that RN will call if needs to RTC for treatment once results are back.  - WET PREP FOR TRICH, YEAST, CLUE - Chlamydia/Gonorrhea [place] [facility]  2. Other specified counseling Counseled patient that if she has had added stressors in her life, that can cause irregular periods whether it is skipping periods or having heavy, long periods.   Rec that patient check a pregnancy test once it has been at least 2 weeks since her last sexual activity.    No follow-ups on file.  No future appointments.  Matt Holmes, PA

## 2021-11-02 DIAGNOSIS — Z419 Encounter for procedure for purposes other than remedying health state, unspecified: Secondary | ICD-10-CM | POA: Diagnosis not present

## 2021-11-15 ENCOUNTER — Encounter: Payer: Self-pay | Admitting: Advanced Practice Midwife

## 2021-11-15 ENCOUNTER — Ambulatory Visit (LOCAL_COMMUNITY_HEALTH_CENTER): Payer: Medicaid Other | Admitting: Advanced Practice Midwife

## 2021-11-15 ENCOUNTER — Other Ambulatory Visit: Payer: Self-pay

## 2021-11-15 VITALS — BP 120/78 | Ht 66.0 in | Wt 230.8 lb

## 2021-11-15 DIAGNOSIS — Z3009 Encounter for other general counseling and advice on contraception: Secondary | ICD-10-CM | POA: Diagnosis not present

## 2021-11-15 DIAGNOSIS — Z30011 Encounter for initial prescription of contraceptive pills: Secondary | ICD-10-CM | POA: Diagnosis not present

## 2021-11-15 MED ORDER — NORGESTIM-ETH ESTRAD TRIPHASIC 0.18/0.215/0.25 MG-25 MCG PO TABS: 1.0000 | ORAL_TABLET | Freq: Every day | ORAL | 10 refills | Status: DC

## 2021-11-15 NOTE — Progress Notes (Signed)
Pt is here to discuss birth control.  OCP consent form signed and Rx sent to her Pharmacy, by the Provider.  Pt informed to call with any questions or concerns.  Berdie Ogren, RN

## 2021-11-15 NOTE — Progress Notes (Signed)
[facility] Main Number: [phone]  Contraception/Family Planning VISIT ENCOUNTER NOTE  Subjective:   Kathryn Nash is a 25 y.o. SBF G2P0010 nonsmoker female here for reproductive life counseling. The patient is currently using No Method - Other Reason to prevent pregnancy.  Desires ocp's for BCM.  The patient does not want a pregnancy in the next year.  Last physical 07/06/21. Last pap 07/06/21 neg.  Employed 40 hrs/wk. Living with her mom and little brother. LMP 07/17/21. Last sex 10/29/21 without condom; with current partner x 2 mo; 1 sex partner in lst 3 mo.    Denies abnormal vaginal bleeding, discharge, pelvic pain, problems with intercourse or other gynecologic concerns.    Gynecologic History Patient's last menstrual period was 07/17/2021 (approximate).  Health Maintenance Due  Topic Date Due   COVID-19 Vaccine (1) Never done   Pneumococcal Vaccine 51-69 Years old (1 - PCV) Never done   HPV VACCINES (1 - 2-dose series) Never done   PAP-Cervical Cytology Screening  Never done   INFLUENZA VACCINE  Never done     The following portions of the patient's history were reviewed and updated as appropriate: allergies, current medications, past family history, past medical history, past social history, past surgical history and problem list.  Review of Systems Pertinent items are noted in HPI.   Objective:  BP 120/78    Ht 5' 6"  (1.676 m)    Wt 230 lb 12.8 oz (104.7 kg)    LMP 07/17/2021 (Approximate)    BMI 37.25 kg/m  Gen: well appearing, NAD HEENT: no scleral icterus CV: RR Lung: Normal WOB Ext: warm well perfused     Assessment and Plan:   Contraception counseling: Reviewed all forms of birth control options in the tiered based approach. available including abstinence; over the counter/barrier methods; hormonal contraceptive medication including pill, patch, ring, injection,contraceptive implant,  ECP; hormonal and nonhormonal IUDs; permanent sterilization options including vasectomy and the various tubal sterilization modalities. Risks, benefits, and typical effectiveness rates were reviewed.  Questions were answered.  Written information was also given to the patient to review.  Patient desires ocp's, this was prescribed for patient. She will follow up in  prn for surveillance.  She was told to call with any further questions, or with any concerns about this method of contraception.  Emphasized use of condoms 100% of the time for STI prevention.  Patient was not offered ECP due to not meeting criteria . ECP was not accepted by the patient. ECP counseling was not given - see RN documentation  1. Family planning Pt states she has had 2 pregnancy tests--last one 11/08/21 at home=neg.  2. Encounter for initial prescription of contraceptive pills E-rx [facility] Sprinte[facility] until 07/2022 Please counsel on need to take daily at same time and abstinance/back up condoms next 7 days    Please refer to After Visit Summary for other counseling recommendations.   No follow-ups on file.  Herbie Saxon, [place]

## 2021-12-03 DIAGNOSIS — Z419 Encounter for procedure for purposes other than remedying health state, unspecified: Secondary | ICD-10-CM | POA: Diagnosis not present

## 2021-12-12 ENCOUNTER — Ambulatory Visit: Payer: Medicaid Other

## 2021-12-24 ENCOUNTER — Other Ambulatory Visit: Payer: Self-pay | Admitting: Internal Medicine

## 2021-12-24 ENCOUNTER — Encounter: Payer: Self-pay | Admitting: Emergency Medicine

## 2021-12-24 ENCOUNTER — Emergency Department
Admission: EM | Admit: 2021-12-24 | Discharge: 2021-12-24 | Disposition: A | Discharge status: Home / Self Care | Payer: Medicaid Other | Attending: Emergency Medicine | Admitting: Emergency Medicine

## 2021-12-24 ENCOUNTER — Emergency Department: Payer: Medicaid Other

## 2021-12-24 ENCOUNTER — Other Ambulatory Visit: Payer: Self-pay

## 2021-12-24 DIAGNOSIS — J4541 Moderate persistent asthma with (acute) exacerbation: Secondary | ICD-10-CM | POA: Diagnosis not present

## 2021-12-24 DIAGNOSIS — R0602 Shortness of breath: Secondary | ICD-10-CM | POA: Diagnosis not present

## 2021-12-24 DIAGNOSIS — J45909 Unspecified asthma, uncomplicated: Secondary | ICD-10-CM | POA: Diagnosis not present

## 2021-12-24 LAB — POC URINE PREG, ED: Preg Test, Ur: NEGATIVE

## 2021-12-24 MED ORDER — PREDNISONE 10 MG PO TABS: ORAL_TABLET | ORAL | 0 refills | Status: DC

## 2021-12-24 MED ORDER — FLUTICASONE-SALMETEROL 100-50 MCG/ACT IN AEPB: 1.0000 | INHALATION_SPRAY | Freq: Two times a day (BID) | RESPIRATORY_TRACT | 0 refills | Status: DC

## 2021-12-24 MED ORDER — ALBUTEROL SULFATE (2.5 MG/3ML) 0.083% IN NEBU
2.5000 mg | INHALATION_SOLUTION | Freq: Once | RESPIRATORY_TRACT | Status: AC
  Administered 2021-12-24: 2.5 mg via RESPIRATORY_TRACT
  Filled 2021-12-24: qty 3

## 2021-12-24 MED ORDER — ALBUTEROL SULFATE HFA 108 (90 BASE) MCG/ACT IN AERS: 2.0000 | INHALATION_SPRAY | Freq: Four times a day (QID) | RESPIRATORY_TRACT | 0 refills | Status: DC | PRN

## 2021-12-24 NOTE — ED Provider Notes (Signed)
[facility] Provider Note    Event Date/Time   First MD Initiated Contact with Patient 12/24/21 1713     (approximate)   History   Shortness of Breath   HPI  Kathryn Nash is a 26 y.o. female presents to the ER today with complaint of runny nose, nasal congestion, cough and shortness of breath.  She reports this started 1 month ago but seem to get worse today.  She reports she is blowing yellow mucus out of her nose.  The cough is productive of clear mucus.  She has been wheezing.  She denies headache, ear pain, sore throat, chest pain, fever, chills or body aches.  She has a history of allergies for which she takes Cetirizine and Fluticasone nasal spray.  She has a history of moderate, persistent asthma for which she typically takes Wixela and Albuterol for relief however she reports she has been out of these inhalers for some time and cannot get in with her pulmonologist until March.  She does not smoke.  She denies known exposure to COVID or flu.     Physical Exam   Triage Vital Signs: ED Triage Vitals  Enc Vitals Group     BP 12/24/21 1707 131/67     Pulse Rate 12/24/21 1707 81     Resp 12/24/21 1707 20     Temp 12/24/21 1707 98.3 F (36.8 C)     Temp Source 12/24/21 1707 Oral     SpO2 12/24/21 1707 98 %     Weight 12/24/21 1705 210 lb (95.3 kg)     Height 12/24/21 1705 5\' 6"  (1.676 m)     Head Circumference --      Peak Flow --      Pain Score 12/24/21 1705 0     Pain Loc --      Pain Edu? --      Excl. in GC? --     Most recent vital signs: Vitals:   12/24/21 1707  BP: 131/67  Pulse: 81  Resp: 20  Temp: 98.3 F (36.8 C)  SpO2: 98%     General: Awake, no distress.  CV:  Good peripheral perfusion.  Resp:  Normal effort.  Bilateral expiratory wheezing noted.  No rales or rhonchi noted    ED Results / Procedures / Treatments    RADIOLOGY Imaging Orders         DG Chest 2 View    IMPRESSION: No active cardiopulmonary  disease.    MEDICATIONS ORDERED IN ED: Medications  albuterol (PROVENTIL) (2.5 MG/3ML) 0.083% nebulizer solution 2.5 mg (2.5 mg Nebulization Given 12/24/21 1750)     IMPRESSION / MDM / ASSESSMENT AND PLAN / ED COURSE  I reviewed the triage vital signs and the nursing notes.  Nasal Congestion, Cough, SOB, Hx of Asthma and Allergies:   Differential diagnosis includes but is not limited to, viral sinusitis, bacterial sinusitis, viral URI with cough, viral bronchitis, asthma exacerbation  Albuterol neb given her persistent wheezing Urine pregnancy negative Chest x-ray negative per radiology impression.  Upon my review no infiltrate or pulmonary edema Rx for Pred taper x6 days Rx for Wixela inhaler 1 puff BID Rx for Albuterol inhaler, 1-2 puffs Q4-6H as needed for wheezing or SOB She plans to follow-up with a PCP and pulmonology as an outpatient   FINAL CLINICAL IMPRESSION(S) / ED DIAGNOSES   Final diagnoses:  Moderate persistent asthma with exacerbation     Rx / DC Orders  ED Discharge Orders          Ordered    predniSONE (DELTASONE) 10 MG tablet        12/24/21 1804    fluticasone-salmeterol (WIXELA INHUB) 100-50 MCG/ACT AEPB  2 times daily        12/24/21 1804    albuterol (VENTOLIN HFA) 108 (90 Base) MCG/ACT inhaler  Every 6 hours PRN        12/24/21 1804             Note:  This document was prepared using Dragon voice recognition software and may include unintentional dictation errors.    Lorre Munroe, NP 12/24/21 1807    Chesley Noon, MD 12/24/21 Jerene Bears

## 2021-12-24 NOTE — ED Triage Notes (Signed)
Pt reports has asthma but does not have any inhalers left and she has not been able to get in with her MD to get refills on her inhalers. Pt reports asthma acting up

## 2021-12-24 NOTE — ED Notes (Signed)
Pt verbalizes understanding of d/c instructions, medications and follow up 

## 2021-12-24 NOTE — Discharge Instructions (Signed)
You were seen today for cough and shortness of breath.  Your chest x-ray did not show any evidence of bronchitis or pneumonia.  I have sent in prednisone to your pharmacy for you to take for the next 6 days.  I refilled your Wixela and albuterol inhalers.  Please follow-up with a PCP or pulmonology as an outpatient

## 2022-01-03 DIAGNOSIS — Z419 Encounter for procedure for purposes other than remedying health state, unspecified: Secondary | ICD-10-CM | POA: Diagnosis not present

## 2022-01-08 ENCOUNTER — Ambulatory Visit: Payer: Medicaid Other | Admitting: Nurse Practitioner

## 2022-01-08 ENCOUNTER — Other Ambulatory Visit: Payer: Self-pay

## 2022-01-08 ENCOUNTER — Encounter: Payer: Self-pay | Admitting: Nurse Practitioner

## 2022-01-08 DIAGNOSIS — B9689 Other specified bacterial agents as the cause of diseases classified elsewhere: Secondary | ICD-10-CM

## 2022-01-08 DIAGNOSIS — Z113 Encounter for screening for infections with a predominantly sexual mode of transmission: Secondary | ICD-10-CM

## 2022-01-08 LAB — PREGNANCY, URINE: Preg Test, Ur: NEGATIVE

## 2022-01-08 LAB — WET PREP FOR TRICH, YEAST, CLUE
Trichomonas Exam: NEGATIVE
Yeast Exam: NEGATIVE

## 2022-01-08 MED ORDER — METRONIDAZOLE 500 MG PO TABS: 500.0000 mg | ORAL_TABLET | Freq: Two times a day (BID) | ORAL | 0 refills | Status: AC

## 2022-01-08 NOTE — Progress Notes (Signed)
[facility] Department  STI clinic/screening visit [address] [place] [address] [phone]  Subjective:  Kathryn Nash is a 26 y.o. female being seen today for an STI screening visit. The patient reports they do not have symptoms.  Patient reports that they do not desire a pregnancy in the next year.   They reported they are not interested in discussing contraception today.    Patient's last menstrual period was 12/19/2021 (exact date).   Patient has the following medical conditions:   Patient Active Problem List   Diagnosis Date Noted   Morbid obesity (HCC) 220 lbs BMI=37.2 10/20/2019   Asthma exacerbation 12/08/2018    Chief Complaint  Patient presents with   SEXUALLY TRANSMITTED DISEASE    screening    HPI  Patient reports to clinic today for a STD screening.   Last HIV test per patient/review of record was 07/06/2021 Patient reports last pap was 07/06/2021.   Screening for MPX risk: Does the patient have an unexplained rash? No Is the patient MSM? No Does the patient endorse multiple sex partners or anonymous sex partners? No Did the patient have close or sexual contact with a person diagnosed with MPX? No Has the patient traveled outside the [place] where MPX is endemic? No Is there a high clinical suspicion for MPX-- evidenced by one of the following No  -Unlikely to be chickenpox  -Lymphadenopathy  -Rash that present in same phase of evolution on any given body part See flowsheet for further details and programmatic requirements.    The following portions of the patient's history were reviewed and updated as appropriate: allergies, current medications, past medical history, past social history, past surgical history and problem list.  Objective:  There were no vitals filed for this visit.  Physical Exam Constitutional:      Appearance: Normal appearance.  HENT:     Head: Normocephalic.     Nose: Nose normal.     Mouth/Throat:      Comments: No visible signs of dental caries.  Patient reports a recent dental exam.  Pulmonary:     Effort: Pulmonary effort is normal.  Abdominal:     General: Abdomen is flat.     Palpations: Abdomen is soft.  Genitourinary:    Comments: External genitalia/pubic area without nits, lice, edema, erythema, lesions and inguinal adenopathy. Vagina with normal mucosa and discharge. Cervix without visible lesions. Uterus firm, mobile, nt, no masses, no CMT, no adnexal tenderness or fullness. pH >4.5.    Musculoskeletal:     Cervical back: Full passive range of motion without pain, normal range of motion and neck supple.  Skin:    General: Skin is warm and dry.  Neurological:     Mental Status: She is alert and oriented to person, place, and time.  Psychiatric:        Attention and Perception: Attention normal.        Mood and Affect: Mood normal.        Speech: Speech normal.        Behavior: Behavior is cooperative.     Assessment and Plan:  Kathryn Nash is a 26 y.o. female presenting to the [facility] Department for STI screening  1. Screening examination for venereal disease -Patient accepted all screenings including vaginal CT/GC and declines bloodwork for HIV/RPR.  -Patient desires urine pregnancy test today.  Patient meets criteria for HepB screening? No. Ordered? No - patient declines Patient meets criteria for HepC  screening? No. Ordered? No - patient declines   Treat wet prep per standing order Discussed time line for [facility] results and that patient will be called with positive results and encouraged patient to call if she had not heard in 2 weeks.  Counseled to return or seek care for continued or worsening symptoms Recommended condom use with all sex  Patient is currently using Hormonal Contraception: Injection, Rings and Patches to prevent pregnancy.    - Chlamydia/Gonorrhea [facility] - WET PREP FOR TRICH, YEAST, CLUE - Pregnancy,  urine  2. Bacterial vaginosis -Wet mount reviewed.  Treat patient for BV today.  - metroNIDAZOLE (FLAGYL) 500 MG tablet; Take 1 tablet (500 mg total) by mouth 2 (two) times daily for 7 days.  Dispense: 14 tablet; Refill: 0     Return if symptoms worsen or fail to improve.  Future Appointments  Date Time Provider Department Center  01/08/2022  3:20 PM Glenna Fellows, FNP [facility] None    Glenna Fellows, FNP

## 2022-01-08 NOTE — Progress Notes (Signed)
Pt here for STD screening.  Wet mount results reviewed and medication dispensed per Provider orders.  Pt declined condoms. Kayron Kalmar M Simpson Paulos, RN  

## 2022-01-14 ENCOUNTER — Other Ambulatory Visit: Payer: Self-pay | Admitting: Internal Medicine

## 2022-01-15 NOTE — Telephone Encounter (Signed)
Requested medications are due for refill today.  yes  Requested medications are on the active medications list.  yes  Last refill. 12/24/2021 8/0  Future visit scheduled.   no  Notes to clinic.  No pcp listed.     Requested Prescriptions  Pending Prescriptions Disp Refills   VENTOLIN HFA 108 (90 Base) MCG/ACT inhaler [Pharmacy Med Name: VENTOLIN HFA 90 MCG INHALER] 18 each     Sig: TAKE 2 PUFFS BY MOUTH EVERY 6 HOURS AS NEEDED FOR WHEEZE OR SHORTNESS OF BREATH     Pulmonology:  Beta Agonists 2 Failed - 01/14/2022  1:01 PM      Failed - Valid encounter within last 12 months    Recent Outpatient Visits   None            Passed - Last BP in normal range    BP Readings from Last 1 Encounters:  12/24/21 131/67          Passed - Last Heart Rate in normal range    Pulse Readings from Last 1 Encounters:  12/24/21 81

## 2022-01-31 DIAGNOSIS — Z419 Encounter for procedure for purposes other than remedying health state, unspecified: Secondary | ICD-10-CM | POA: Diagnosis not present

## 2022-02-05 ENCOUNTER — Ambulatory Visit: Payer: Medicaid Other

## 2022-02-20 ENCOUNTER — Ambulatory Visit: Payer: Medicaid Other

## 2022-02-25 ENCOUNTER — Other Ambulatory Visit: Payer: Self-pay | Admitting: Internal Medicine

## 2022-02-27 NOTE — Telephone Encounter (Signed)
Requested medication (s) are due for refill today: Yes ? ?Requested medication (s) are on the active medication list: Yes ? ?Last refill:  12/24/21 ? ?Future visit scheduled: No ? ?Notes to clinic:  Do not see any office visits. ? ? ? ?Requested Prescriptions  ?Pending Prescriptions Disp Refills  ? ADVAIR DISKUS 100-50 MCG/ACT AEPB [Pharmacy Med Name: ADVAIR 100-50 DISKUS] 60 each   ?  Sig: INHALE 1 PUFF INTO THE LUNGS TWICE A DAY  ?  ? Pulmonology:  Combination Products Failed - 02/25/2022  3:01 PM  ?  ?  Failed - Valid encounter within last 12 months  ?  Recent Outpatient Visits   ?None ?  ?  ? ?  ?  ?  ? VENTOLIN HFA 108 (90 Base) MCG/ACT inhaler [Pharmacy Med Name: VENTOLIN HFA 90 MCG INHALER] 18 each   ?  Sig: TAKE 2 PUFFS BY MOUTH EVERY 6 HOURS AS NEEDED FOR WHEEZE OR SHORTNESS OF BREATH  ?  ? Pulmonology:  Beta Agonists 2 Failed - 02/25/2022  3:01 PM  ?  ?  Failed - Valid encounter within last 12 months  ?  Recent Outpatient Visits   ?None ?  ?  ? ?  ?  ?  Passed - Last BP in normal range  ?  BP Readings from Last 1 Encounters:  ?12/24/21 131/67  ?  ?  ?  ?  Passed - Last Heart Rate in normal range  ?  Pulse Readings from Last 1 Encounters:  ?12/24/21 81  ?  ?  ?  ?  ? ?

## 2022-03-01 ENCOUNTER — Emergency Department: Payer: Medicaid Other

## 2022-03-01 ENCOUNTER — Other Ambulatory Visit: Payer: Self-pay

## 2022-03-01 ENCOUNTER — Encounter: Payer: Self-pay | Admitting: Emergency Medicine

## 2022-03-01 ENCOUNTER — Emergency Department
Admission: EM | Admit: 2022-03-01 | Discharge: 2022-03-01 | Disposition: A | Discharge status: Home / Self Care | Payer: Medicaid Other | Attending: Emergency Medicine | Admitting: Emergency Medicine

## 2022-03-01 DIAGNOSIS — R0602 Shortness of breath: Secondary | ICD-10-CM | POA: Diagnosis not present

## 2022-03-01 DIAGNOSIS — N390 Urinary tract infection, site not specified: Secondary | ICD-10-CM | POA: Diagnosis not present

## 2022-03-01 DIAGNOSIS — J4530 Mild persistent asthma, uncomplicated: Secondary | ICD-10-CM | POA: Diagnosis not present

## 2022-03-01 DIAGNOSIS — R109 Unspecified abdominal pain: Secondary | ICD-10-CM | POA: Diagnosis present

## 2022-03-01 DIAGNOSIS — Z20822 Contact with and (suspected) exposure to covid-19: Secondary | ICD-10-CM | POA: Insufficient documentation

## 2022-03-01 LAB — COMPREHENSIVE METABOLIC PANEL
ALT: 16 U/L (ref 0–44)
AST: 17 U/L (ref 15–41)
Albumin: 3.8 g/dL (ref 3.5–5.0)
Alkaline Phosphatase: 66 U/L (ref 38–126)
Anion gap: 4 — ABNORMAL LOW (ref 5–15)
BUN: 11 mg/dL (ref 6–20)
CO2: 28 mmol/L (ref 22–32)
Calcium: 8.5 mg/dL — ABNORMAL LOW (ref 8.9–10.3)
Chloride: 105 mmol/L (ref 98–111)
Creatinine, Ser: 0.83 mg/dL (ref 0.44–1.00)
GFR, Estimated: >60 mL/min (ref >60)
Glucose, Bld: 115 mg/dL — ABNORMAL HIGH (ref 70–99)
Potassium: 3.7 mmol/L (ref 3.5–5.1)
Sodium: 137 mmol/L (ref 135–145)
Total Bilirubin: 0.7 mg/dL (ref 0.3–1.2)
Total Protein: 7.9 g/dL (ref 6.5–8.1)

## 2022-03-01 LAB — CBC
HCT: 39.7 % (ref 36.0–46.0)
Hemoglobin: 13.1 g/dL (ref 12.0–15.0)
MCH: 29.8 pg (ref 26.0–34.0)
MCHC: 33 g/dL (ref 30.0–36.0)
MCV: 90.2 fL (ref 80.0–100.0)
Platelets: 478 10*3/uL — ABNORMAL HIGH (ref 150–400)
RBC: 4.4 MIL/uL (ref 3.87–5.11)
RDW: 12.7 % (ref 11.5–15.5)
WBC: 16.4 10*3/uL — ABNORMAL HIGH (ref 4.0–10.5)
nRBC: 0 % (ref 0.0–0.2)

## 2022-03-01 LAB — URINALYSIS, ROUTINE W REFLEX MICROSCOPIC
Bilirubin Urine: NEGATIVE
Glucose, UA: NEGATIVE mg/dL
Ketones, ur: NEGATIVE mg/dL
Nitrite: NEGATIVE
Protein, ur: NEGATIVE mg/dL
Specific Gravity, Urine: 1.016 (ref 1.005–1.030)
WBC, UA: >50 WBC/hpf — ABNORMAL HIGH (ref 0–5)
pH: 5 (ref 5.0–8.0)

## 2022-03-01 LAB — RESP PANEL BY RT-PCR (FLU A&B, COVID) ARPGX2
Influenza A by PCR: NEGATIVE
Influenza B by PCR: NEGATIVE
SARS Coronavirus 2 by RT PCR: NEGATIVE

## 2022-03-01 LAB — POC URINE PREG, ED: Preg Test, Ur: NEGATIVE

## 2022-03-01 LAB — LIPASE, BLOOD: Lipase: 26 U/L (ref 11–51)

## 2022-03-01 MED ORDER — CEFADROXIL 500 MG PO CAPS
1000.0000 mg | ORAL_CAPSULE | ORAL | Status: AC
  Administered 2022-03-01: 1000 mg via ORAL
  Filled 2022-03-01: qty 2

## 2022-03-01 MED ORDER — CEFADROXIL 500 MG PO CAPS: 1000.0000 mg | ORAL_CAPSULE | Freq: Two times a day (BID) | ORAL | 0 refills | Status: AC

## 2022-03-01 MED ORDER — ALBUTEROL SULFATE HFA 108 (90 BASE) MCG/ACT IN AERS: INHALATION_SPRAY | RESPIRATORY_TRACT | 0 refills | Status: DC

## 2022-03-01 NOTE — ED Triage Notes (Signed)
Pt to ED from home c/o asthma for a couple weeks and not getting better.  Hx of asthma and uses inhaler at home without relief.  Denies fevers, states congested non productive cough.  Discomfort in her chest when coughing.   ? ?Pt also requesting to be seen for possible UTI, states lower abd pain for several days and urinary frequency. ?

## 2022-03-01 NOTE — Discharge Instructions (Addendum)
Please take the full course of treatment as prescribed to treat your urinary tract infection.  Use the prescribed asthma inhaler as needed.  We recommend you call [facility] to set up an appointment to establish a primary care provider.  You can also follow-up with the other clinic you were considering but it would be better to set up an appointment at the next available opportunity. ? ?  Return to the emergency department if you develop new or worsening symptoms that concern you. ?

## 2022-03-01 NOTE — ED Provider Notes (Signed)
? ?[facility] ?Provider Note ? ? ? Event Date/Time  ? First MD Initiated Contact with Patient 03/01/22 0507   ?  (approximate) ? ? ?History  ? ?Asthma, Abdominal Pain, and Urinary Frequency ? ? ?HPI ? ?Kathryn Nash is a 26 y.o. female with prior medical history of asthma and obesity.  She presents for evaluation of persistent asthma, worse at night when she tries to sleep.  No substantial difficulty breathing during the day.  She said that she has not been on antibiotics recently and she has run out of her inhaler.  No recent steroids. ? ?She also reports that for the last few days she has been having increased urinary frequency, dark and cloudy urine, and some pain in her lower back.  She is concerned about the possible urinary tract infection. ? ?She denies fever, sore throat, chest pain, shortness of breath, nausea, vomiting, and abdominal pain. ?  ? ? ?Physical Exam  ? ?Triage Vital Signs: ?ED Triage Vitals  ?Enc Vitals Group  ?   BP 03/01/22 0216 124/78  ?   Pulse Rate 03/01/22 0216 66  ?   Resp 03/01/22 0216 20  ?   Temp 03/01/22 0216 98 ?F (36.7 ?C)  ?   Temp Source 03/01/22 0216 Oral  ?   SpO2 03/01/22 0216 96 %  ?   Weight 03/01/22 0218 104.3 kg (230 lb)  ?   Height 03/01/22 0218 1.676 m (5\' 6" )  ?   Head Circumference --   ?   Peak Flow --   ?   Pain Score 03/01/22 0217 7  ?   Pain Loc --   ?   Pain Edu? --   ?   Excl. in GC? --   ? ? ?Most recent vital signs: ?Vitals:  ? 03/01/22 0518 03/01/22 0644  ?BP: 119/77 115/68  ?Pulse: 68 67  ?Resp: 20 20  ?Temp:    ?SpO2: 98% 98%  ? ? ? ?General: Awake, no distress.  ?CV:  Good peripheral perfusion.  ?Resp:  Normal effort.  Lungs are clear to auscultation bilaterally with no wheezing nor coarse breath sounds. ?Abd:  No distention.  No tenderness to palpation. ?Other:  No tenderness to percussion on either flank. ? ? ?ED Results / Procedures / Treatments  ? ?Labs ?(all labs ordered are listed, but only abnormal results are displayed) ?Labs  Reviewed  ?COMPREHENSIVE METABOLIC PANEL - Abnormal; Notable for the following components:  ?    Result Value  ? Glucose, Bld 115 (*)   ? Calcium 8.5 (*)   ? Anion gap 4 (*)   ? All other components within normal limits  ?CBC - Abnormal; Notable for the following components:  ? WBC 16.4 (*)   ? Platelets 478 (*)   ? All other components within normal limits  ?URINALYSIS, ROUTINE W REFLEX MICROSCOPIC - Abnormal; Notable for the following components:  ? Color, Urine YELLOW (*)   ? APPearance CLOUDY (*)   ? Hgb urine dipstick SMALL (*)   ? Leukocytes,Ua LARGE (*)   ? WBC, UA >50 (*)   ? Bacteria, UA RARE (*)   ? All other components within normal limits  ?RESP PANEL BY RT-PCR (FLU A&B, COVID) ARPGX2  ?URINE CULTURE  ?LIPASE, BLOOD  ?POC URINE PREG, ED  ? ? ? ? ?RADIOLOGY ?I personally reviewed the patient's two-view chest x-ray.  There is no evidence of any acute abnormality including no lobar pneumonia and no pneumothorax,  no interstitial edema.  Radiologist agrees with the negative interpretation. ? ? ? ?PROCEDURES: ? ?Critical Care performed: No ? ?Procedures ? ? ?MEDICATIONS ORDERED IN ED: ?Medications  ?cefadroxil (DURICEF) capsule 1,000 mg (1,000 mg Oral Given 03/01/22 0640)  ? ? ? ?IMPRESSION / MDM / ASSESSMENT AND PLAN / ED COURSE  ?I reviewed the triage vital signs and the nursing notes. ?             ?               ? ?Differential diagnosis includes, but is not limited to, asthma, UTI, pyelonephritis, STD/TOA, other acute intra-abdominal infection, pneumonia, viral respiratory illness. ? ?I reviewed the patient's vital signs and they are stable and within normal limits with no tachycardia, fever, appropriate blood pressure, no tachypnea, no hypoxia. ? ?As documented above I reviewed the patient's chest x-ray shows no sign of acute abnormality. ? ?Labs ordered initially include CMP, respiratory viral panel, urine pregnancy test, urinalysis, lipase, and CBC. ? ?I reviewed the results.  Patient has a  leukocytosis of 16.4, negative urine pregnancy, negative lipase, positive urinalysis with what appears to be a clear urinary tract infection (urine culture pending), negative respiratory viral panel, and an essentially normal CMP. ? ?The patient's physical exam is reassuring with no tenderness to palpation and no tenderness to percussion of the flank.  No indication of pyelonephritis.  As per recent pharmacological recommendations, I am putting her on cefadroxil for 10 days, 1000 mg p.o. twice daily for 10 days. ? ?No indication of acute asthma exacerbation.  Prescribing albuterol inhaler.  Strongly encouraged patient to establish primary care provider.  Given usual and customary return precautions.  She understands and agrees with the plan. ? ? ? ? ?  ? ? ?FINAL CLINICAL IMPRESSION(S) / ED DIAGNOSES  ? ?Final diagnoses:  ?Urinary tract infection without hematuria, site unspecified  ?Mild persistent asthma without complication  ? ? ? ?Rx / DC Orders  ? ?ED Discharge Orders   ? ?      Ordered  ?  albuterol (VENTOLIN HFA) 108 (90 Base) MCG/ACT inhaler       ?Note to Pharmacy: Pharmacy may substitute brand and size for insurance-approved equivalent  ? 03/01/22 0611  ?  cefadroxil (DURICEF) 500 MG capsule  2 times daily       ? 03/01/22 1950  ? ?  ?  ? ?  ? ? ? ?Note:  This document was prepared using Dragon voice recognition software and may include unintentional dictation errors. ?  ?Loleta Rose, MD ?03/01/22 [phone] ? ?

## 2022-03-03 DIAGNOSIS — Z419 Encounter for procedure for purposes other than remedying health state, unspecified: Secondary | ICD-10-CM | POA: Diagnosis not present

## 2022-03-03 LAB — URINE CULTURE: Culture: >=100000 — AB

## 2022-03-05 ENCOUNTER — Ambulatory Visit: Payer: Medicaid Other

## 2022-03-07 ENCOUNTER — Encounter: Payer: Self-pay | Admitting: Nurse Practitioner

## 2022-03-07 ENCOUNTER — Ambulatory Visit: Payer: Medicaid Other | Admitting: Nurse Practitioner

## 2022-03-07 DIAGNOSIS — Z113 Encounter for screening for infections with a predominantly sexual mode of transmission: Secondary | ICD-10-CM

## 2022-03-07 NOTE — Progress Notes (Signed)
Patient recently went to the ED on 03/01/22 for signs and symptoms of a UTI.  Patient was prescribed Cefadroxil 500 MG BID x 10 days.  Patient currently taking antibiotics.  Patient informed that if currently on antibiotics there is a potential risk for inaccuracy of test results .  Patient currently not having symptoms.  Advised to continue to take and complete all medication.  Patient to return to clinic in the next couple of weeks if screening is desired.  Glenna Fellows, FNP  ?

## 2022-03-18 ENCOUNTER — Encounter: Payer: Self-pay | Admitting: Emergency Medicine

## 2022-03-18 ENCOUNTER — Ambulatory Visit
Admission: EM | Admit: 2022-03-18 | Discharge: 2022-03-18 | Disposition: A | Discharge status: Home / Self Care | Payer: Medicaid Other | Attending: Family Medicine | Admitting: Family Medicine

## 2022-03-18 DIAGNOSIS — N760 Acute vaginitis: Secondary | ICD-10-CM | POA: Diagnosis not present

## 2022-03-18 DIAGNOSIS — R300 Dysuria: Secondary | ICD-10-CM

## 2022-03-18 DIAGNOSIS — N3000 Acute cystitis without hematuria: Secondary | ICD-10-CM

## 2022-03-18 LAB — POCT URINALYSIS DIP (MANUAL ENTRY)
Blood, UA: NEGATIVE
Glucose, UA: NEGATIVE mg/dL
Nitrite, UA: NEGATIVE
Spec Grav, UA: 1.025 (ref 1.010–1.025)
Urobilinogen, UA: 1 E.U./dL
pH, UA: 7 (ref 5.0–8.0)

## 2022-03-18 LAB — POCT URINE PREGNANCY: Preg Test, Ur: NEGATIVE

## 2022-03-18 MED ORDER — CEPHALEXIN 250 MG/5ML PO SUSR: 500.0000 mg | Freq: Two times a day (BID) | ORAL | 0 refills | Status: AC

## 2022-03-18 MED ORDER — FLUCONAZOLE 150 MG PO TABS: 150.0000 mg | ORAL_TABLET | ORAL | 0 refills | Status: DC

## 2022-03-18 NOTE — ED Provider Notes (Signed)
?[facility] ? ? ? ?CSN: [id number] ?Arrival date & time: 03/18/22  [id number] ? ? ?  ? ?History   ?Chief Complaint ?Chief Complaint  ?Patient presents with  ? Vaginal Discharge  ? Vaginal Itching  ? ? ?HPI ?Kathryn Nash is a 26 y.o. female.  ? ?HPI ?Patient presents today with vaginal itching and discharge.  Patient was seen in the ER on 4 1 diagnosed with a UTI and treated with a cephalosporin for total of 10 days.  Her urine culture came back positive for E. coli.  She endorses that she did not completely feel better after completing the medication.  She is unable to swallow capsules and had to take only the powder component of the medication due to being unable to swallow the capsule for treatment of UTI. She denies fever, flank pain, nausea or vomiting. ? ?Past Medical History:  ?Diagnosis Date  ? Asthma   ? ? ?Patient Active Problem List  ? Diagnosis Date Noted  ? Morbid obesity ([facility]) 220 lbs BMI=37.2 10/20/2019  ? Asthma exacerbation 12/08/2018  ? ? ?Past Surgical History:  ?Procedure Laterality Date  ? NO PAST SURGERIES    ? ? ?OB History   ? ? Gravida  ?2  ? Para  ?   ? Term  ?   ? Preterm  ?   ? AB  ?1  ? Living  ?0  ?  ? ? SAB  ?1  ? IAB  ?   ? Ectopic  ?   ? Multiple  ?   ? Live Births  ?   ?   ?  ?  ? ? ? ?Home Medications   ? ?Prior to Admission medications   ?Medication Sig Start Date End Date Taking? Authorizing Provider  ?cephALEXin (KEFLEX) 250 MG/5ML suspension Take 10 mLs (500 mg total) by mouth 2 (two) times daily for 5 days. 03/18/22 03/23/22 Yes Scot Jun, FNP  ?fluconazole (DIFLUCAN) 150 MG tablet Take 1 tablet (150 mg total) by mouth every 3 (three) days. Repeat if needed 03/18/22  Yes Scot Jun, FNP  ?Norgestimate-Ethinyl Estradiol Triphasic (TRI-LO-SPRINTEC) 0.18/0.215/0.25 MG-25 MCG tab Take 1 tablet by mouth daily. 11/15/21  Yes Herbie Saxon, CNM  ?albuterol (VENTOLIN HFA) 108 (90 Base) MCG/ACT inhaler Inhale 2-4 puffs by mouth every 4 hours as needed for  wheezing, cough, and/or shortness of breath ?Patient not taking: Reported on 03/07/2022 03/01/22   Hinda Kehr, MD  ?fluticasone-salmeterol (ADVAIR) 100-50 MCG/ACT AEPB Inhale 1 puff into the lungs 2 (two) times daily. ?Patient not taking: Reported on 03/07/2022 12/25/21   Jearld Fenton, NP  ?predniSONE (DELTASONE) 10 MG tablet Take 6 tabs on day 1, 5 tabs on day 2, 4 tabs on day 3, 3 tabs on day 4, 2 tabs on day 5, 1 tab on day 6 ?Patient not taking: Reported on 03/07/2022 12/24/21   Jearld Fenton, NP  ? ? ?Family History ?Family History  ?Problem Relation Age of Onset  ? Asthma Mother   ? Asthma Father   ? ? ?Social History ?Social History  ? ?Tobacco Use  ? Smoking status: Never  ? Smokeless tobacco: Never  ? Tobacco comments:  ?  Never used e-cigarettes  ?Vaping Use  ? Vaping Use: Never used  ?Substance Use Topics  ? Alcohol use: Not Currently  ?  Alcohol/week: 1.0 standard drink  ?  Types: 1 Shots of liquor per week  ? Drug use: Never  ? ? ? ?  Allergies   ?Eggs or egg-derived products, Iohexol, Montelukast sodium, and Penicillins ? ? ?Review of Systems ?Review of Systems ?Pertinent negatives listed in HPI  ?Physical Exam ?Triage Vital Signs ?ED Triage Vitals  ?Enc Vitals Group  ?   BP 03/18/22 0853 120/76  ?   Pulse Rate 03/18/22 0853 78  ?   Resp 03/18/22 0853 18  ?   Temp 03/18/22 0853 98.6 ?F (37 ?C)  ?   Temp Source 03/18/22 0853 Oral  ?   SpO2 03/18/22 0853 97 %  ?   Weight --   ?   Height --   ?   Head Circumference --   ?   Peak Flow --   ?   Pain Score 03/18/22 0852 0  ?   Pain Loc --   ?   Pain Edu? --   ?   Excl. in [place]? --   ? ?No data found. ? ?Updated Vital Signs ?BP 120/76 (BP Location: Right Arm)   Pulse 78   Temp 98.6 ?F (37 ?C) (Oral)   Resp 18   SpO2 97%  ? ?Visual Acuity ?Right Eye Distance:   ?Left Eye Distance:   ?Bilateral Distance:   ? ?Right Eye Near:   ?Left Eye Near:    ?Bilateral Near:    ? ?Physical Exam ?Constitutional: Patient appears well-developed and well-nourished. No  distress. ?HENT: Normocephalic, atraumatic, External right and left ear normal. Oropharynx is clear and moist.  ?Eyes: Conjunctivae and EOM are normal. PERRLA, no scleral icterus. ?Neck: Normal ROM. Neck supple.CVS: RRR, S1/S2 +, no murmurs, no gallops, no carotid bruit.  ?Pulmonary: Effort and breath sounds normal ?Neuro: Alert. Normal reflexes, muscle tone coordination ?Psychiatric: Normal mood and affect. Behavior, judgment, thought content normal.  ?Vaginal cytology self collected  ?UC Treatments / Results  ?Labs ?(all labs ordered are listed, but only abnormal results are displayed) ?Labs Reviewed  ?POCT URINALYSIS DIP (MANUAL ENTRY) - Abnormal; Notable for the following components:  ?    Result Value  ? Clarity, UA cloudy (*)   ? Bilirubin, UA small (*)   ? Ketones, POC UA trace (5) (*)   ? Protein Ur, POC trace (*)   ? Leukocytes, UA Trace (*)   ? All other components within normal limits  ?URINE CULTURE  ?POCT URINE PREGNANCY  ?CERVICOVAGINAL ANCILLARY ONLY  ? ? ?EKG ? ? ?Radiology ?No results found. ? ?Procedures ?Procedures (including critical care time) ? ?Medications Ordered in UC ?Medications - No data to display ? ?Initial Impression / Assessment and Plan / UC Course  ?I have reviewed the triage vital signs and the nursing notes. ? ?Pertinent labs & imaging results that were available during my care of the patient were reviewed by me and considered in my medical decision making (see chart for details). ? ?  ?UA remains significant for leukocytes, protein, ketones.  Suspect urinary tract infection has not completely resolved.  Will culture urine to ensure no additional bacteria is present in urine.  In the meantime I will cover patient for 5 days with Keflex in the suspension form so that patient is able to get a complete course of medication for treatment of her UTI.  Treating vaginitis symptoms with Diflucan.  Vaginal cytology pending. Patient made aware if any additional treatment is warranted we  will contact her by phone and/or MyChart.  Return to clinic as needed. ?Final Clinical Impressions(s) / UC Diagnoses  ? ?Final diagnoses:  ?Dysuria  ?Vaginitis and vulvovaginitis  ?  Acute cystitis without hematuria  ? ? ? ?Discharge Instructions   ? ?  ?I am reculturing your urine as you continue to have bacteria according to the urinalysis here in the clinic.  I will culture the urine to determine if your antibiotics completely clear the infection.  Given your symptoms I am covering you for a few days with a different antibiotic to ensure that we have completely resolved and urinary tract infection.  For your yeast infection I have prescribed you Diflucan you will take 1 tablet every 3 days as needed for any vaginal irritation, yeast or discharge.  Your vaginal cytology analysis will return within 2 to 3 days.  Your results will go directly to [place].  If any additional treatment is needed we will contact you by phone. ? ? ?ED Prescriptions   ? ? Medication Sig Dispense Auth. Provider  ? fluconazole (DIFLUCAN) 150 MG tablet Take 1 tablet (150 mg total) by mouth every 3 (three) days. Repeat if needed 3 tablet Scot Jun, FNP  ? cephALEXin (KEFLEX) 250 MG/5ML suspension Take 10 mLs (500 mg total) by mouth 2 (two) times daily for 5 days. 100 mL Scot Jun, FNP  ? ?  ? ?PDMP not reviewed this encounter. ?  ?Scot Jun, FNP ?03/18/22 [phone] ? ?

## 2022-03-18 NOTE — Discharge Instructions (Addendum)
I am reculturing your urine as you continue to have bacteria according to the urinalysis here in the clinic.  I will culture the urine to determine if your antibiotics completely clear the infection.  Given your symptoms I am covering you for a few days with a different antibiotic to ensure that we have completely resolved and urinary tract infection.  For your yeast infection I have prescribed you Diflucan you will take 1 tablet every 3 days as needed for any vaginal irritation, yeast or discharge.  Your vaginal cytology analysis will return within 2 to 3 days.  Your results will go directly to MyChart.  If any additional treatment is needed we will contact you by phone. ?

## 2022-03-18 NOTE — ED Triage Notes (Signed)
Pt presents with vaginal discharge and itching since yesterday. Pt would also like to be checked for STD.  ?

## 2022-03-19 LAB — URINE CULTURE: Special Requests: NORMAL

## 2022-03-20 ENCOUNTER — Telehealth (HOSPITAL_COMMUNITY): Payer: Self-pay | Admitting: Emergency Medicine

## 2022-03-20 LAB — CERVICOVAGINAL ANCILLARY ONLY
Bacterial Vaginitis (gardnerella): POSITIVE — AB
Candida Glabrata: NEGATIVE
Candida Vaginitis: POSITIVE — AB
Chlamydia: NEGATIVE
Comment: NEGATIVE
Comment: NEGATIVE
Comment: NEGATIVE
Comment: NEGATIVE
Comment: NEGATIVE
Comment: NORMAL
Neisseria Gonorrhea: NEGATIVE
Trichomonas: NEGATIVE

## 2022-03-20 MED ORDER — METRONIDAZOLE 500 MG PO TABS: 500.0000 mg | ORAL_TABLET | Freq: Two times a day (BID) | ORAL | 0 refills | Status: DC

## 2022-03-27 ENCOUNTER — Ambulatory Visit: Payer: Medicaid Other

## 2022-04-02 DIAGNOSIS — Z419 Encounter for procedure for purposes other than remedying health state, unspecified: Secondary | ICD-10-CM | POA: Diagnosis not present

## 2022-04-14 ENCOUNTER — Other Ambulatory Visit: Payer: Self-pay

## 2022-04-14 ENCOUNTER — Emergency Department
Admission: EM | Admit: 2022-04-14 | Discharge: 2022-04-15 | Disposition: A | Discharge status: Home / Self Care | Payer: Medicaid Other | Attending: Emergency Medicine | Admitting: Emergency Medicine

## 2022-04-14 ENCOUNTER — Emergency Department: Payer: Medicaid Other

## 2022-04-14 ENCOUNTER — Encounter: Payer: Self-pay | Admitting: Emergency Medicine

## 2022-04-14 DIAGNOSIS — R102 Pelvic and perineal pain: Secondary | ICD-10-CM | POA: Diagnosis not present

## 2022-04-14 DIAGNOSIS — B9689 Other specified bacterial agents as the cause of diseases classified elsewhere: Secondary | ICD-10-CM

## 2022-04-14 DIAGNOSIS — N760 Acute vaginitis: Secondary | ICD-10-CM | POA: Diagnosis not present

## 2022-04-14 LAB — URINALYSIS, ROUTINE W REFLEX MICROSCOPIC
Bilirubin Urine: NEGATIVE
Glucose, UA: NEGATIVE mg/dL
Hgb urine dipstick: NEGATIVE
Ketones, ur: 5 mg/dL — AB
Leukocytes,Ua: NEGATIVE
Nitrite: NEGATIVE
Protein, ur: NEGATIVE mg/dL
Specific Gravity, Urine: 1.01 (ref 1.005–1.030)
pH: 5 (ref 5.0–8.0)

## 2022-04-14 LAB — COMPREHENSIVE METABOLIC PANEL
ALT: 17 U/L (ref 0–44)
AST: 19 U/L (ref 15–41)
Albumin: 4.5 g/dL (ref 3.5–5.0)
Alkaline Phosphatase: 71 U/L (ref 38–126)
Anion gap: 13 (ref 5–15)
BUN: 11 mg/dL (ref 6–20)
CO2: 22 mmol/L (ref 22–32)
Calcium: 9.5 mg/dL (ref 8.9–10.3)
Chloride: 102 mmol/L (ref 98–111)
Creatinine, Ser: 0.72 mg/dL (ref 0.44–1.00)
GFR, Estimated: >60 mL/min (ref >60)
Glucose, Bld: 101 mg/dL — ABNORMAL HIGH (ref 70–99)
Potassium: 3.9 mmol/L (ref 3.5–5.1)
Sodium: 137 mmol/L (ref 135–145)
Total Bilirubin: 0.6 mg/dL (ref 0.3–1.2)
Total Protein: 8.4 g/dL — ABNORMAL HIGH (ref 6.5–8.1)

## 2022-04-14 LAB — CBC
HCT: 40 % (ref 36.0–46.0)
Hemoglobin: 13.2 g/dL (ref 12.0–15.0)
MCH: 29.3 pg (ref 26.0–34.0)
MCHC: 33 g/dL (ref 30.0–36.0)
MCV: 88.7 fL (ref 80.0–100.0)
Platelets: 408 10*3/uL — ABNORMAL HIGH (ref 150–400)
RBC: 4.51 MIL/uL (ref 3.87–5.11)
RDW: 12.6 % (ref 11.5–15.5)
WBC: 17.2 10*3/uL — ABNORMAL HIGH (ref 4.0–10.5)
nRBC: 0 % (ref 0.0–0.2)

## 2022-04-14 LAB — LIPASE, BLOOD: Lipase: 28 U/L (ref 11–51)

## 2022-04-14 LAB — HCG, QUANTITATIVE, PREGNANCY: hCG, Beta Chain, Quant, S: <1 m[IU]/mL (ref <5)

## 2022-04-14 LAB — WET PREP, GENITAL
Sperm: NONE SEEN
Trich, Wet Prep: NONE SEEN
WBC, Wet Prep HPF POC: <10 (ref <10)
Yeast Wet Prep HPF POC: NONE SEEN

## 2022-04-14 MED ORDER — CEFTRIAXONE SODIUM 1 G IJ SOLR
500.0000 mg | Freq: Once | INTRAMUSCULAR | Status: AC
Start: 2022-04-14 — End: 2022-04-15
  Administered 2022-04-15: 500 mg via INTRAMUSCULAR
  Filled 2022-04-14: qty 10

## 2022-04-14 MED ORDER — HYDROMORPHONE HCL 1 MG/ML IJ SOLN
0.5000 mg | Freq: Once | INTRAMUSCULAR | Status: AC
  Administered 2022-04-14: 0.5 mg via INTRAVENOUS
  Filled 2022-04-14: qty 0.5

## 2022-04-14 MED ORDER — HYDROMORPHONE HCL 1 MG/ML IJ SOLN: 0.5000 mg | Freq: Once | INTRAMUSCULAR | Status: DC

## 2022-04-14 MED ORDER — HYDROMORPHONE HCL 1 MG/ML IJ SOLN
1.0000 mg | Freq: Once | INTRAMUSCULAR | Status: AC
  Administered 2022-04-14: 1 mg via INTRAVENOUS
  Filled 2022-04-14: qty 1

## 2022-04-14 MED ORDER — ONDANSETRON HCL 4 MG/2ML IJ SOLN
4.0000 mg | Freq: Once | INTRAMUSCULAR | Status: AC
  Administered 2022-04-14: 4 mg via INTRAVENOUS
  Filled 2022-04-14: qty 2

## 2022-04-14 NOTE — ED Provider Notes (Signed)
----------------------------------------- ?  11:04 PM on 04/14/2022 ?----------------------------------------- ? ?Assuming care from Dr. Fuller Plan.  In short, Kathryn Nash is a 26 y.o. female with a chief complaint of pelvic pain.  Refer to the original H&P for additional details. ? ?The current plan of care is to follow up on CT scan and/or repeat U/S. ? ? ?Clinical Course as of 04/15/22 0108  ?Sun Apr 15, 2022  ?0105 I personally viewed and interpreted the patient's updated ultrasound and CT scan.  I did not see any evidence of acute abnormality or large ovarian cyst that might be prone to torsion.  The radiologist reported on the CT scan that there may be an adnexal mass but upon further assessment on the ultrasound there were no concerning abnormalities identified by the radiologist. ? ?I interpreted the patient's lab results as well and her wet prep is notable for clue cells but her gonorrhea/chlamydia test is negative.  I reassessed the patient and she is feeling much better and has been sleeping.  I updated her about the results and she feels better and is comfortable with the plan for discharge and outpatient follow-up.  I will treat her with Flagyl for the bacterial vaginosis but will hold off on PID treatment since the gonorrhea and Chlamydia test are negative and since her ultrasound and CT scan were generally reassuring. ? ?Patient says she has OB/GYN follow-up but I will also give her the name and number that she can call if she would like to do so.  I gave my usual and customary return precautions. [CF]  ?  ?Clinical Course User Index ?[CF] Loleta Rose, MD  ? ?Final diagnoses:  ?Pelvic pain  ?Bacterial vaginosis  ? ? ?  ?Loleta Rose, MD ?04/15/22 2122 ? ?

## 2022-04-14 NOTE — ED Triage Notes (Signed)
First RN Note: pt to ED via POV with c/o lower abd pain. Pt states pain started last night. Pt states "took a pain pill" PTA. Pt states pain has progressively worsened since last night. Pt states pain to the point she is unable to sit down. Pt tearful on arrival to ED. Pt point to suprapubic area when pointing to where her pain is.  ?

## 2022-04-14 NOTE — ED Triage Notes (Signed)
Pt to ED from home c/o lower mid pelvic pain that started last night and getting worse, denies n/v/d or urinary changes.  States last menstrual cycle last month sometime and unsure if pregnant.  States hx of miscarriage and ectopic pregnancy.  Pt tearful in triage and in a lot of pain. ?

## 2022-04-14 NOTE — ED Provider Notes (Signed)
? ?[facility] ?Provider Note ? ? ? Event Date/Time  ? First MD Initiated Contact with Patient 04/14/22 2014   ?  (approximate) ? ? ?History  ? ?Abdominal Pain ? ? ?HPI ? ?Kathryn Nash is a 26 y.o. female who comes in with abdominal pain.  She does report taking a pain pill prior to arrival.  Patient reports severe pain in her lower pelvic region, suprapubic in nature.  Denies any dysuria, vaginal discharge.  Reports being sexually active and having a period of months ago but does have a history of ectopic pregnancy.  Never had to have any surgeries on her abdomen.  Denies any diarrhea, fevers, vomiting, labial abscess or other concerns.  Reports the pain is started out of nowhere earlier in the morning. ?  ? ? ?Physical Exam  ? ?Triage Vital Signs: ?ED Triage Vitals  ?Enc Vitals Group  ?   BP 04/14/22 1952 134/80  ?   Pulse Rate 04/14/22 1952 99  ?   Resp 04/14/22 1952 (!) 22  ?   Temp 04/14/22 1952 97.8 ?F (36.6 ?C)  ?   Temp Source 04/14/22 1952 Oral  ?   SpO2 04/14/22 1952 95 %  ?   Weight 04/14/22 1957 223 lb (101.2 kg)  ?   Height 04/14/22 1957 5\' 6"  (1.676 m)  ?   Head Circumference --   ?   Peak Flow --   ?   Pain Score 04/14/22 1957 10  ?   Pain Loc --   ?   Pain Edu? --   ?   Excl. in [place]? --   ? ? ?Most recent vital signs: ?Vitals:  ? 04/14/22 1952  ?BP: 134/80  ?Pulse: 99  ?Resp: (!) 22  ?Temp: 97.8 ?F (36.6 ?C)  ?SpO2: 95%  ? ? ? ?General: Awake, patient appears uncomfortable crying out in pain ?CV:  Good peripheral perfusion.  ?Resp:  Normal effort.  ?Abd:  No distention.  ?Other:  Patient is suprapubic tenderness ? ? ?ED Results / Procedures / Treatments  ? ?Labs ?(all labs ordered are listed, but only abnormal results are displayed) ?Labs Reviewed  ?LIPASE, BLOOD  ?COMPREHENSIVE METABOLIC PANEL  ?CBC  ?URINALYSIS, ROUTINE W REFLEX MICROSCOPIC  ?POC URINE PREG, ED  ? ? ? ?RADIOLOGY ?I have reviewed the [place] and no good images. Not able to see the cervix   ? ? ?PROCEDURES: ? ?Critical Care performed: No ? ?Procedures ? ? ?MEDICATIONS ORDERED IN ED: ?Medications - No data to display ? ? ?IMPRESSION / MDM / ASSESSMENT AND PLAN / ED COURSE  ?I reviewed the triage vital signs and the nursing notes. ? ?Patient comes in with severe pelvic pain.  Ultrasound evaluate for torsion, cyst rupture.  Labs to evaluate for UTI.  Will do pelvic to evaluate for STDs although she denies any discharge.  Pregnancy test is negative therefore no evidence of ectopic pregnancy.  Lipase normal.  CMP normal.  CBC elevated at 17 concerning for infection.  Urine without evidence of GI.  Patient was given 0.5 of Dilaudid to help facilitate ultrasound but was still having significant pain therefore given another milligram of Dilaudid. ? ?Ultrasound was difficult to get good images therefore we will proceed with CT imaging given patient's elevated white count.  They can also try to reultrasound her with some additional pain medication.  Patient be handed off to oncoming team pending these results.  Pelvic exam she is little bit of discharge and  cervical motion tenderness but she reports STD testing 2 weeks ago that was negative.  If everything else is negative we will treat for PID. ? ? ?FINAL CLINICAL IMPRESSION(S) / ED DIAGNOSES  ? ?Final diagnoses:  ?Pelvic pain  ? ? ? ?Rx / DC Orders  ? ?ED Discharge Orders   ? ? None  ? ?  ? ? ? ?Note:  This document was prepared using Dragon voice recognition software and may include unintentional dictation errors. ?  ?Vanessa [place], MD ?04/14/22 2330 ? ?

## 2022-04-15 DIAGNOSIS — R102 Pelvic and perineal pain: Secondary | ICD-10-CM | POA: Diagnosis not present

## 2022-04-15 LAB — CHLAMYDIA/NGC RT PCR (ARMC ONLY)
Chlamydia Tr: NOT DETECTED
N gonorrhoeae: NOT DETECTED

## 2022-04-15 MED ORDER — METRONIDAZOLE 500 MG PO TABS: 500.0000 mg | ORAL_TABLET | Freq: Two times a day (BID) | ORAL | 0 refills | Status: DC

## 2022-04-15 NOTE — Discharge Instructions (Signed)
Your workup in the Emergency Department today was reassuring.  We did not find any specific abnormalities.  We recommend you drink plenty of fluids, take your regular medications and/or any new ones prescribed today, and follow up with the doctor(s) listed in these documents as recommended.  Return to the Emergency Department if you develop new or worsening symptoms that concern you.  

## 2022-05-01 ENCOUNTER — Ambulatory Visit: Payer: Medicaid Other

## 2022-05-03 DIAGNOSIS — Z419 Encounter for procedure for purposes other than remedying health state, unspecified: Secondary | ICD-10-CM | POA: Diagnosis not present

## 2022-05-04 ENCOUNTER — Ambulatory Visit
Admission: RE | Admit: 2022-05-04 | Discharge: 2022-05-04 | Disposition: A | Discharge status: Home / Self Care | Payer: Medicaid Other | Source: Ambulatory Visit | Attending: Emergency Medicine | Admitting: Emergency Medicine

## 2022-05-04 VITALS — BP 110/66 | HR 68 | Temp 98.3°F | Resp 16

## 2022-05-04 DIAGNOSIS — Z3202 Encounter for pregnancy test, result negative: Secondary | ICD-10-CM | POA: Diagnosis not present

## 2022-05-04 DIAGNOSIS — Z0189 Encounter for other specified special examinations: Secondary | ICD-10-CM | POA: Insufficient documentation

## 2022-05-04 DIAGNOSIS — Z113 Encounter for screening for infections with a predominantly sexual mode of transmission: Secondary | ICD-10-CM | POA: Diagnosis not present

## 2022-05-04 LAB — POCT URINALYSIS DIP (MANUAL ENTRY)
Bilirubin, UA: NEGATIVE
Blood, UA: NEGATIVE
Glucose, UA: NEGATIVE mg/dL
Ketones, POC UA: NEGATIVE mg/dL
Leukocytes, UA: NEGATIVE
Nitrite, UA: NEGATIVE
Protein Ur, POC: NEGATIVE mg/dL
Spec Grav, UA: 1.02 (ref 1.010–1.025)
Urobilinogen, UA: 0.2 E.U./dL
pH, UA: 6.5 (ref 5.0–8.0)

## 2022-05-04 LAB — POCT URINE PREGNANCY: Preg Test, Ur: NEGATIVE

## 2022-05-04 NOTE — ED Triage Notes (Signed)
Patient presents to Urgent Care for UTI and STD testing. Pt states she is asymptomatic. Just wants to rule out.

## 2022-05-04 NOTE — ED Provider Notes (Signed)
[facility]    CSN: [id number] Arrival date & time: 05/04/22  1250      History   Chief Complaint Chief Complaint  Patient presents with   SEXUALLY TRANSMITTED DISEASE    Need to be checked for uti and Std body feels weird - Entered by patient    HPI Kathryn Nash is a 26 y.o. female.  Patient presents with request for urine testing and STD testing.  She reports she is not having any symptoms but wants routine testing.  She denies vaginal discharge, pelvic pain, abdominal pain, dysuria, hematuria, fever, rash, or other symptoms.    The history is provided by the patient and medical records.   Past Medical History:  Diagnosis Date   Asthma     Patient Active Problem List   Diagnosis Date Noted   Morbid obesity ([facility]) 220 lbs BMI=37.2 10/20/2019   Asthma exacerbation 12/08/2018    Past Surgical History:  Procedure Laterality Date   NO PAST SURGERIES      OB History     Gravida  2   Para      Term      Preterm      AB  1   Living  0      SAB  1   IAB      Ectopic      Multiple      Live Births               Home Medications    Prior to Admission medications   Medication Sig Start Date End Date Taking? Authorizing Provider  albuterol (VENTOLIN HFA) 108 (90 Base) MCG/ACT inhaler Inhale 2-4 puffs by mouth every 4 hours as needed for wheezing, cough, and/or shortness of breath Patient not taking: Reported on 03/07/2022 03/01/22   Loleta RoseForbach, Cory, MD  fluconazole (DIFLUCAN) 150 MG tablet Take 1 tablet (150 mg total) by mouth every 3 (three) days. Repeat if needed 03/18/22   Bing NeighborsHarris, Kimberly S, FNP  fluticasone-salmeterol (ADVAIR) 100-50 MCG/ACT AEPB Inhale 1 puff into the lungs 2 (two) times daily. Patient not taking: Reported on 03/07/2022 12/25/21   Lorre MunroeBaity, Regina W, NP  metroNIDAZOLE (FLAGYL) 500 MG tablet Take 1 tablet (500 mg total) by mouth 2 (two) times daily. 04/15/22   Loleta RoseForbach, Cory, MD  Norgestimate-Ethinyl Estradiol Triphasic  (TRI-LO-SPRINTEC) 0.18/0.215/0.25 MG-25 MCG tab Take 1 tablet by mouth daily. 11/15/21   Sciora, Austin MilesElizabeth A, CNM  predniSONE (DELTASONE) 10 MG tablet Take 6 tabs on day 1, 5 tabs on day 2, 4 tabs on day 3, 3 tabs on day 4, 2 tabs on day 5, 1 tab on day 6 Patient not taking: Reported on 03/07/2022 12/24/21   Lorre MunroeBaity, Regina W, NP    Family History Family History  Problem Relation Age of Onset   Asthma Mother    Asthma Father     Social History Social History   Tobacco Use   Smoking status: Never   Smokeless tobacco: Never   Tobacco comments:    Never used e-cigarettes  Vaping Use   Vaping Use: Never used  Substance Use Topics   Alcohol use: Not Currently    Alcohol/week: 1.0 standard drink    Types: 1 Shots of liquor per week   Drug use: Never     Allergies   Eggs or egg-derived products, Iohexol, Montelukast sodium, and Penicillins   Review of Systems Review of Systems  Constitutional:  Negative for chills and fever.  Gastrointestinal:  Negative for abdominal pain, nausea and vomiting.  Genitourinary:  Negative for dysuria, flank pain, hematuria, pelvic pain and vaginal discharge.  Skin:  Negative for color change and rash.  All other systems reviewed and are negative.   Physical Exam Triage Vital Signs ED Triage Vitals  Enc Vitals Group     BP      Pulse      Resp      Temp      Temp src      SpO2      Weight      Height      Head Circumference      Peak Flow      Pain Score      Pain Loc      Pain Edu?      Excl. in [place]?    No data found.  Updated Vital Signs BP 110/66 (BP Location: Left Arm)   Pulse 68   Temp 98.3 F (36.8 C) (Oral)   Resp 16   LMP 04/20/2022   SpO2 96%   Visual Acuity Right Eye Distance:   Left Eye Distance:   Bilateral Distance:    Right Eye Near:   Left Eye Near:    Bilateral Near:     Physical Exam Vitals and nursing note reviewed.  Constitutional:      General: She is not in acute distress.    Appearance: Normal  appearance. She is well-developed. She is not ill-appearing.  HENT:     Mouth/Throat:     Mouth: Mucous membranes are moist.  Cardiovascular:     Rate and Rhythm: Normal rate and regular rhythm.     Heart sounds: Normal heart sounds.  Pulmonary:     Effort: Pulmonary effort is normal. No respiratory distress.     Breath sounds: Normal breath sounds.  Abdominal:     General: Bowel sounds are normal.     Palpations: Abdomen is soft.     Tenderness: There is no abdominal tenderness. There is no right CVA tenderness, left CVA tenderness, guarding or rebound.  Musculoskeletal:     Cervical back: Neck supple.  Skin:    General: Skin is warm and dry.  Neurological:     Mental Status: She is alert.  Psychiatric:        Mood and Affect: Mood normal.        Behavior: Behavior normal.     UC Treatments / Results  Labs (all labs ordered are listed, but only abnormal results are displayed) Labs Reviewed  POCT URINALYSIS DIP (MANUAL ENTRY)  POCT URINE PREGNANCY  CERVICOVAGINAL ANCILLARY ONLY    EKG   Radiology No results found.  Procedures Procedures (including critical care time)  Medications Ordered in UC Medications - No data to display  Initial Impression / Assessment and Plan / UC Course  I have reviewed the triage vital signs and the nursing notes.  Pertinent labs & imaging results that were available during my care of the patient were reviewed by me and considered in my medical decision making (see chart for details).   STD screening, encounter for urine test, negative pregnancy test.  Urine normal.  Urine pregnancy negative.  Patient obtained vaginal self swab for testing.  Discussed that we will call if test results are positive.  Instructed patient to abstain from sexual activity for at least 7 days.  Instructed her to follow-up with her PCP or gynecologist as needed.  Patient agrees to plan of care.  Final Clinical Impressions(s) / UC Diagnoses   Final  diagnoses:  Screening for STD (sexually transmitted disease)  Encounter for urine test  Negative pregnancy test     Discharge Instructions      Your urine is normal.  Pregnancy test is negative.    Your vaginal tests are pending.  If your test results are positive, we will call you.  Do not have sexual activity for at least 7 days.         ED Prescriptions   None    PDMP not reviewed this encounter.   Mickie Bail, NP 05/04/22 [phone]

## 2022-05-04 NOTE — Discharge Instructions (Addendum)
Your urine is normal.  Pregnancy test is negative.    Your vaginal tests are pending.  If your test results are positive, we will call you.  Do not have sexual activity for at least 7 days.

## 2022-05-07 LAB — CERVICOVAGINAL ANCILLARY ONLY
Bacterial Vaginitis (gardnerella): NEGATIVE
Candida Glabrata: NEGATIVE
Candida Vaginitis: POSITIVE — AB
Chlamydia: POSITIVE — AB
Comment: NEGATIVE
Comment: NEGATIVE
Comment: NEGATIVE
Comment: NEGATIVE
Comment: NEGATIVE
Comment: NORMAL
Neisseria Gonorrhea: NEGATIVE
Trichomonas: NEGATIVE

## 2022-05-08 ENCOUNTER — Ambulatory Visit: Payer: Self-pay

## 2022-05-08 ENCOUNTER — Telehealth: Payer: Self-pay | Admitting: Family Medicine

## 2022-05-08 DIAGNOSIS — A749 Chlamydial infection, unspecified: Secondary | ICD-10-CM

## 2022-05-08 DIAGNOSIS — N760 Acute vaginitis: Secondary | ICD-10-CM

## 2022-05-08 MED ORDER — DOXYCYCLINE HYCLATE 100 MG PO CAPS: 100.0000 mg | ORAL_CAPSULE | Freq: Two times a day (BID) | ORAL | 0 refills | Status: DC

## 2022-05-08 MED ORDER — FLUCONAZOLE 150 MG PO TABS: 150.0000 mg | ORAL_TABLET | Freq: Once | ORAL | 0 refills | Status: AC

## 2022-05-08 NOTE — Telephone Encounter (Signed)
Patient called related to positive STD results for yeast and chlamydia. Sending Doxycyline and Diflucan to [facility] in [place]

## 2022-05-11 ENCOUNTER — Ambulatory Visit: Payer: Medicaid Other | Admitting: Nurse Practitioner

## 2022-05-14 ENCOUNTER — Ambulatory Visit (INDEPENDENT_AMBULATORY_CARE_PROVIDER_SITE_OTHER): Payer: Medicaid Other | Admitting: Internal Medicine

## 2022-05-14 ENCOUNTER — Encounter: Payer: Self-pay | Admitting: Internal Medicine

## 2022-05-14 DIAGNOSIS — G4733 Obstructive sleep apnea (adult) (pediatric): Secondary | ICD-10-CM

## 2022-05-14 DIAGNOSIS — J45901 Unspecified asthma with (acute) exacerbation: Secondary | ICD-10-CM

## 2022-05-14 MED ORDER — FLUTICASONE FUROATE-VILANTEROL 100-25 MCG/ACT IN AEPB: 1.0000 | INHALATION_SPRAY | Freq: Every day | RESPIRATORY_TRACT | 6 refills | Status: DC

## 2022-05-14 MED ORDER — ALBUTEROL SULFATE HFA 108 (90 BASE) MCG/ACT IN AERS: 2.0000 | INHALATION_SPRAY | Freq: Two times a day (BID) | RESPIRATORY_TRACT | 6 refills | Status: DC

## 2022-05-14 NOTE — Assessment & Plan Note (Signed)
Patient has a problem with wheezing has been told to have asthma in the past.  We will start her on Breo and rescue inhaler, she was advised to lose weight she will also have a sleep study done.

## 2022-05-14 NOTE — Progress Notes (Signed)
New Patient Office Visit  Subjective:  Patient ID: Kathryn Nash, female    DOB: 1996-06-04  Age: 26 y.o. MRN: [id number]  CC:  Chief Complaint  Patient presents with   New Patient (Initial Visit)    Wheezing  This is a recurrent problem. The current episode started 1 to 4 weeks ago. Associated symptoms include rhinorrhea and shortness of breath. Pertinent negatives include no abdominal pain, chills, ear pain, fever, headaches, hemoptysis, neck pain, rash, sore throat, sputum production, swollen glands or vomiting. There is no history of COPD, DVT, heart failure or PE.   Patient presents for wheezing  Past Medical History:  Diagnosis Date   Asthma     No current outpatient medications on file.   Past Surgical History:  Procedure Laterality Date   NO PAST SURGERIES      Family History  Problem Relation Age of Onset   Asthma Mother    Asthma Father    Asthma Maternal Grandmother     Social History   Socioeconomic History   Marital status: Single    Spouse name: Not on file   Number of children: 0   Years of education: Not on file   Highest education level: Not on file  Occupational History   Not on file  Tobacco Use   Smoking status: Never   Smokeless tobacco: Never   Tobacco comments:    Never used e-cigarettes  Vaping Use   Vaping Use: Never used  Substance and Sexual Activity   Alcohol use: Not Currently    Alcohol/week: 1.0 standard drink of alcohol    Types: 1 Shots of liquor per week   Drug use: Never   Sexual activity: Yes    Partners: Male    Birth control/protection: None  Other Topics Concern   Not on file  Social History Narrative   Not on file   Social Determinants of Health   Financial Resource Strain: Not on file  Food Insecurity: No Food Insecurity (10/17/2020)   Hunger Vital Sign    Worried About Running Out of Food in the Last Year: Never true    Ran Out of Food in the Last Year: Never true  Transportation Needs: No  Transportation Needs (10/17/2020)   PRAPARE - Administrator, Civil ServiceTransportation    Lack of Transportation (Medical): No    Lack of Transportation (Non-Medical): No  Physical Activity: Not on file  Stress: Not on file  Social Connections: Not on file  Intimate Partner Violence: Not At Risk (01/08/2022)   Humiliation, Afraid, Rape, and Kick questionnaire    Fear of Current or Ex-Partner: No    Emotionally Abused: No    Physically Abused: No    Sexually Abused: No    ROS Review of Systems  Constitutional:  Negative for chills and fever.  HENT:  Positive for rhinorrhea. Negative for ear pain and sore throat.   Respiratory:  Positive for shortness of breath and wheezing. Negative for hemoptysis and sputum production.   Gastrointestinal:  Negative for abdominal pain and vomiting.  Musculoskeletal:  Negative for neck pain.  Skin:  Negative for rash.  Neurological:  Negative for headaches.    Objective:   Today's Vitals: BP 122/76   Pulse 79   Ht 5\' 6"  (1.676 m)   Wt 225 lb 6.4 oz (102.2 kg)   LMP 04/20/2022   BMI 36.38 kg/m   Physical Exam Vitals reviewed.  Constitutional:      Appearance: She is obese.  HENT:  Head: Normocephalic.     Nose: Nose normal.     Mouth/Throat:     Mouth: Mucous membranes are moist.     Pharynx: No oropharyngeal exudate.  Cardiovascular:     Rate and Rhythm: Normal rate and regular rhythm.  Pulmonary:     Breath sounds: Wheezing present.  Abdominal:     General: There is no distension.     Tenderness: There is no abdominal tenderness.  Musculoskeletal:        General: Normal range of motion.     Cervical back: Normal range of motion.  Skin:    General: Skin is warm.  Neurological:     General: No focal deficit present.     Mental Status: She is alert.     Assessment & Plan:   Problem List Items Addressed This Visit       Respiratory   Asthma exacerbation    Patient has a problem with wheezing has been told to have asthma in the past.  We will  start her on Breo and rescue inhaler, she was advised to lose weight she will also have a sleep study done.      OSA (obstructive sleep apnea)    Patient snores at night, has a small oral cavity he feels tired and fatigued.  We will schedule her for a sleep study to further evaluate for sleep apnea.  She does get sleepy while driving the car patient has been advised to lose weight.        Other   Morbid obesity (HCC) 220 lbs BMI=37.2 - Primary    - I encouraged the patient to lose weight.  - I educated them on making healthy dietary choices including eating more fruits and vegetables and less fried foods. - I encouraged the patient to exercise more, and educated on the benefits of exercise including weight loss, diabetes prevention, and hypertension prevention.   Dietary counseling with a registered dietician  Referral to a weight management support group (e.g. Weight Watchers, Overeaters Anonymous)  If your BMI is greater than 29 or you have gained more than 15 pounds you should work on weight loss.  Attend a healthy cooking class      Hypercholesterolemia  I advised the patient to follow Mediterranean diet This diet is rich in fruits vegetables and whole grain, and This diet is also rich in fish and lean meat Patient should also eat a handful of almonds or walnuts daily Recent heart study indicated that average follow-up on this kind of diet reduces the cardiovascular mortality by 50 to 70%  Outpatient Encounter Medications as of 05/14/2022  Medication Sig   [DISCONTINUED] albuterol (VENTOLIN HFA) 108 (90 Base) MCG/ACT inhaler Inhale 2-4 puffs by mouth every 4 hours as needed for wheezing, cough, and/or shortness of breath (Patient not taking: Reported on 03/07/2022)   [DISCONTINUED] doxycycline (VIBRAMYCIN) 100 MG capsule Take 1 capsule (100 mg total) by mouth 2 (two) times daily for 7 days. (Patient not taking: Reported on 05/14/2022)   [DISCONTINUED] fluticasone-salmeterol (ADVAIR)  100-50 MCG/ACT AEPB Inhale 1 puff into the lungs 2 (two) times daily. (Patient not taking: Reported on 03/07/2022)   [DISCONTINUED] metroNIDAZOLE (FLAGYL) 500 MG tablet Take 1 tablet (500 mg total) by mouth 2 (two) times daily. (Patient not taking: Reported on 05/14/2022)   [DISCONTINUED] Norgestimate-Ethinyl Estradiol Triphasic (TRI-LO-SPRINTEC) 0.18/0.215/0.25 MG-25 MCG tab Take 1 tablet by mouth daily. (Patient not taking: Reported on 05/14/2022)   [DISCONTINUED] predniSONE (DELTASONE) 10 MG tablet Take 6  tabs on day 1, 5 tabs on day 2, 4 tabs on day 3, 3 tabs on day 4, 2 tabs on day 5, 1 tab on day 6 (Patient not taking: Reported on 03/07/2022)   No facility-administered encounter medications on file as of 05/14/2022.    Follow-up: No follow-ups on file.   Corky Downs, MD

## 2022-05-14 NOTE — Assessment & Plan Note (Signed)

## 2022-05-14 NOTE — Assessment & Plan Note (Signed)
Patient snores at night, has a small oral cavity he feels tired and fatigued.  We will schedule her for a sleep study to further evaluate for sleep apnea.  She does get sleepy while driving the car patient has been advised to lose weight.

## 2022-05-29 ENCOUNTER — Ambulatory Visit
Admission: RE | Admit: 2022-05-29 | Discharge: 2022-05-29 | Disposition: A | Discharge status: Home / Self Care | Payer: Medicaid Other | Source: Ambulatory Visit | Attending: Emergency Medicine | Admitting: Emergency Medicine

## 2022-05-29 VITALS — BP 115/87 | HR 70 | Temp 98.0°F | Resp 16

## 2022-05-29 DIAGNOSIS — R8290 Unspecified abnormal findings in urine: Secondary | ICD-10-CM | POA: Diagnosis not present

## 2022-05-29 DIAGNOSIS — Z3202 Encounter for pregnancy test, result negative: Secondary | ICD-10-CM | POA: Diagnosis not present

## 2022-05-29 DIAGNOSIS — M545 Low back pain, unspecified: Secondary | ICD-10-CM

## 2022-05-29 DIAGNOSIS — Z113 Encounter for screening for infections with a predominantly sexual mode of transmission: Secondary | ICD-10-CM

## 2022-05-29 HISTORY — DX: Chlamydial infection, unspecified: A74.9

## 2022-05-29 LAB — POCT URINE PREGNANCY: Preg Test, Ur: NEGATIVE

## 2022-05-29 LAB — POCT URINALYSIS DIP (MANUAL ENTRY)
Bilirubin, UA: NEGATIVE
Blood, UA: NEGATIVE
Glucose, UA: NEGATIVE mg/dL
Ketones, POC UA: NEGATIVE mg/dL
Leukocytes, UA: NEGATIVE
Nitrite, UA: NEGATIVE
Protein Ur, POC: NEGATIVE mg/dL
Spec Grav, UA: 1.02 (ref 1.010–1.025)
Urobilinogen, UA: 0.2 E.U./dL
pH, UA: 7 (ref 5.0–8.0)

## 2022-05-29 NOTE — ED Triage Notes (Addendum)
Patient presents to Urgent Care with complaints of lower back pain and STD testing. Pt states she was seen 06/02 for STD screening. Treating pain with ibuprofen.

## 2022-05-30 LAB — CERVICOVAGINAL ANCILLARY ONLY
Bacterial Vaginitis (gardnerella): NEGATIVE
Candida Glabrata: NEGATIVE
Candida Vaginitis: NEGATIVE
Chlamydia: NEGATIVE
Comment: NEGATIVE
Comment: NEGATIVE
Comment: NEGATIVE
Comment: NEGATIVE
Comment: NEGATIVE
Comment: NORMAL
Neisseria Gonorrhea: NEGATIVE
Trichomonas: NEGATIVE

## 2022-06-02 DIAGNOSIS — Z419 Encounter for procedure for purposes other than remedying health state, unspecified: Secondary | ICD-10-CM | POA: Diagnosis not present

## 2022-06-18 ENCOUNTER — Ambulatory Visit (INDEPENDENT_AMBULATORY_CARE_PROVIDER_SITE_OTHER): Payer: Medicaid Other | Admitting: Internal Medicine

## 2022-06-18 ENCOUNTER — Encounter: Payer: Self-pay | Admitting: Internal Medicine

## 2022-06-18 VITALS — BP 122/81 | HR 68 | Ht 66.0 in | Wt 224.8 lb

## 2022-06-18 DIAGNOSIS — D72823 Leukemoid reaction: Secondary | ICD-10-CM

## 2022-06-18 DIAGNOSIS — M5441 Lumbago with sciatica, right side: Secondary | ICD-10-CM | POA: Diagnosis not present

## 2022-06-18 DIAGNOSIS — D72820 Lymphocytosis (symptomatic): Secondary | ICD-10-CM | POA: Insufficient documentation

## 2022-06-18 DIAGNOSIS — G4733 Obstructive sleep apnea (adult) (pediatric): Secondary | ICD-10-CM | POA: Diagnosis not present

## 2022-06-18 DIAGNOSIS — J45901 Unspecified asthma with (acute) exacerbation: Secondary | ICD-10-CM

## 2022-06-18 DIAGNOSIS — Z113 Encounter for screening for infections with a predominantly sexual mode of transmission: Secondary | ICD-10-CM | POA: Diagnosis not present

## 2022-06-18 MED ORDER — BUDESONIDE-FORMOTEROL FUMARATE 80-4.5 MCG/ACT IN AERO: 2.0000 | INHALATION_SPRAY | Freq: Two times a day (BID) | RESPIRATORY_TRACT | 6 refills | Status: DC

## 2022-06-18 NOTE — Progress Notes (Signed)
Established Patient Office Visit  Subjective:  Patient ID: Kathryn Nash, female    DOB: 1996/09/25  Age: 26 y.o. MRN: [id number]  CC:  Chief Complaint  Patient presents with   Follow-up    Patient here today for screening for std. Explained to patient that we only can do urine hear and that we could refer to gyn. Order placed today.    Asthma    Patient states that the albuterol inhaler has not been helping. Patient has used Symbicort and it worked well. Patient would like rx for Symbicort.     Asthma She complains of cough and wheezing. This is a recurrent problem. The problem has been gradually worsening. Associated symptoms include nasal congestion. Pertinent negatives include no dyspnea on exertion, ear congestion, heartburn, malaise/fatigue, myalgias, orthopnea, PND or sore throat. Her past medical history is significant for asthma.  Back Pain The current episode started in the past 7 days. The problem is unchanged. The pain is present in the lumbar spine. The quality of the pain is described as cramping. The pain is at a severity of 4/10. The pain is moderate. The symptoms are aggravated by twisting. Pertinent negatives include no abdominal pain or numbness.    Kathryn Nash presents for check up  Past Medical History:  Diagnosis Date   Asthma    Chlamydia     Past Surgical History:  Procedure Laterality Date   NO PAST SURGERIES      Family History  Problem Relation Age of Onset   Asthma Mother    Asthma Father    Asthma Maternal Grandmother     Social History   Socioeconomic History   Marital status: Single    Spouse name: Not on file   Number of children: 0   Years of education: Not on file   Highest education level: Not on file  Occupational History   Not on file  Tobacco Use   Smoking status: Never   Smokeless tobacco: Never   Tobacco comments:    Never used e-cigarettes  Vaping Use   Vaping Use: Never used  Substance and Sexual Activity    Alcohol use: Not Currently    Alcohol/week: 1.0 standard drink of alcohol    Types: 1 Shots of liquor per week   Drug use: Never   Sexual activity: Yes    Partners: Male    Birth control/protection: None  Other Topics Concern   Not on file  Social History Narrative   Not on file   Social Determinants of Health   Financial Resource Strain: Not on file  Food Insecurity: No Food Insecurity (10/17/2020)   Hunger Vital Sign    Worried About Running Out of Food in the Last Year: Never true    Ran Out of Food in the Last Year: Never true  Transportation Needs: No Transportation Needs (10/17/2020)   PRAPARE - Administrator, Civil Service (Medical): No    Lack of Transportation (Non-Medical): No  Physical Activity: Not on file  Stress: Not on file  Social Connections: Not on file  Intimate Partner Violence: Not At Risk (01/08/2022)   Humiliation, Afraid, Rape, and Kick questionnaire    Fear of Current or Ex-Partner: No    Emotionally Abused: No    Physically Abused: No    Sexually Abused: No     Current Outpatient Medications:    budesonide-formoterol (SYMBICORT) 80-4.5 MCG/ACT inhaler, Inhale 2 puffs into the lungs in the morning and at  bedtime., Disp: 10.2 g, Rfl: 6   albuterol (VENTOLIN HFA) 108 (90 Base) MCG/ACT inhaler, Inhale 2 puffs into the lungs in the morning and at bedtime., Disp: 8 g, Rfl: 6   fluticasone furoate-vilanterol (BREO ELLIPTA) 100-25 MCG/ACT AEPB, Inhale 1 puff into the lungs daily., Disp: 28 each, Rfl: 6   Allergies  Allergen Reactions   Eggs Or Egg-Derived Products Hives   Iohexol Hives   Montelukast Sodium Hives    Singulair   Penicillins Rash    Has patient had a PCN reaction causing immediate rash, facial/tongue/throat swelling, SOB or lightheadedness with hypotension: Unknown Has patient had a PCN reaction causing severe rash involving mucus membranes or skin necrosis: Unknown Has patient had a PCN reaction that required hospitalization:  Unknown Has patient had a PCN reaction occurring within the last 10 years: Unknown If all of the above answers are "NO", then may proceed with Cephalosporin use.     ROS Review of Systems  Constitutional:  Negative for malaise/fatigue.  HENT:  Negative for sore throat.   Respiratory:  Positive for cough and wheezing.   Cardiovascular:  Negative for dyspnea on exertion, leg swelling and PND.  Gastrointestinal:  Negative for abdominal pain and heartburn.  Endocrine: Negative for polyphagia.  Genitourinary:  Negative for difficulty urinating.  Musculoskeletal:  Positive for back pain. Negative for myalgias.  Allergic/Immunologic: Negative for food allergies.  Neurological:  Negative for numbness.  Hematological:  Negative for adenopathy.  Psychiatric/Behavioral:  Negative for agitation.       Objective:    Physical Exam Vitals reviewed.  Constitutional:      Appearance: Normal appearance.  HENT:     Mouth/Throat:     Mouth: Mucous membranes are moist.  Eyes:     Pupils: Pupils are equal, round, and reactive to light.  Neck:     Vascular: No carotid bruit.  Cardiovascular:     Rate and Rhythm: Normal rate and regular rhythm.     Pulses: Normal pulses.     Heart sounds: Normal heart sounds.  Pulmonary:     Effort: Pulmonary effort is normal.     Breath sounds: Normal breath sounds.  Abdominal:     General: Bowel sounds are normal.     Palpations: Abdomen is soft. There is no hepatomegaly, splenomegaly or mass.     Tenderness: There is no abdominal tenderness.     Hernia: No hernia is present.  Musculoskeletal:        General: No tenderness.     Cervical back: Neck supple.     Right lower leg: No edema.     Left lower leg: No edema.  Skin:    Findings: No rash.  Neurological:     Mental Status: She is alert and oriented to person, place, and time.     Motor: No weakness.  Psychiatric:        Mood and Affect: Mood and affect normal.        Behavior: Behavior  normal.     BP 122/81   Pulse 68   Ht 5\' 6"  (1.676 m)   Wt 224 lb 12.8 oz (102 kg)   BMI 36.28 kg/m  Wt Readings from Last 3 Encounters:  06/18/22 224 lb 12.8 oz (102 kg)  05/14/22 225 lb 6.4 oz (102.2 kg)  04/14/22 223 lb (101.2 kg)     Health Maintenance Due  Topic Date Due   COVID-19 Vaccine (1) Never done   PAP-Cervical Cytology Screening  Never done  There are no preventive care reminders to display for this patient.  No results found for: "TSH" Lab Results  Component Value Date   WBC 17.2 (H) 04/14/2022   HGB 13.2 04/14/2022   HCT 40.0 04/14/2022   MCV 88.7 04/14/2022   PLT 408 (H) 04/14/2022   Lab Results  Component Value Date   NA 137 04/14/2022   K 3.9 04/14/2022   CO2 22 04/14/2022   GLUCOSE 101 (H) 04/14/2022   BUN 11 04/14/2022   CREATININE 0.72 04/14/2022   BILITOT 0.6 04/14/2022   ALKPHOS 71 04/14/2022   AST 19 04/14/2022   ALT 17 04/14/2022   PROT 8.4 (H) 04/14/2022   ALBUMIN 4.5 04/14/2022   CALCIUM 9.5 04/14/2022   ANIONGAP 13 04/14/2022   No results found for: "CHOL" No results found for: "HDL" No results found for: "LDLCALC" No results found for: "TRIG" No results found for: "CHOLHDL" No results found for: "HGBA1C"    Assessment & Plan:   Problem List Items Addressed This Visit       Respiratory   Asthma exacerbation    Patient likes to use Symbicort for the asthma      Relevant Medications   budesonide-formoterol (SYMBICORT) 80-4.5 MCG/ACT inhaler   OSA (obstructive sleep apnea)    Patient has a problem with snoring at night, it is witnessed by the boyfriend.  I will send him to sleep specialist Patient has a crowded upper airway with enlarged tonsils her neck size is also bigger  than normal I will refer him to ENT specialist to look at the throat to see whether tonsillectomy might help.  I cannot palpate any nodule or goiter        Nervous and Auditory   Acute right-sided low back pain with right-sided sciatica     I advised the patient to use Voltaren gel and use a back brace, if he does not better she can see an orthopedic surgeon.        Other   Morbid obesity (HCC) 220 lbs BMI=37.2    Patient was definitely advised to lose weight.      Relevant Orders   CBC with Differential/Platelet   COMPLETE METABOLIC PANEL WITH GFR   Lipid panel   TSH   Leukemoid reaction    Patient have evidence of leukocytosis we will repeat the CBC again if WBC count is still elevated we will send the patient to hematologist      Other Visit Diagnoses     Screening for STD (sexually transmitted disease)    -  Primary   Relevant Orders   Ambulatory referral to Obstetrics / Gynecology       Meds ordered this encounter  Medications   budesonide-formoterol (SYMBICORT) 80-4.5 MCG/ACT inhaler    Sig: Inhale 2 puffs into the lungs in the morning and at bedtime.    Dispense:  10.2 g    Refill:  6    Follow-up: No follow-ups on file.    Corky Downs, MD

## 2022-06-18 NOTE — Assessment & Plan Note (Signed)
Patient has a problem with snoring at night, it is witnessed by the boyfriend.  I will send him to sleep specialist Patient has a crowded upper airway with enlarged tonsils her neck size is also bigger  than normal I will refer him to ENT specialist to look at the throat to see whether tonsillectomy might help.  I cannot palpate any nodule or goiter

## 2022-06-18 NOTE — Assessment & Plan Note (Signed)
Patient have evidence of leukocytosis we will repeat the CBC again if WBC count is still elevated we will send the patient to hematologist

## 2022-06-18 NOTE — Assessment & Plan Note (Signed)
Patient was definitely advised to lose weight.

## 2022-06-18 NOTE — Assessment & Plan Note (Signed)
Patient likes to use Symbicort for the asthma

## 2022-06-18 NOTE — Assessment & Plan Note (Signed)
I advised the patient to use Voltaren gel and use a back brace, if he does not better she can see an orthopedic surgeon.

## 2022-06-19 LAB — COMPLETE METABOLIC PANEL WITH GFR
AG Ratio: 1.4 (calc) (ref 1.0–2.5)
ALT: 19 U/L (ref 6–29)
AST: 14 U/L (ref 10–30)
Albumin: 4.2 g/dL (ref 3.6–5.1)
Alkaline phosphatase (APISO): 65 U/L (ref 31–125)
BUN: 8 mg/dL (ref 7–25)
CO2: 23 mmol/L (ref 20–32)
Calcium: 9.2 mg/dL (ref 8.6–10.2)
Chloride: 106 mmol/L (ref 98–110)
Creat: 0.83 mg/dL (ref 0.50–0.96)
Globulin: 2.9 g/dL (calc) (ref 1.9–3.7)
Glucose, Bld: 99 mg/dL (ref 65–99)
Potassium: 4.4 mmol/L (ref 3.5–5.3)
Sodium: 140 mmol/L (ref 135–146)
Total Bilirubin: 0.3 mg/dL (ref 0.2–1.2)
Total Protein: 7.1 g/dL (ref 6.1–8.1)
eGFR: 100 mL/min/{1.73_m2} (ref >=60)

## 2022-06-19 LAB — LIPID PANEL
Cholesterol: 135 mg/dL (ref <200)
HDL: 42 mg/dL — ABNORMAL LOW (ref >=50)
LDL Cholesterol (Calc): 74 mg/dL (calc)
Non-HDL Cholesterol (Calc): 93 mg/dL (calc) (ref <130)
Total CHOL/HDL Ratio: 3.2 (calc) (ref <5.0)
Triglycerides: 109 mg/dL (ref <150)

## 2022-06-19 LAB — CBC WITH DIFFERENTIAL/PLATELET
Absolute Monocytes: 604 cells/uL (ref 200–950)
Basophils Absolute: 85 cells/uL (ref 0–200)
Basophils Relative: 0.8 %
Eosinophils Absolute: 890 cells/uL — ABNORMAL HIGH (ref 15–500)
Eosinophils Relative: 8.4 %
HCT: 41 % (ref 35.0–45.0)
Hemoglobin: 13.5 g/dL (ref 11.7–15.5)
Lymphs Abs: 2904 cells/uL (ref 850–3900)
MCH: 30 pg (ref 27.0–33.0)
MCHC: 32.9 g/dL (ref 32.0–36.0)
MCV: 91.1 fL (ref 80.0–100.0)
MPV: 10.2 fL (ref 7.5–12.5)
Monocytes Relative: 5.7 %
Neutro Abs: 6116 cells/uL (ref 1500–7800)
Neutrophils Relative %: 57.7 %
Platelets: 466 10*3/uL — ABNORMAL HIGH (ref 140–400)
RBC: 4.5 10*6/uL (ref 3.80–5.10)
RDW: 11.9 % (ref 11.0–15.0)
Total Lymphocyte: 27.4 %
WBC: 10.6 10*3/uL (ref 3.8–10.8)

## 2022-06-19 LAB — TSH: TSH: 1.26 mIU/L

## 2022-06-20 ENCOUNTER — Encounter: Payer: Self-pay | Admitting: Certified Nurse Midwife

## 2022-06-20 ENCOUNTER — Ambulatory Visit (INDEPENDENT_AMBULATORY_CARE_PROVIDER_SITE_OTHER): Payer: Medicaid Other | Admitting: Certified Nurse Midwife

## 2022-06-20 VITALS — BP 125/75 | HR 72 | Ht 66.0 in | Wt 226.5 lb

## 2022-06-20 DIAGNOSIS — Z113 Encounter for screening for infections with a predominantly sexual mode of transmission: Secondary | ICD-10-CM | POA: Diagnosis not present

## 2022-06-20 MED ORDER — NORETHINDRONE ACET-ETHINYL EST 1.5-30 MG-MCG PO TABS: 1.0000 | ORAL_TABLET | Freq: Every day | ORAL | 11 refills | Status: DC

## 2022-06-20 NOTE — Patient Instructions (Signed)

## 2022-06-20 NOTE — Progress Notes (Signed)
GYN ENCOUNTER NOTE  Subjective:       Kathryn Nash is a 26 y.o. G25P0010 female is here for gynecologic evaluation of the following issues:  1. STD screening pt state she has a new partner and would like testing. [facility] was told that they do not do swabs.  2. Birth control - was started on Tri Lo Marzia due to not having a period. Has had one since she started . She would like to be on a OCP with periods.    Gynecologic History No LMP recorded (lmp unknown). Contraception: OCP (estrogen/progesterone) Last Pap: 07/06/2021. Results were: normal Last mammogram: n/a. Results were: normal  Obstetric History OB History  Gravida Para Term Preterm AB Living  2       1 0  SAB IAB Ectopic Multiple Live Births  1            # Outcome Date GA Lbr Len/2nd Weight Sex Delivery Anes PTL Lv  2 Gravida           1 SAB             Past Medical History:  Diagnosis Date   Asthma    Chlamydia     Past Surgical History:  Procedure Laterality Date   NO PAST SURGERIES      Current Outpatient Medications on File Prior to Visit  Medication Sig Dispense Refill   albuterol (VENTOLIN HFA) 108 (90 Base) MCG/ACT inhaler Inhale 2 puffs into the lungs in the morning and at bedtime. 8 g 6   budesonide-formoterol (SYMBICORT) 80-4.5 MCG/ACT inhaler Inhale 2 puffs into the lungs in the morning and at bedtime. 10.2 g 6   fluticasone furoate-vilanterol (BREO ELLIPTA) 100-25 MCG/ACT AEPB Inhale 1 puff into the lungs daily. 28 each 6   No current facility-administered medications on file prior to visit.    Allergies  Allergen Reactions   Eggs Or Egg-Derived Products Hives   Iohexol Hives   Montelukast Sodium Hives    Singulair   Penicillins Rash    Has patient had a PCN reaction causing immediate rash, facial/tongue/throat swelling, SOB or lightheadedness with hypotension: Unknown Has patient had a PCN reaction causing severe rash involving mucus membranes or skin  necrosis: Unknown Has patient had a PCN reaction that required hospitalization: Unknown Has patient had a PCN reaction occurring within the last 10 years: Unknown If all of the above answers are "NO", then may proceed with Cephalosporin use.     Social History   Socioeconomic History   Marital status: Single    Spouse name: Not on file   Number of children: 0   Years of education: Not on file   Highest education level: Not on file  Occupational History   Not on file  Tobacco Use   Smoking status: Never   Smokeless tobacco: Never   Tobacco comments:    Never used e-cigarettes  Vaping Use   Vaping Use: Never used  Substance and Sexual Activity   Alcohol use: Not Currently    Alcohol/week: 1.0 standard drink of alcohol    Types: 1 Shots of liquor per week   Drug use: Never   Sexual activity: Yes    Partners: Male    Birth control/protection: None  Other Topics Concern   Not on file  Social History Narrative   Not on file   Social Determinants of Health   Financial Resource Strain: Not on file  Food Insecurity:  No Food Insecurity (10/17/2020)   Hunger Vital Sign    Worried About Running Out of Food in the Last Year: Never true    Ran Out of Food in the Last Year: Never true  Transportation Needs: No Transportation Needs (10/17/2020)   PRAPARE - Administrator, Civil Service (Medical): No    Lack of Transportation (Non-Medical): No  Physical Activity: Not on file  Stress: Not on file  Social Connections: Not on file  Intimate Partner Violence: Not At Risk (01/08/2022)   Humiliation, Afraid, Rape, and Kick questionnaire    Fear of Current or Ex-Partner: No    Emotionally Abused: No    Physically Abused: No    Sexually Abused: No    Family History  Problem Relation Age of Onset   Asthma Mother    Asthma Father    Asthma Maternal Grandmother     The following portions of the patient's history were reviewed and updated as appropriate: allergies,  current medications, past family history, past medical history, past social history, past surgical history and problem list.  Review of Systems Review of Systems - Negative except as mentioned in HPI Review of Systems - General ROS: negative for - chills, fatigue, fever, hot flashes, malaise or night sweats Hematological and Lymphatic ROS: negative for - bleeding problems or swollen lymph nodes Gastrointestinal ROS: negative for - abdominal pain, blood in stools, change in bowel habits and nausea/vomiting Musculoskeletal ROS: negative for - joint pain, muscle pain or muscular weakness Genito-Urinary ROS: negative for - change in menstrual cycle, dysmenorrhea, dyspareunia, dysuria, genital discharge, genital ulcers, hematuria, incontinence, heavy menses, nocturia or pelvic pain. Positive for irregular periods.  Objective:   BP 125/75   Pulse 72   Ht 5\' 6"  (1.676 m)   Wt 226 lb 8 oz (102.7 kg)   LMP  (LMP Unknown) Comment: iregular  BMI 36.56 kg/m  CONSTITUTIONAL: Well-developed, well-nourished female in no acute distress.  HENT:  Normocephalic, atraumatic.  NECK: Normal range of motion, supple, no masses.  Normal thyroid.  SKIN: Skin is warm and dry. No rash noted. Not diaphoretic. No erythema. No pallor. NEUROLGIC: Alert and oriented to person, place, and time. PSYCHIATRIC: Normal mood and affect. Normal behavior. Normal judgment and thought content. CARDIOVASCULAR:Not Examined RESPIRATORY: Not Examined BREASTS: Not Examined ABDOMEN: Soft, non distended; Non tender.  No Organomegaly. PELVIC:  External Genitalia: Normal  BUS: Normal  Vagina: Normal  Cervix: Normal, pink , normal discharge, no odor  MUSCULOSKELETAL: Normal range of motion. No tenderness.  No cyanosis, clubbing, or edema.   Assessment:   1. Screening examination for STD (sexually transmitted disease) 2.Birth control     Plan:   Swab collected , will follow up with results. Discussed change OCP to a different  pill with high hormones to regulate . She denies any contraindications to use.   Pt advised to complete current pack then start new OCP [facility]. Follow up prn for annual exam .   , CNM

## 2022-06-26 ENCOUNTER — Telehealth: Payer: Self-pay | Admitting: Certified Nurse Midwife

## 2022-06-26 ENCOUNTER — Ambulatory Visit: Payer: Medicaid Other

## 2022-06-26 NOTE — Telephone Encounter (Addendum)
Pt would like the results of her recent labs explained . Pt is asking for a return call. [phone]

## 2022-06-27 ENCOUNTER — Other Ambulatory Visit: Payer: Self-pay

## 2022-06-27 ENCOUNTER — Other Ambulatory Visit (HOSPITAL_COMMUNITY)
Admission: RE | Admit: 2022-06-27 | Discharge: 2022-06-27 | Disposition: A | Discharge status: Home / Self Care | Payer: Medicaid Other | Source: Ambulatory Visit | Attending: Certified Nurse Midwife | Admitting: Certified Nurse Midwife

## 2022-06-27 DIAGNOSIS — Z113 Encounter for screening for infections with a predominantly sexual mode of transmission: Secondary | ICD-10-CM

## 2022-06-27 NOTE — Telephone Encounter (Signed)
Swab was collected on 06/20/2022 was not sent to lab until 06/27/2022 as I noticed swab was still in our lab container with no order or requisition , spoke to provider for order, Sent swab and requisition to [facility] facility.

## 2022-06-28 ENCOUNTER — Encounter: Payer: Self-pay | Admitting: Certified Nurse Midwife

## 2022-06-28 ENCOUNTER — Other Ambulatory Visit: Payer: Self-pay | Admitting: Certified Nurse Midwife

## 2022-06-28 LAB — CERVICOVAGINAL ANCILLARY ONLY
Bacterial Vaginitis (gardnerella): POSITIVE — AB
Candida Glabrata: NEGATIVE
Candida Vaginitis: NEGATIVE
Chlamydia: NEGATIVE
Comment: NEGATIVE
Comment: NEGATIVE
Comment: NEGATIVE
Comment: NEGATIVE
Comment: NEGATIVE
Comment: NORMAL
Neisseria Gonorrhea: NEGATIVE
Trichomonas: NEGATIVE

## 2022-06-28 MED ORDER — METRONIDAZOLE 500 MG PO TABS: 500.0000 mg | ORAL_TABLET | Freq: Two times a day (BID) | ORAL | 0 refills | Status: AC

## 2022-07-03 DIAGNOSIS — Z419 Encounter for procedure for purposes other than remedying health state, unspecified: Secondary | ICD-10-CM | POA: Diagnosis not present

## 2022-07-06 ENCOUNTER — Other Ambulatory Visit: Payer: Self-pay

## 2022-07-17 ENCOUNTER — Ambulatory Visit: Payer: Medicaid Other | Admitting: Internal Medicine

## 2022-07-19 ENCOUNTER — Ambulatory Visit: Payer: Medicaid Other | Admitting: Nurse Practitioner

## 2022-07-24 ENCOUNTER — Ambulatory Visit: Payer: Medicaid Other

## 2022-08-03 DIAGNOSIS — Z419 Encounter for procedure for purposes other than remedying health state, unspecified: Secondary | ICD-10-CM | POA: Diagnosis not present

## 2022-08-18 ENCOUNTER — Ambulatory Visit: Payer: Medicaid Other

## 2022-08-23 ENCOUNTER — Ambulatory Visit: Payer: Medicaid Other | Admitting: Advanced Practice Midwife

## 2022-08-23 ENCOUNTER — Encounter: Payer: Self-pay | Admitting: Advanced Practice Midwife

## 2022-08-23 DIAGNOSIS — Z113 Encounter for screening for infections with a predominantly sexual mode of transmission: Secondary | ICD-10-CM

## 2022-08-23 LAB — WET PREP FOR TRICH, YEAST, CLUE
Trichomonas Exam: NEGATIVE
Yeast Exam: NEGATIVE

## 2022-08-23 NOTE — Progress Notes (Signed)
Pt is here for STD screening.  Wet mount results reviewed, no treatment required.  Condoms declined.  Kia Stavros M Keyundra Fant, RN  

## 2022-08-23 NOTE — Progress Notes (Signed)
[facility] Department  STI clinic/screening visit [place] [id number] [phone]  Subjective:  Kathryn Nash is a 26 y.o. SBF G2P0 nonsmoker female being seen today for an STI screening visit. The patient reports they do have symptoms.  Patient reports that they do not desire a pregnancy in the next year.   They reported they are not interested in discussing contraception today.    Patient's last menstrual period was 07/24/2022 (approximate).   Patient has the following medical conditions:   Patient Active Problem List   Diagnosis Date Noted   Acute right-sided low back pain with right-sided sciatica 06/18/2022   Leukemoid reaction 06/18/2022   OSA (obstructive sleep apnea) 05/14/2022   Morbid obesity ([place]) 226 lbs BMI=37.2 10/20/2019   Asthma exacerbation 12/08/2018    Chief Complaint  Patient presents with   SEXUALLY TRANSMITTED DISEASE    Screening- patient believes she has B.V patient is complaining of discharge     HPI  Patient reports thinks she has BV because "when I pee I see cloudy d/c in the toilet" x1 mo. LMP 07/24/22. Last sex last night without condom; with current partner x 8 mo; 1 partner in last 3 mo. On ocp's. Last MJ 2021. Last ETOH 07/2022 (1 shot liquor) "occasionally".   Last HIV test per patient/review of record was ? Patient reports last pap was 07/06/21 neg   Screening for MPX risk: Does the patient have an unexplained rash? No Is the patient MSM? No Does the patient endorse multiple sex partners or anonymous sex partners? No Did the patient have close or sexual contact with a person diagnosed with MPX? No Has the patient traveled outside the [place] where MPX is endemic? No Is there a high clinical suspicion for MPX-- evidenced by one of the following No  -Unlikely to be chickenpox  -Lymphadenopathy  -Rash that present in same phase of evolution on any given body part See flowsheet for further details and  programmatic requirements.   Immunization history:  Immunization History  Administered Date(s) Administered   Hepatitis A 08/11/2009, 07/22/2013   Hepatitis B 06-13-1996, 06/26/1996, 06/14/1997   Hpv-Unspecified 08/24/2008, 08/11/2009, 09/06/2011   MMR 08/25/1997, 07/22/2013   Meningococcal Conjugate 08/11/2009   Meningococcal Mcv4o 07/22/2013   PPD Test 04/24/2021   Tdap 02/14/2018   Varicella 06/14/1997, 08/24/2008     The following portions of the patient's history were reviewed and updated as appropriate: allergies, current medications, past medical history, past social history, past surgical history and problem list.  Objective:  There were no vitals filed for this visit.  Physical Exam Vitals and nursing note reviewed.  Constitutional:      Appearance: Normal appearance. She is obese.  HENT:     Head: Normocephalic and atraumatic.     Mouth/Throat:     Mouth: Mucous membranes are moist.     Pharynx: Oropharynx is clear. No oropharyngeal exudate or posterior oropharyngeal erythema.  Eyes:     Conjunctiva/sclera: Conjunctivae normal.  Pulmonary:     Effort: Pulmonary effort is normal.  Abdominal:     Palpations: Abdomen is soft. There is no mass.     Tenderness: There is no abdominal tenderness. There is no rebound.     Comments: Soft without masses or tenderness, fair tone  Genitourinary:    General: Normal vulva.     Exam position: Lithotomy position.     Pubic Area: No rash or pubic lice.      Labia:  Right: No rash or lesion.        Left: No rash or lesion.      Vagina: Vaginal discharge (grey creamy leukorrhea, ph<4.5 with sl malodor) present. No erythema, bleeding or lesions.     Cervix: Normal.     Uterus: Normal.      Adnexa: Right adnexa normal and left adnexa normal.     Rectum: Normal.     Comments: pH = <4.5 Lymphadenopathy:     Head:     Right side of head: No preauricular or posterior auricular adenopathy.     Left side of head: No  preauricular or posterior auricular adenopathy.     Cervical: No cervical adenopathy.     Right cervical: No superficial, deep or posterior cervical adenopathy.    Left cervical: No superficial, deep or posterior cervical adenopathy.     Upper Body:     Right upper body: No supraclavicular, axillary or epitrochlear adenopathy.     Left upper body: No supraclavicular, axillary or epitrochlear adenopathy.     Lower Body: No right inguinal adenopathy. No left inguinal adenopathy.  Skin:    General: Skin is warm and dry.     Findings: No rash.  Neurological:     Mental Status: She is alert and oriented to person, place, and time.     Assessment and Plan:  QUINTAVIA ROGSTAD is a 26 y.o. female presenting to the [facility] Department for STI screening  1. Screening examination for venereal disease Treat wet mount per standing orders Immunization nurse consult  - WET PREP FOR [place], YEAST, [facility]     No follow-ups on file.  Future Appointments  Date Time Provider [place]  09/13/2022  8:30 AM Flora Lipps, MD [id number] None    Herbie Saxon, CNM

## 2022-09-02 DIAGNOSIS — Z419 Encounter for procedure for purposes other than remedying health state, unspecified: Secondary | ICD-10-CM | POA: Diagnosis not present

## 2022-09-13 ENCOUNTER — Institutional Professional Consult (permissible substitution): Payer: Medicaid Other | Admitting: Internal Medicine

## 2022-09-24 ENCOUNTER — Ambulatory Visit: Payer: Medicaid Other

## 2022-10-03 DIAGNOSIS — Z419 Encounter for procedure for purposes other than remedying health state, unspecified: Secondary | ICD-10-CM | POA: Diagnosis not present

## 2022-10-04 ENCOUNTER — Ambulatory Visit: Payer: Self-pay

## 2022-10-05 ENCOUNTER — Ambulatory Visit
Admission: EM | Admit: 2022-10-05 | Discharge: 2022-10-05 | Disposition: A | Discharge status: Home / Self Care | Payer: Medicaid Other | Attending: Emergency Medicine | Admitting: Emergency Medicine

## 2022-10-05 DIAGNOSIS — N898 Other specified noninflammatory disorders of vagina: Secondary | ICD-10-CM | POA: Insufficient documentation

## 2022-10-05 DIAGNOSIS — Z113 Encounter for screening for infections with a predominantly sexual mode of transmission: Secondary | ICD-10-CM | POA: Diagnosis not present

## 2022-10-05 DIAGNOSIS — Z3202 Encounter for pregnancy test, result negative: Secondary | ICD-10-CM | POA: Insufficient documentation

## 2022-10-05 LAB — POCT URINALYSIS DIP (MANUAL ENTRY)
Bilirubin, UA: NEGATIVE
Blood, UA: NEGATIVE
Glucose, UA: NEGATIVE mg/dL
Ketones, POC UA: NEGATIVE mg/dL
Leukocytes, UA: NEGATIVE
Nitrite, UA: NEGATIVE
Protein Ur, POC: NEGATIVE mg/dL
Spec Grav, UA: 1.025 (ref 1.010–1.025)
Urobilinogen, UA: 0.2 E.U./dL
pH, UA: 6.5 (ref 5.0–8.0)

## 2022-10-05 LAB — POCT URINE PREGNANCY: Preg Test, Ur: NEGATIVE

## 2022-10-05 NOTE — ED Triage Notes (Signed)
Patient to Urgent Care with complaints of possible UTI and request for pregnancy and STD testing.   Reports symptoms started last night- reports cloudy urine. Denies any burning or pain with urination. Reports some vaginal discharge.

## 2022-10-05 NOTE — Discharge Instructions (Signed)
Your urine pregnancy test is negative.  Urine is normal.    Your vaginal tests are pending.  If your test results are positive, we will call you.  You and your sexual partner(s) may require treatment at that time.  Do not have sexual activity for at least 7 days.    Follow up with your primary care provider if your symptoms are not improving.

## 2022-10-05 NOTE — ED Provider Notes (Signed)
[facility]    CSN: [id number] Arrival date & time: 10/05/22  0834      History   Chief Complaint Chief Complaint  Patient presents with   SEXUALLY TRANSMITTED DISEASE   Dysuria    HPI NASIA CANNAN is a 26 y.o. female.  Patient presents with 1 day history of cloudy urine and clear vaginal discharge.  She denies fever, chills, abdominal pain, pelvic pain, dysuria, hematuria, or other symptoms.  No treatment at home.    The history is provided by the patient and medical records.    Past Medical History:  Diagnosis Date   Asthma    Chlamydia     Patient Active Problem List   Diagnosis Date Noted   Acute right-sided low back pain with right-sided sciatica 06/18/2022   Leukemoid reaction 06/18/2022   OSA (obstructive sleep apnea) 05/14/2022   Morbid obesity ([place]) 226 lbs BMI=37.2 10/20/2019   Asthma exacerbation 12/08/2018    Past Surgical History:  Procedure Laterality Date   NO PAST SURGERIES      OB History     Gravida  2   Para      Term      Preterm      AB  1   Living  0      SAB  1   IAB      Ectopic      Multiple      Live Births               Home Medications    Prior to Admission medications   Medication Sig Start Date End Date Taking? Authorizing Provider  albuterol (VENTOLIN HFA) 108 (90 Base) MCG/ACT inhaler Inhale 2 puffs into the lungs in the morning and at bedtime. 05/14/22   Cletis Athens, MD  budesonide-formoterol (SYMBICORT) 80-4.5 MCG/ACT inhaler Inhale 2 puffs into the lungs in the morning and at bedtime. 06/18/22   Cletis Athens, MD  fluticasone furoate-vilanterol (BREO ELLIPTA) 100-25 MCG/ACT AEPB Inhale 1 puff into the lungs daily. 05/14/22   Cletis Athens, MD  Norethindrone Acetate-Ethinyl Estradiol (JUNEL 1.5/30) 1.5-30 MG-MCG tablet Take 1 tablet by mouth daily. 06/20/22   Philip Aspen, CNM    Family History Family History  Problem Relation Age of Onset   Asthma Mother    Asthma Father    Asthma  Maternal Grandmother     Social History Social History   Tobacco Use   Smoking status: Never   Smokeless tobacco: Never   Tobacco comments:    Never used e-cigarettes  Vaping Use   Vaping Use: Never used  Substance Use Topics   Alcohol use: Not Currently    Alcohol/week: 1.0 standard drink of alcohol    Types: 1 Shots of liquor per week    Comment: last use 07/2022 "occassionally"   Drug use: Not Currently    Types: Marijuana    Comment: last use 2021     Allergies   Eggs or egg-derived products, Iohexol, Montelukast sodium, and Penicillins   Review of Systems Review of Systems  Constitutional:  Negative for chills and fever.  Gastrointestinal:  Negative for abdominal pain, diarrhea and vomiting.  Genitourinary:  Positive for vaginal discharge. Negative for dysuria, flank pain, hematuria and pelvic pain.  Skin:  Negative for rash.  All other systems reviewed and are negative.    Physical Exam Triage Vital Signs ED Triage Vitals [10/05/22 0919]  Enc Vitals Group     BP  Pulse Rate 76     Resp 18     Temp 98.4 F (36.9 C)     Temp src      SpO2 97 %     Weight      Height      Head Circumference      Peak Flow      Pain Score      Pain Loc      Pain Edu?      Excl. in GC?    No data found.  Updated Vital Signs BP 122/78   Pulse 76   Temp 98.4 F (36.9 C)   Resp 18   Ht 5\' 6"  (1.676 m)   Wt 226 lb (102.5 kg)   LMP 08/25/2022   SpO2 97%   BMI 36.48 kg/m   Visual Acuity Right Eye Distance:   Left Eye Distance:   Bilateral Distance:    Right Eye Near:   Left Eye Near:    Bilateral Near:     Physical Exam Vitals and nursing note reviewed.  Constitutional:      General: She is not in acute distress.    Appearance: She is well-developed. She is not ill-appearing.  HENT:     Mouth/Throat:     Mouth: Mucous membranes are moist.  Cardiovascular:     Rate and Rhythm: Normal rate and regular rhythm.     Heart sounds: Normal heart  sounds.  Pulmonary:     Effort: Pulmonary effort is normal. No respiratory distress.     Breath sounds: Normal breath sounds.  Abdominal:     General: Bowel sounds are normal.     Palpations: Abdomen is soft.     Tenderness: There is no abdominal tenderness. There is no right CVA tenderness, left CVA tenderness, guarding or rebound.  Musculoskeletal:     Cervical back: Neck supple.  Skin:    General: Skin is warm and dry.  Neurological:     Mental Status: She is alert.  Psychiatric:        Mood and Affect: Mood normal.        Behavior: Behavior normal.      UC Treatments / Results  Labs (all labs ordered are listed, but only abnormal results are displayed) Labs Reviewed  POCT URINALYSIS DIP (MANUAL ENTRY)  POCT URINE PREGNANCY  CERVICOVAGINAL ANCILLARY ONLY    EKG   Radiology No results found.  Procedures Procedures (including critical care time)  Medications Ordered in UC Medications - No data to display  Initial Impression / Assessment and Plan / UC Course  I have reviewed the triage vital signs and the nursing notes.  Pertinent labs & imaging results that were available during my care of the patient were reviewed by me and considered in my medical decision making (see chart for details).    STD screening, negative pregnancy test, vaginal discharge.  Urine normal.  Urine pregnancy negative.  Patient obtained vaginal self swab for testing.  Discussed that we will call if test results are positive.  Discussed that she may require treatment at that time.  Discussed that sexual partner(s) may also require treatment.  Instructed patient to abstain from sexual activity for at least 7 days.  Instructed her to follow-up with her PCP or gynecologist if her symptoms are not improving.  Patient agrees to plan of care.   Final Clinical Impressions(s) / UC Diagnoses   Final diagnoses:  Screening for STD (sexually transmitted disease)  Negative pregnancy test  Vaginal  discharge     Discharge Instructions      Your urine pregnancy test is negative.  Urine is normal.    Your vaginal tests are pending.  If your test results are positive, we will call you.  You and your sexual partner(s) may require treatment at that time.  Do not have sexual activity for at least 7 days.    Follow up with your primary care provider if your symptoms are not improving.         ED Prescriptions   None    PDMP not reviewed this encounter.   Mickie Bail, NP 10/05/22 [phone]

## 2022-10-07 ENCOUNTER — Telehealth: Payer: Medicaid Other | Admitting: Family

## 2022-10-07 DIAGNOSIS — J45901 Unspecified asthma with (acute) exacerbation: Secondary | ICD-10-CM | POA: Diagnosis not present

## 2022-10-07 MED ORDER — CETIRIZINE HCL 10 MG PO TABS: 10.0000 mg | ORAL_TABLET | Freq: Every day | ORAL | 1 refills | Status: DC

## 2022-10-07 MED ORDER — PREDNISONE 20 MG PO TABS: 40.0000 mg | ORAL_TABLET | Freq: Every day | ORAL | 0 refills | Status: AC

## 2022-10-07 NOTE — Progress Notes (Signed)
Virtual Visit Consent   Kathryn Nash, you are scheduled for a virtual visit with a Kathryn Nash provider today. Just as with appointments in the office, your consent must be obtained to participate. Your consent will be active for this visit and any virtual visit you may have with one of our providers in the next 365 days. If you have a MyChart account, a copy of this consent can be sent to you electronically.  As this is a virtual visit, video technology does not allow for your provider to perform a traditional examination. This may limit your provider's ability to fully assess your condition. If your provider identifies any concerns that need to be evaluated in person or the need to arrange testing (such as labs, EKG, etc.), we will make arrangements to do so. Although advances in technology are sophisticated, we cannot ensure that it will always work on either your end or our end. If the connection with a video visit is poor, the visit may have to be switched to a telephone visit. With either a video or telephone visit, we are not always able to ensure that we have a secure connection.  By engaging in this virtual visit, you consent to the provision of healthcare and authorize for your insurance to be billed (if applicable) for the services provided during this visit. Depending on your insurance coverage, you may receive a charge related to this service.  I need to obtain your verbal consent now. Are you willing to proceed with your visit today? [name] has provided verbal consent on 10/07/2022 for a virtual visit (video or telephone). Kathryn Rodney, FNP  Date: 10/07/2022 8:33 AM  Virtual Visit via Video Note   I, Kathryn Nash, connected with  Kathryn Nash  ([id number], 09-02-1996) on 10/07/22 at  8:45 AM EST by a video-enabled telemedicine application and verified that I am speaking with the correct person using two identifiers.  Location: Patient: Virtual Visit Location Patient:  Home Provider: Virtual Visit Location Provider: Home Office   I discussed the limitations of evaluation and management by telemedicine and the availability of in person appointments. The patient expressed understanding and agreed to proceed.    History of Present Illness: Kathryn Nash is a 26 y.o. who identifies as a female who was assigned female at birth, and is being seen today for asthma flare up.  HPI: Asthma She complains of chest tightness, cough, difficulty breathing, frequent throat clearing, shortness of breath and sputum production. There is no wheezing. This is a recurrent problem. The current episode started 1 to 4 weeks ago. The problem occurs intermittently. The cough is productive of sputum. Associated symptoms include dyspnea on exertion and nasal congestion. Pertinent negatives include no chest pain, ear congestion, ear pain, fever or headaches. Her symptoms are alleviated by rest. Her past medical history is significant for asthma.    Problems:  Patient Active Problem List   Diagnosis Date Noted   Acute right-sided low back pain with right-sided sciatica 06/18/2022   Leukemoid reaction 06/18/2022   OSA (obstructive sleep apnea) 05/14/2022   Morbid obesity ([facility]) 226 lbs BMI=37.2 10/20/2019   Asthma exacerbation 12/08/2018    Allergies:  Allergies  Allergen Reactions   Eggs Or Egg-Derived Products Hives   Iohexol Hives   Montelukast Sodium Hives    Singulair   Penicillins Rash    Has patient had a PCN reaction causing immediate rash, facial/tongue/throat swelling, SOB or lightheadedness with hypotension: Unknown Has patient had  a PCN reaction causing severe rash involving mucus membranes or skin necrosis: Unknown Has patient had a PCN reaction that required hospitalization: Unknown Has patient had a PCN reaction occurring within the last 10 years: Unknown If all of the above answers are "NO", then may proceed with Cephalosporin use.    Medications:  Current  Outpatient Medications:    predniSONE (DELTASONE) 20 MG tablet, Take 2 tablets (40 mg total) by mouth daily with breakfast for 5 days., Disp: 10 tablet, Rfl: 0   albuterol (VENTOLIN HFA) 108 (90 Base) MCG/ACT inhaler, Inhale 2 puffs into the lungs in the morning and at bedtime., Disp: 8 g, Rfl: 6   budesonide-formoterol (SYMBICORT) 80-4.5 MCG/ACT inhaler, Inhale 2 puffs into the lungs in the morning and at bedtime., Disp: 10.2 g, Rfl: 6   fluticasone furoate-vilanterol (BREO ELLIPTA) 100-25 MCG/ACT AEPB, Inhale 1 puff into the lungs daily., Disp: 28 each, Rfl: 6   Norethindrone Acetate-Ethinyl Estradiol (JUNEL 1.5/30) 1.5-30 MG-MCG tablet, Take 1 tablet by mouth daily., Disp: 28 tablet, Rfl: 11  Observations/Objective: Patient is well-developed, well-nourished in no acute distress.  Resting comfortably  at home.  Head is normocephalic, atraumatic.  No labored breathing.  Speech is clear and coherent with logical content.  Patient is alert and oriented at baseline.  Nasal congestion   Assessment and Plan: 1. Exacerbation of persistent asthma, unspecified asthma severity - predniSONE (DELTASONE) 20 MG tablet; Take 2 tablets (40 mg total) by mouth daily with breakfast for 5 days.  Dispense: 10 tablet; Refill: 0  Continue Symbicort BID and albuterol as needed Start prednisone  Start Zyrtec Avoid allergens  Follow up if symptoms worsen or do not improve   Follow Up Instructions: I discussed the assessment and treatment plan with the patient. The patient was provided an opportunity to ask questions and all were answered. The patient agreed with the plan and demonstrated an understanding of the instructions.  A copy of instructions were sent to the patient via MyChart unless otherwise noted below.     The patient was advised to call back or seek an in-person evaluation if the symptoms worsen or if the condition fails to improve as anticipated.  Time:  I spent 6 minutes with the patient via  telehealth technology discussing the above problems/concerns.    Kathryn Dun, FNP

## 2022-10-07 NOTE — Patient Instructions (Signed)
Asthma, Adult ? ?Asthma is a long-term (chronic) condition that causes recurrent episodes in which the lower airways in the lungs become tight and narrow. The narrowing is caused by inflammation and tightening of the smooth muscle around the lower airways. ?Asthma episodes, also called asthma attacks or asthma flares, may cause coughing, making high-pitched whistling sounds when you breathe, most often when you breathe out (wheezing), shortness of breath, and chest pain. The airways may produce extra mucus caused by the inflammation and irritation. During an attack, it can be difficult to breathe. Asthma attacks can range from minor to life-threatening. ?Asthma cannot be cured, but medicines and lifestyle changes can help control it and treat acute attacks. It is important to keep your asthma well controlled so the condition does not interfere with your daily life. ?What are the causes? ?This condition is believed to be caused by inherited (genetic) and environmental factors, but its exact cause is not known. ?What can trigger an asthma attack? ?Many things can bring on an asthma attack or make symptoms worse. These triggers are different for every person. Common triggers include: ?Allergens and irritants like mold, dust, pet dander, cockroaches, pollen, air pollution, and chemical odors. ?Cigarette smoke. ?Weather changes and cold air. ?Stress and strong emotional responses such as crying or laughing hard. ?Certain medications such as aspirin or beta blockers. ?Infections and inflammatory conditions, such as the flu, a cold, pneumonia, or inflammation of the nasal membranes (rhinitis). ?Gastroesophageal reflux disease (GERD). ?What are the signs or symptoms? ?Symptoms may occur right after exposure to an asthma trigger or hours later and can vary by person. Common signs and symptoms include: ?Wheezing. ?Trouble breathing (shortness of breath). ?Excessive nighttime or early morning coughing. ?Chest  tightness. ?Tiredness (fatigue) with minimal activity. ?Difficulty talking in complete sentences. ?Poor exercise tolerance. ?How is this diagnosed? ?This condition is diagnosed based on: ?A physical exam and your medical history. ?Tests, which may include: ?Lung function studies to evaluate the flow of air in your lungs. ?Allergy tests. ?Imaging tests, such as X-rays. ?How is this treated? ?There is no cure, but symptoms can be controlled with proper treatment. Treatment usually involves: ?Identifying and avoiding your asthma triggers. ?Inhaled medicines. Two types are commonly used to treat asthma, depending on severity: ?Controller medicines. These help prevent asthma symptoms from occurring. They are taken every day. ?Fast-acting reliever or rescue medicines. These quickly relieve asthma symptoms. They are used as needed and provide short-term relief. ?Using other medicines, such as: ?Allergy medicines, such as antihistamines, if your asthma attacks are triggered by allergens. ?Immune medicines (immunomodulators). These are medicines that help control the immune system. ?Using supplemental oxygen. This is only needed during a severe episode. ?Creating an asthma action plan. An asthma action plan is a written plan for managing and treating your asthma attacks. This plan includes: ?A list of your asthma triggers and how to avoid them. ?Information about when medicines should be taken and when their dosage should be changed. ?Instructions about using a device called a peak flow meter. A peak flow meter measures how well the lungs are working and the severity of your asthma. It helps you monitor your condition. ?Follow these instructions at home: ?Take over-the-counter and prescription medicines only as told by your health care provider. ?Stay up to date on all vaccinations as recommended by your healthcare provider, including vaccines for the flu and pneumonia. ?Use a peak flow meter and keep track of your peak flow  readings. ?Understand and use your asthma   action plan to address any asthma flares. ?Do not smoke or allow anyone to smoke in your home. ?Contact a health care provider if: ?You have wheezing, shortness of breath, or a cough that is not responding to medicines. ?Your medicines are causing side effects, such as a rash, itching, swelling, or trouble breathing. ?You need to use a reliever medicine more than 2-3 times a week. ?Your peak flow reading is still at 50-79% of your personal best after following your action plan for 1 hour. ?You have a fever and shortness of breath. ?Get help right away if: ?You are getting worse and do not respond to treatment during an asthma attack. ?You are short of breath when at rest or when doing very little physical activity. ?You have difficulty eating, drinking, or talking. ?You have chest pain or tightness. ?You develop a fast heartbeat or palpitations. ?You have a bluish color to your lips or fingernails. ?You are light-headed or dizzy, or you faint. ?Your peak flow reading is less than 50% of your personal best. ?You feel too tired to breathe normally. ?These symptoms may be an emergency. Get help right away. Call 911. ?Do not wait to see if the symptoms will go away. ?Do not drive yourself to the hospital. ?Summary ?Asthma is a long-term (chronic) condition that causes recurrent episodes in which the airways become tight and narrow. Asthma episodes, also called asthma attacks or asthma flares, can cause coughing, wheezing, shortness of breath, and chest pain. ?Asthma cannot be cured, but medicines and lifestyle changes can help keep it well controlled and prevent asthma flares. ?Make sure you understand how to avoid triggers and how and when to use your medicines. ?Asthma attacks can range from minor to life-threatening. Get help right away if you have an asthma attack and do not respond to treatment with your usual rescue medicines. ?This information is not intended to replace  advice given to you by your health care provider. Make sure you discuss any questions you have with your health care provider. ?Document Revised: 09/06/2021 Document Reviewed: 08/28/2021 ?Elsevier Patient Education ? 2023 [employer] ? ?

## 2022-10-08 ENCOUNTER — Telehealth: Payer: Self-pay | Admitting: Emergency Medicine

## 2022-10-08 LAB — CERVICOVAGINAL ANCILLARY ONLY
Bacterial Vaginitis (gardnerella): POSITIVE — AB
Candida Glabrata: NEGATIVE
Candida Vaginitis: NEGATIVE
Chlamydia: NEGATIVE
Comment: NEGATIVE
Comment: NEGATIVE
Comment: NEGATIVE
Comment: NEGATIVE
Comment: NEGATIVE
Comment: NORMAL
Neisseria Gonorrhea: NEGATIVE
Trichomonas: NEGATIVE

## 2022-10-08 MED ORDER — METRONIDAZOLE 0.75 % VA GEL: 1.0000 | Freq: Every day | VAGINAL | 0 refills | Status: AC

## 2022-11-02 DIAGNOSIS — Z419 Encounter for procedure for purposes other than remedying health state, unspecified: Secondary | ICD-10-CM | POA: Diagnosis not present

## 2022-11-05 ENCOUNTER — Ambulatory Visit
Admission: EM | Admit: 2022-11-05 | Discharge: 2022-11-05 | Disposition: A | Discharge status: Home / Self Care | Payer: Medicaid Other | Attending: Urgent Care | Admitting: Urgent Care

## 2022-11-05 DIAGNOSIS — K1120 Sialoadenitis, unspecified: Secondary | ICD-10-CM | POA: Diagnosis not present

## 2022-11-05 MED ORDER — DOXYCYCLINE HYCLATE 100 MG PO CAPS: 100.0000 mg | ORAL_CAPSULE | Freq: Two times a day (BID) | ORAL | 0 refills | Status: AC

## 2022-11-05 MED ORDER — PREDNISONE 20 MG PO TABS: ORAL_TABLET | ORAL | 0 refills | Status: AC

## 2022-11-05 NOTE — ED Provider Notes (Signed)
Kathryn Nash    CSN: [id number] Arrival date & time: 11/05/22  1243      History   Chief Complaint No chief complaint on file.   HPI Kathryn Nash is a 26 y.o. female.   HPI  Presents to urgent care with complaint of acute right ear face and jaw pain.  Symptoms x 3 days.  She denies fever, myalgias, chills.  Denies nausea, vomiting, diarrhea.  She recently went to dentist for procedure where they attached "bite guards".  She denies oral pain with hot and cold.  Past Medical History:  Diagnosis Date  . Asthma   . Chlamydia     Patient Active Problem List   Diagnosis Date Noted  . Acute right-sided low back pain with right-sided sciatica 06/18/2022  . Leukemoid reaction 06/18/2022  . OSA (obstructive sleep apnea) 05/14/2022  . Morbid obesity (HCC) 226 lbs BMI=37.2 10/20/2019  . Asthma exacerbation 12/08/2018    Past Surgical History:  Procedure Laterality Date  . NO PAST SURGERIES      OB History     Gravida  2   Para      Term      Preterm      AB  1   Living  0      SAB  1   IAB      Ectopic      Multiple      Live Births               Home Medications    Prior to Admission medications   Medication Sig Start Date End Date Taking? Authorizing Provider  albuterol (VENTOLIN HFA) 108 (90 Base) MCG/ACT inhaler Inhale 2 puffs into the lungs in the morning and at bedtime. 05/14/22   Corky Downs, MD  budesonide-formoterol (SYMBICORT) 80-4.5 MCG/ACT inhaler Inhale 2 puffs into the lungs in the morning and at bedtime. 06/18/22   Corky Downs, MD  cetirizine (ZYRTEC) 10 MG tablet Take 1 tablet (10 mg total) by mouth daily. 10/07/22   Hawks, Neysa Bonito A, FNP  fluticasone furoate-vilanterol (BREO ELLIPTA) 100-25 MCG/ACT AEPB Inhale 1 puff into the lungs daily. 05/14/22   Corky Downs, MD  Norethindrone Acetate-Ethinyl Estradiol (JUNEL 1.5/30) 1.5-30 MG-MCG tablet Take 1 tablet by mouth daily. 06/20/22   Doreene Burke, CNM    Family  History Family History  Problem Relation Age of Onset  . Asthma Mother   . Asthma Father   . Asthma Maternal Grandmother     Social History Social History   Tobacco Use  . Smoking status: Never  . Smokeless tobacco: Never  . Tobacco comments:    Never used e-cigarettes  Vaping Use  . Vaping Use: Never used  Substance Use Topics  . Alcohol use: Not Currently    Alcohol/week: 1.0 standard drink of alcohol    Types: 1 Shots of liquor per week    Comment: last use 07/2022 "occassionally"  . Drug use: Not Currently    Types: Marijuana    Comment: last use 2021     Allergies   Eggs or egg-derived products, Iohexol, Montelukast sodium, and Penicillins   Review of Systems Review of Systems   Physical Exam Triage Vital Signs ED Triage Vitals [11/05/22 1340]  Enc Vitals Group     BP 110/64     Pulse Rate 70     Resp 16     Temp 98.8 F (37.1 C)     Temp Source Oral  SpO2 96 %     Weight      Height      Head Circumference      Peak Flow      Pain Score      Pain Loc      Pain Edu?      Excl. in GC?    No data found.  Updated Vital Signs BP 110/64 (BP Location: Left Arm)   Pulse 70   Temp 98.8 F (37.1 C) (Oral)   Resp 16   SpO2 96%   Visual Acuity Right Eye Distance:   Left Eye Distance:   Bilateral Distance:    Right Eye Near:   Left Eye Near:    Bilateral Near:     Physical Exam Vitals reviewed.  Constitutional:      Appearance: Normal appearance.  HENT:     Head:     Jaw: Tenderness, swelling and pain on movement present.     Right Ear: Tympanic membrane normal. Tenderness present. Tympanic membrane is not erythematous or bulging.     Ears:     Comments: EAC is erythematous without edema or discharge.  External ear pain with movement.    Mouth/Throat:     Mouth: Mucous membranes are moist.     Dentition: Normal dentition. No dental tenderness, dental caries, dental abscesses or gum lesions.   Skin:    General: Skin is warm and dry.   Neurological:     General: No focal deficit present.     Mental Status: She is alert and oriented to person, place, and time.  Psychiatric:        Mood and Affect: Mood normal.        Behavior: Behavior normal.     UC Treatments / Results  Labs (all labs ordered are listed, but only abnormal results are displayed) Labs Reviewed - No data to display  EKG   Radiology No results found.  Procedures Procedures (including critical care time)  Medications Ordered in UC Medications - No data to display  Initial Impression / Assessment and Plan / UC Course  I have reviewed the triage vital signs and the nursing notes.  Pertinent labs & imaging results that were available during my care of the patient were reviewed by me and considered in my medical decision making (see chart for details).   Suspect salivary gland infection and will cover with antibiotic as well as prednisone to relieve swelling.   Final Clinical Impressions(s) / UC Diagnoses   Final diagnoses:  None   Discharge Instructions   None    ED Prescriptions   None    PDMP not reviewed this encounter.   Charma Igo, [place] 11/05/22 1402

## 2022-11-05 NOTE — Discharge Instructions (Addendum)
Follow up here or with your primary care provider if your symptoms are worsening or not improving with treatment.     

## 2022-11-05 NOTE — ED Notes (Signed)
Triaged by Provider. 

## 2022-11-07 ENCOUNTER — Telehealth: Payer: Medicaid Other | Admitting: Physician Assistant

## 2022-11-07 ENCOUNTER — Telehealth: Payer: Medicaid Other

## 2022-11-07 DIAGNOSIS — T3695XA Adverse effect of unspecified systemic antibiotic, initial encounter: Secondary | ICD-10-CM | POA: Diagnosis not present

## 2022-11-07 DIAGNOSIS — B379 Candidiasis, unspecified: Secondary | ICD-10-CM | POA: Diagnosis not present

## 2022-11-07 MED ORDER — FLUCONAZOLE 150 MG PO TABS: ORAL_TABLET | ORAL | 0 refills | Status: DC

## 2022-11-07 NOTE — Progress Notes (Signed)
I have spent 5 minutes in review of e-visit questionnaire, review and updating patient chart, medical decision making and response to patient.   Estefania Kamiya Cody Julyssa Kyer, PA-C    

## 2022-11-07 NOTE — Progress Notes (Signed)

## 2022-11-30 ENCOUNTER — Other Ambulatory Visit: Payer: Self-pay | Admitting: Internal Medicine

## 2022-11-30 DIAGNOSIS — J45901 Unspecified asthma with (acute) exacerbation: Secondary | ICD-10-CM

## 2022-12-03 DIAGNOSIS — Z419 Encounter for procedure for purposes other than remedying health state, unspecified: Secondary | ICD-10-CM | POA: Diagnosis not present

## 2022-12-26 ENCOUNTER — Other Ambulatory Visit: Payer: Self-pay

## 2022-12-31 ENCOUNTER — Other Ambulatory Visit: Payer: Self-pay | Admitting: Internal Medicine

## 2022-12-31 ENCOUNTER — Telehealth: Payer: Medicaid Other | Admitting: Physician Assistant

## 2022-12-31 ENCOUNTER — Telehealth: Payer: Medicaid Other

## 2022-12-31 DIAGNOSIS — J45901 Unspecified asthma with (acute) exacerbation: Secondary | ICD-10-CM | POA: Diagnosis not present

## 2022-12-31 MED ORDER — BUDESONIDE-FORMOTEROL FUMARATE 80-4.5 MCG/ACT IN AERO: 2.0000 | INHALATION_SPRAY | Freq: Two times a day (BID) | RESPIRATORY_TRACT | 0 refills | Status: DC

## 2022-12-31 MED ORDER — PREDNISONE 20 MG PO TABS: 40.0000 mg | ORAL_TABLET | Freq: Every day | ORAL | 0 refills | Status: DC

## 2022-12-31 NOTE — Progress Notes (Signed)
Visit for Asthma  Based on what you have shared with me, it looks like you may have a flare up of your asthma.  Asthma is a chronic (ongoing) lung disease which results in airway obstruction, inflammation and hyper-responsiveness.   Asthma symptoms vary from person to person, with common symptoms including nighttime awakening and decreased ability to participate in normal activities as a result of shortness of breath. It is often triggered by changes in weather, changes in the season, changes in air temperature, or inside (home, school, daycare or work) allergens such as animal dander, mold, mildew, woodstoves or cockroaches.   It can also be triggered by hormonal changes, extreme emotion, physical exertion or an upper respiratory tract illness.     It is important to identify the trigger, and then eliminate or avoid the trigger if possible.   If you have been prescribed medications to be taken on a regular basis, it is important to follow the asthma action plan and to follow guidelines to adjust medication in response to increasing symptoms of decreased peak expiratory flow rate  Treatment: I have prescribed: Prednisone 40mg  by mouth per day for 5 - 7 days. Take this in addition to your maintenance inhaler and rescue inhaler. Please contact your asthma specialist for follow-up.   HOME CARE Only take medications as instructed by your medical team. Consider wearing a mask or scarf to improve breathing air temperature have been shown to decrease irritation and decrease exacerbations Get rest. Taking a steamy shower or using a humidifier may help nasal congestion sand ease sore throat pain. You can place a towel over your head and breathe in the steam from hot water coming from a faucet. Using a saline nasal spray works much the same way.  Cough drops, hare candies and sore throat lozenges  may ease your cough.  Avoid close contacts especially the very you and the elderly Cover your mouth if you cough or sneeze Always remember to wash your hands.    GET HELP RIGHT AWAY IF: You develop worsening symptoms; breathlessness at rest, drowsy, confused or agitated, unable to speak in full sentences You have coughing fits You develop a severe headache or visual changes You develop shortness of breath, difficulty breathing or start having chest pain Your symptoms persist after you have completed your treatment plan If your symptoms do not improve within 10 days  MAKE SURE YOU Understand these instructions. Will watch your condition. Will get help right away if you are not doing well or get worse.   Your e-visit answers were reviewed by a board certified advanced clinical practitioner to complete your personal care plan, Depending upon the condition, your plan could have included both over the counter or prescription medications.   Please review your pharmacy choice. Your safety is important to [place]. If you have drug allergies check your prescription carefully.  You can use MyChart to ask questions about today's visit, request a non-urgent  call back, or ask for a work or school excuse for 24 hours related to this e-Visit. If it has been greater than 24 hours you will need to follow up with your provider, or enter a new e-Visit to address those concerns.   You will get an e-mail in the next two days asking about your experience. I hope that your e-visit has been valuable and will speed your recovery. Thank you for using e-visits.

## 2022-12-31 NOTE — Progress Notes (Signed)
I have spent 5 minutes in review of e-visit questionnaire, review and updating patient chart, medical decision making and response to patient.   Krishauna Schatzman Cody Neliah Cuyler, PA-C    

## 2023-01-03 DIAGNOSIS — Z419 Encounter for procedure for purposes other than remedying health state, unspecified: Secondary | ICD-10-CM | POA: Diagnosis not present

## 2023-02-01 DIAGNOSIS — Z419 Encounter for procedure for purposes other than remedying health state, unspecified: Secondary | ICD-10-CM | POA: Diagnosis not present

## 2023-02-03 ENCOUNTER — Telehealth: Payer: Medicaid Other | Admitting: Family

## 2023-02-03 DIAGNOSIS — J45901 Unspecified asthma with (acute) exacerbation: Secondary | ICD-10-CM | POA: Diagnosis not present

## 2023-02-03 MED ORDER — ALBUTEROL SULFATE HFA 108 (90 BASE) MCG/ACT IN AERS: 2.0000 | INHALATION_SPRAY | Freq: Two times a day (BID) | RESPIRATORY_TRACT | 6 refills | Status: DC

## 2023-02-03 MED ORDER — BUDESONIDE-FORMOTEROL FUMARATE 80-4.5 MCG/ACT IN AERO: 2.0000 | INHALATION_SPRAY | Freq: Two times a day (BID) | RESPIRATORY_TRACT | 0 refills | Status: DC

## 2023-02-03 NOTE — Progress Notes (Signed)
Virtual Visit Consent   Kathryn Nash, you are scheduled for a virtual visit with a [facility] provider today. Just as with appointments in the office, your consent must be obtained to participate. Your consent will be active for this visit and any virtual visit you may have with one of our providers in the next 365 days. If you have a MyChart account, a copy of this consent can be sent to you electronically.  As this is a virtual visit, video technology does not allow for your provider to perform a traditional examination. This may limit your provider's ability to fully assess your condition. If your provider identifies any concerns that need to be evaluated in person or the need to arrange testing (such as labs, EKG, etc.), we will make arrangements to do so. Although advances in technology are sophisticated, we cannot ensure that it will always work on either your end or our end. If the connection with a video visit is poor, the visit may have to be switched to a telephone visit. With either a video or telephone visit, we are not always able to ensure that we have a secure connection.  By engaging in this virtual visit, you consent to the provision of healthcare and authorize for your insurance to be billed (if applicable) for the services provided during this visit. Depending on your insurance coverage, you may receive a charge related to this service.  I need to obtain your verbal consent now. Are you willing to proceed with your visit today? Kathryn Nash has provided verbal consent on 02/03/2023 for a virtual visit (video or telephone). Kathryn Dun, FNP  Date: 02/03/2023 11:30 AM  Virtual Visit via Video Note   I, Kathryn Nash, connected with  Kathryn Nash  ([id number], [birth year]) on 02/03/23 at 11:30 AM EST by a video-enabled telemedicine application and verified that I am speaking with the correct person using two identifiers.  Location: Patient: Virtual Visit Location Patient: Other:  work Provider: Ecologist: Home Office   I discussed the limitations of evaluation and management by telemedicine and the availability of in person appointments. The patient expressed understanding and agreed to proceed.    History of Present Illness: Kathryn Nash is a 27 y.o. who identifies as a female who was assigned female at birth, and is being seen today for asthma. She reports her PCP has closed down and looking for new PCP, but needs refill on her inhalers.   HPI: Asthma She complains of cough, shortness of breath and wheezing. This is a chronic problem. The current episode started more than 1 year ago. The problem has been waxing and waning. Her symptoms are not alleviated by rest. Her past medical history is significant for asthma.    Problems:  Patient Active Problem List   Diagnosis Date Noted   Acute right-sided low back pain with right-sided sciatica 06/18/2022   Leukemoid reaction 06/18/2022   OSA (obstructive sleep apnea) 05/14/2022   Morbid obesity ([facility]) 226 lbs BMI=37.2 10/20/2019   Asthma exacerbation 12/08/2018    Allergies:  Allergies  Allergen Reactions   Eggs Or Egg-Derived Products Hives   Iohexol Hives   Montelukast Sodium Hives    Singulair   Metronidazole Other (See Comments)    Gel for BV treatment. Developed vaginal bleeding with clots.   Penicillins Rash    Has patient had a PCN reaction causing immediate rash, facial/tongue/throat swelling, SOB or lightheadedness with hypotension: Unknown Has patient had a  PCN reaction causing severe rash involving mucus membranes or skin necrosis: Unknown Has patient had a PCN reaction that required hospitalization: Unknown Has patient had a PCN reaction occurring within the last 10 years: Unknown If all of the above answers are "NO", then may proceed with Cephalosporin use.    Medications:  Current Outpatient Medications:    albuterol (VENTOLIN HFA) 108 (90 Base) MCG/ACT inhaler, Inhale 2  puffs into the lungs 2 (two) times daily. in the morning and at bedtime., Disp: 18 each, Rfl: 6   budesonide-formoterol (SYMBICORT) 80-4.5 MCG/ACT inhaler, Inhale 2 puffs into the lungs in the morning and at bedtime., Disp: 10.2 g, Rfl: 0   cetirizine (ZYRTEC) 10 MG tablet, Take 1 tablet (10 mg total) by mouth daily., Disp: 90 tablet, Rfl: 1   Norethindrone Acetate-Ethinyl Estradiol (JUNEL 1.5/30) 1.5-30 MG-MCG tablet, Take 1 tablet by mouth daily., Disp: 28 tablet, Rfl: 11  Observations/Objective: Patient is well-developed, well-nourished in no acute distress.  Resting comfortably  at home.  Head is normocephalic, atraumatic.  No labored breathing.  Speech is clear and coherent with logical content.  Patient is alert and oriented at baseline.    Assessment and Plan: 1. Exacerbation of persistent asthma, unspecified asthma severity - budesonide-formoterol (SYMBICORT) 80-4.5 MCG/ACT inhaler; Inhale 2 puffs into the lungs in the morning and at bedtime.  Dispense: 10.2 g; Refill: 0 - albuterol (VENTOLIN HFA) 108 (90 Base) MCG/ACT inhaler; Inhale 2 puffs into the lungs 2 (two) times daily. in the morning and at bedtime.  Dispense: 18 each; Refill: 6  Avoid allergens  Symbicort BID Follow up with new PCP Report any increased SOB or wheezing Continue zyrtec   Follow Up Instructions: I discussed the assessment and treatment plan with the patient. The patient was provided an opportunity to ask questions and all were answered. The patient agreed with the plan and demonstrated an understanding of the instructions.  A copy of instructions were sent to the patient via MyChart unless otherwise noted below.     The patient was advised to call back or seek an in-person evaluation if the symptoms worsen or if the condition fails to improve as anticipated.  Time:  I spent 9 minutes with the patient via telehealth technology discussing the above problems/concerns.    Kathryn Dun, FNP

## 2023-02-15 ENCOUNTER — Other Ambulatory Visit: Payer: Medicaid Other

## 2023-02-22 ENCOUNTER — Telehealth: Payer: Medicaid Other | Admitting: Nurse Practitioner

## 2023-02-22 DIAGNOSIS — N760 Acute vaginitis: Secondary | ICD-10-CM

## 2023-02-22 DIAGNOSIS — B9689 Other specified bacterial agents as the cause of diseases classified elsewhere: Secondary | ICD-10-CM

## 2023-02-22 MED ORDER — CLINDAMYCIN PHOSPHATE 2 % VA GEL: 1.0000 | Freq: Every day | VAGINAL | 0 refills | Status: AC

## 2023-02-22 NOTE — Progress Notes (Signed)
E-Visit for Vaginal Symptoms  We are sorry that you are not feeling well. Here is how we plan to help! Based on what you shared with me it looks like you: May have a vaginosis due to bacteria  Vaginosis is an inflammation of the vagina that can result in discharge, itching and pain. The cause is usually a change in the normal balance of vaginal bacteria or an infection. Vaginosis can also result from reduced estrogen levels after menopause.  The most common causes of vaginosis are:   Bacterial vaginosis which results from an overgrowth of one on several organisms that are normally present in your vagina.   Yeast infections which are caused by a naturally occurring fungus called candida.   Vaginal atrophy (atrophic vaginosis) which results from the thinning of the vagina from reduced estrogen levels after menopause.   Trichomoniasis which is caused by a parasite and is commonly transmitted by sexual intercourse.  Factors that increase your risk of developing vaginosis include: Medications, such as antibiotics and steroids Uncontrolled diabetes Use of hygiene products such as bubble bath, vaginal spray or vaginal deodorant Douching Wearing damp or tight-fitting clothing Using an intrauterine device (IUD) for birth control Hormonal changes, such as those associated with pregnancy, birth control pills or menopause Sexual activity Having a sexually transmitted infection  Your treatment plan is Clindamycin vaginal cream 5 grams applied vaginally for 7 days.  I have electronically sent this prescription into the pharmacy that you have chosen.  Be sure to take all of the medication as directed. Stop taking any medication if you develop a rash, tongue swelling or shortness of breath. Mothers who are breast feeding should consider pumping and discarding their breast milk while on these antibiotics. However, there is no consensus that infant exposure at these doses would be harmful.  Remember  that medication creams can weaken latex condoms. Marland Kitchen   HOME CARE:  Good hygiene may prevent some types of vaginosis from recurring and may relieve some symptoms:  Avoid baths, hot tubs and whirlpool spas. Rinse soap from your outer genital area after a shower, and dry the area well to prevent irritation. Don't use scented or harsh soaps, such as those with deodorant or antibacterial action. Avoid irritants. These include scented tampons and pads. Wipe from front to back after using the toilet. Doing so avoids spreading fecal bacteria to your vagina.  Other things that may help prevent vaginosis include:  Don't douche. Your vagina doesn't require cleansing other than normal bathing. Repetitive douching disrupts the normal organisms that reside in the vagina and can actually increase your risk of vaginal infection. Douching won't clear up a vaginal infection. Use a latex condom. Both female and female latex condoms may help you avoid infections spread by sexual contact. Wear cotton underwear. Also wear pantyhose with a cotton crotch. If you feel comfortable without it, skip wearing underwear to bed. Yeast thrives in [employer] Your symptoms should improve in the next day or two.  GET HELP RIGHT AWAY IF:  You have pain in your lower abdomen ( pelvic area or over your ovaries) You develop nausea or vomiting You develop a fever Your discharge changes or worsens You have persistent pain with intercourse You develop shortness of breath, a rapid pulse, or you faint.  These symptoms could be signs of problems or infections that need to be evaluated by a medical provider now.  MAKE SURE YOU   Understand these instructions. Will watch your condition. Will get help right  away if you are not doing well or get worse.  Thank you for choosing an e-visit.  Your e-visit answers were reviewed by a board certified advanced clinical practitioner to complete your personal care plan. Depending  upon the condition, your plan could have included both over the counter or prescription medications.  Please review your pharmacy choice. Make sure the pharmacy is open so you can pick up prescription now. If there is a problem, you may contact your provider through [employer] and have the prescription routed to another pharmacy.  Your safety is important to [place]. If you have drug allergies check your prescription carefully.   For the next 24 hours you can use MyChart to ask questions about today's visit, request a non-urgent call back, or ask for a work or school excuse. You will get an email in the next two days asking about your experience. I hope that your e-visit has been valuable and will speed your recovery.   I spent approximately 5 minutes reviewing the patient's history, current symptoms and coordinating their care today.

## 2023-02-23 ENCOUNTER — Telehealth: Payer: Medicaid Other | Admitting: Physician Assistant

## 2023-02-23 DIAGNOSIS — B9689 Other specified bacterial agents as the cause of diseases classified elsewhere: Secondary | ICD-10-CM

## 2023-02-23 MED ORDER — METRONIDAZOLE 500 MG PO TABS: 500.0000 mg | ORAL_TABLET | Freq: Three times a day (TID) | ORAL | 0 refills | Status: AC

## 2023-02-23 NOTE — Progress Notes (Signed)
E-Visit for Vaginal Symptoms  We are sorry that you are not feeling well. Here is how we plan to help! Based on what you shared with me it looks like you: May have a vaginosis due to bacteria  Vaginosis is an inflammation of the vagina that can result in discharge, itching and pain. The cause is usually a change in the normal balance of vaginal bacteria or an infection. Vaginosis can also result from reduced estrogen levels after menopause.  The most common causes of vaginosis are:   Bacterial vaginosis which results from an overgrowth of one on several organisms that are normally present in your vagina.   Yeast infections which are caused by a naturally occurring fungus called candida.   Vaginal atrophy (atrophic vaginosis) which results from the thinning of the vagina from reduced estrogen levels after menopause.   Trichomoniasis which is caused by a parasite and is commonly transmitted by sexual intercourse.  Factors that increase your risk of developing vaginosis include: Medications, such as antibiotics and steroids Uncontrolled diabetes Use of hygiene products such as bubble bath, vaginal spray or vaginal deodorant Douching Wearing damp or tight-fitting clothing Using an intrauterine device (IUD) for birth control Hormonal changes, such as those associated with pregnancy, birth control pills or menopause Sexual activity Having a sexually transmitted infection  Your treatment plan is Metronidazole or Flagyl 500mg twice a day for 7 days.  I have electronically sent this prescription into the pharmacy that you have chosen.  Be sure to take all of the medication as directed. Stop taking any medication if you develop a rash, tongue swelling or shortness of breath. Mothers who are breast feeding should consider pumping and discarding their breast milk while on these antibiotics. However, there is no consensus that infant exposure at these doses would be harmful.  Remember that  medication creams can weaken latex condoms. .   HOME CARE:  Good hygiene may prevent some types of vaginosis from recurring and may relieve some symptoms:  Avoid baths, hot tubs and whirlpool spas. Rinse soap from your outer genital area after a shower, and dry the area well to prevent irritation. Don't use scented or harsh soaps, such as those with deodorant or antibacterial action. Avoid irritants. These include scented tampons and pads. Wipe from front to back after using the toilet. Doing so avoids spreading fecal bacteria to your vagina.  Other things that may help prevent vaginosis include:  Don't douche. Your vagina doesn't require cleansing other than normal bathing. Repetitive douching disrupts the normal organisms that reside in the vagina and can actually increase your risk of vaginal infection. Douching won't clear up a vaginal infection. Use a latex condom. Both female and female latex condoms may help you avoid infections spread by sexual contact. Wear cotton underwear. Also wear pantyhose with a cotton crotch. If you feel comfortable without it, skip wearing underwear to bed. Yeast thrives in moist environments Your symptoms should improve in the next day or two.  GET HELP RIGHT AWAY IF:  You have pain in your lower abdomen ( pelvic area or over your ovaries) You develop nausea or vomiting You develop a fever Your discharge changes or worsens You have persistent pain with intercourse You develop shortness of breath, a rapid pulse, or you faint.  These symptoms could be signs of problems or infections that need to be evaluated by a medical provider now.  MAKE SURE YOU   Understand these instructions. Will watch your condition. Will get help right   away if you are not doing well or get worse.  Thank you for choosing an e-visit.  Your e-visit answers were reviewed by a board certified advanced clinical practitioner to complete your personal care plan. Depending upon the  condition, your plan could have included both over the counter or prescription medications.  Please review your pharmacy choice. Make sure the pharmacy is open so you can pick up prescription now. If there is a problem, you may contact your provider through MyChart messaging and have the prescription routed to another pharmacy.  Your safety is important to us. If you have drug allergies check your prescription carefully.   For the next 24 hours you can use MyChart to ask questions about today's visit, request a non-urgent call back, or ask for a work or school excuse. You will get an email in the next two days asking about your experience. I hope that your e-visit has been valuable and will speed your recovery.   I have spent 5 minutes in review of e-visit questionnaire, review and updating patient chart, medical decision making and response to patient.   Hopelynn Gartland Z Ward, PA-C    

## 2023-03-04 ENCOUNTER — Ambulatory Visit: Payer: Medicaid Other

## 2023-03-05 ENCOUNTER — Other Ambulatory Visit: Payer: Self-pay | Admitting: Family

## 2023-03-05 ENCOUNTER — Ambulatory Visit
Admission: EM | Admit: 2023-03-05 | Discharge: 2023-03-05 | Disposition: A | Discharge status: Home / Self Care | Payer: Medicaid Other | Attending: Urgent Care | Admitting: Urgent Care

## 2023-03-05 DIAGNOSIS — J45901 Unspecified asthma with (acute) exacerbation: Secondary | ICD-10-CM

## 2023-03-05 DIAGNOSIS — J069 Acute upper respiratory infection, unspecified: Secondary | ICD-10-CM | POA: Diagnosis not present

## 2023-03-05 MED ORDER — PREDNISONE 20 MG PO TABS: 60.0000 mg | ORAL_TABLET | Freq: Every day | ORAL | 0 refills | Status: AC

## 2023-03-05 NOTE — Discharge Instructions (Addendum)
Follow up here or with your primary care provider if your symptoms are worsening or not improving.     

## 2023-03-05 NOTE — ED Provider Notes (Signed)
Kathryn Nash    CSN: [id number] Arrival date & time: 03/05/23  1523      History   Chief Complaint Chief Complaint  Patient presents with   URI    HPI Kathryn Nash is a 27 y.o. female.    URI   Presents to urgent care with complaint of cold symptoms starting 3 days ago.  He reports walking this morning with hoarse voice.  She voices concern regarding history of asthma.  Reports having a "congestion" in her chest that she has been unable to clear.  Reports dry unproductive cough.  Feels "tightness" in her chest.  Using prescribed Symbicort inhaler for asthma.  Also using OTC medication for symptom relief.  Past Medical History:  Diagnosis Date   Asthma    Chlamydia     Patient Active Problem List   Diagnosis Date Noted   Acute right-sided low back pain with right-sided sciatica 06/18/2022   Leukemoid reaction 06/18/2022   OSA (obstructive sleep apnea) 05/14/2022   Morbid obesity ([place]) 226 lbs BMI=37.2 10/20/2019   Asthma exacerbation 12/08/2018    Past Surgical History:  Procedure Laterality Date   NO PAST SURGERIES      OB History     Gravida  2   Para      Term      Preterm      AB  1   Living  0      SAB  1   IAB      Ectopic      Multiple      Live Births               Home Medications    Prior to Admission medications   Medication Sig Start Date End Date Taking? Authorizing Provider  albuterol (VENTOLIN HFA) 108 (90 Base) MCG/ACT inhaler Inhale 2 puffs into the lungs 2 (two) times daily. in the morning and at bedtime. 02/03/23   Sharion Balloon, FNP  budesonide-formoterol (SYMBICORT) 80-4.5 MCG/ACT inhaler Inhale 2 puffs into the lungs in the morning and at bedtime. 02/03/23   Sharion Balloon, FNP  cetirizine (ZYRTEC) 10 MG tablet Take 1 tablet (10 mg total) by mouth daily. 10/07/22   Sharion Balloon, FNP  Norethindrone Acetate-Ethinyl Estradiol (JUNEL 1.5/30) 1.5-30 MG-MCG tablet Take 1 tablet by mouth daily. 06/20/22    Philip Aspen, CNM    Family History Family History  Problem Relation Age of Onset   Asthma Mother    Asthma Father    Asthma Maternal Grandmother     Social History Social History   Tobacco Use   Smoking status: Never   Smokeless tobacco: Never   Tobacco comments:    Never used e-cigarettes  Vaping Use   Vaping Use: Never used  Substance Use Topics   Alcohol use: Not Currently    Alcohol/week: 1.0 standard drink of alcohol    Types: 1 Shots of liquor per week    Comment: last use 07/2022 "occassionally"   Drug use: Not Currently    Types: Marijuana    Comment: last use 2021     Allergies   Egg-derived products, Iohexol, Montelukast sodium, Fish-derived products, Metronidazole, and Penicillins   Review of Systems Review of Systems   Physical Exam Triage Vital Signs ED Triage Vitals [03/05/23 1601]  Enc Vitals Group     BP      Pulse      Resp      Temp  Temp src      SpO2      Weight 230 lb (104.3 kg)     Height 5\' 6"  (1.676 m)     Head Circumference      Peak Flow      Pain Score 0     Pain Loc      Pain Edu?      Excl. in [place]?    No data found.  Updated Vital Signs Ht 5\' 6"  (1.676 m)   Wt 230 lb (104.3 kg)   LMP 02/27/2023   BMI 37.12 kg/m   Visual Acuity Right Eye Distance:   Left Eye Distance:   Bilateral Distance:    Right Eye Near:   Left Eye Near:    Bilateral Near:     Physical Exam Vitals reviewed.  Constitutional:      Appearance: Normal appearance. She is ill-appearing.  Cardiovascular:     Pulses: Normal pulses.     Heart sounds: Normal heart sounds.  Pulmonary:     Effort: Pulmonary effort is normal.     Breath sounds: Normal breath sounds. Decreased air movement present. No wheezing.  Skin:    General: Skin is warm and dry.  Neurological:     General: No focal deficit present.     Mental Status: She is alert and oriented to person, place, and time.  Psychiatric:        Mood and Affect: Mood normal.         Behavior: Behavior normal.      UC Treatments / Results  Labs (all labs ordered are listed, but only abnormal results are displayed) Labs Reviewed - No data to display  EKG   Radiology No results found.  Procedures Procedures (including critical care time)  Medications Ordered in UC Medications - No data to display  Initial Impression / Assessment and Plan / UC Course  I have reviewed the triage vital signs and the nursing notes.  Pertinent labs & imaging results that were available during my care of the patient were reviewed by me and considered in my medical decision making (see chart for details).   Patient is afebrile here without recent antipyretics. Satting well on room air. Overall is ill appearing, well hydrated, without respiratory distress. Pulmonary exam is remarkable for decreased breath sounds however, Lungs CTAB without wheezing, rhonchi, rales.  Patient's symptoms are consistent with an acute viral process as well as exacerbation of asthma.  Recommending supportive care and OTC medication for her URI symptoms.  Will prescribe a course of prednisone to treat her asthma.  Patient acknowledges understanding and agreement with this treatment plan.  Final Clinical Impressions(s) / UC Diagnoses   Final diagnoses:  None   Discharge Instructions   None    ED Prescriptions   None    PDMP not reviewed this encounter.   Rose Phi, [place] 03/05/23 [phone]

## 2023-03-05 NOTE — ED Triage Notes (Signed)
Patient to Urgent Care with complaints of cold symptoms that started on Saturday. Reports waking up this morning with hoarseness.  Reports concerns about her asthma. States she has congestion in her chest she has been unable to clear. Reports a dry cough and she is unable to produce any mucus.  Has been using her symbicort inhaler. Also using nyquil/ allergy medication.

## 2023-03-08 ENCOUNTER — Telehealth: Payer: Medicaid Other | Admitting: Family Medicine

## 2023-03-08 DIAGNOSIS — J45901 Unspecified asthma with (acute) exacerbation: Secondary | ICD-10-CM

## 2023-03-08 MED ORDER — BUDESONIDE-FORMOTEROL FUMARATE 80-4.5 MCG/ACT IN AERO: 2.0000 | INHALATION_SPRAY | Freq: Two times a day (BID) | RESPIRATORY_TRACT | 0 refills | Status: DC

## 2023-03-08 NOTE — Progress Notes (Signed)
E-Visit for Asthma  Based on what you have shared with me, it looks like you may have a flare up of your asthma.  Asthma is a chronic (ongoing) lung disease which results in airway obstruction, inflammation and hyper-responsiveness.   Asthma symptoms vary from person to person, with common symptoms including nighttime awakening and decreased ability to participate in normal activities as a result of shortness of breath. It is often triggered by changes in weather, changes in the season, changes in air temperature, or inside (home, school, daycare or work) allergens such as animal dander, mold, mildew, woodstoves or cockroaches.   It can also be triggered by hormonal changes, extreme emotion, physical exertion or an upper respiratory tract.     It is important to identify the trigger, and then eliminate or avoid the trigger if possible.   If you have been prescribed medications to be taken on a regular basis, it is important to follow the asthma action plan and to follow guidelines to adjust medication in response to increasing symptoms of decreased peak expiratory flow rate  Treatment: I have prescribed: symbicort  HOME CARE Only take medications as instructed by your medical team. Consider wearing a mask or scarf to improve breathing air temperature have been shown to decrease irritation and decrease exacerbations Get rest. Taking a steamy shower or using a humidifier may help nasal congestion sand ease sore throat pain. You can place a towel over your head and breathe in the steam from hot water coming from a faucet. Using a saline nasal spray works much the same way.  Cough drops, hare candies and sore throat lozenges may ease your cough.  Avoid close contacts especially the very you and the elderly Cover your mouth if you cough or sneeze Always remember to wash your hands.     GET HELP RIGHT AWAY IF: You develop worsening symptoms; breathlessness at rest, drowsy, confused or agitated, unable to speak in full sentences You have coughing fits You develop a severe headache or visual changes You develop shortness of breath, difficulty breathing or start having chest pain Your symptoms persist after you have completed your treatment plan If your symptoms do not improve within 10 days  MAKE SURE YOU Understand these instructions. Will watch your condition. Will get help right away if you are not doing well or get worse.   Your e-visit answers were reviewed by a board certified advanced clinical practitioner to complete your personal care plan, Depending upon the condition, your plan could have included both over the counter or prescription medications.   Please review your pharmacy choice. Your safety is important to [place]. If you have drug allergies check your prescription carefully.  You can use MyChart to ask questions about today's visit, request a non-urgent  call back, or ask for a work or school excuse for 24 hours related to this e-Visit. If it has been greater than 24 hours you will need to follow up with your provider, or enter a new e-Visit to address those concerns.   You will get an e-mail in the next two days asking about your experience. I hope that your e-visit has been valuable and will speed your recovery. Thank you for using e-visits.    have provided 5 minutes of non face to face time during this encounter for chart review and documentation.

## 2023-03-14 ENCOUNTER — Telehealth: Payer: Medicaid Other | Admitting: Physician Assistant

## 2023-03-14 DIAGNOSIS — B9689 Other specified bacterial agents as the cause of diseases classified elsewhere: Secondary | ICD-10-CM

## 2023-03-14 MED ORDER — AZITHROMYCIN 250 MG PO TABS: ORAL_TABLET | ORAL | 0 refills | Status: AC

## 2023-03-14 NOTE — Progress Notes (Signed)
I have spent 5 minutes in review of e-visit questionnaire, review and updating patient chart, medical decision making and response to patient.   Mathilde Mcwherter Cody Benjamin Casanas, PA-C    

## 2023-03-14 NOTE — Progress Notes (Signed)
E-Visit for Sore Throat - Strep Symptoms  We are sorry that you are not feeling well.  Here is how we plan to help!  Based on what you have shared with me it is likely that you have a possible bacterial pharyngitis.  I have prescribed Azithromycin 250 mg two tablets today and then one daily for 4 additional days. For throat pain, we recommend over the counter oral pain relief medications such as acetaminophen or aspirin, or anti-inflammatory medications such as ibuprofen or naproxen sodium. Topical treatments such as oral throat lozenges or sprays may be used as needed. Strep infections are not as easily transmitted as other respiratory infections, however we still recommend that you avoid close contact with loved ones, especially the very young and elderly.  Remember to wash your hands thoroughly throughout the day as this is the number one way to prevent the spread of infection and wipe down door knobs and counters with disinfectant.   Home Care: Only take medications as instructed by your medical team. Complete the entire course of an antibiotic. Do not take these medications with alcohol. A steam or ultrasonic humidifier can help congestion.  You can place a towel over your head and breathe in the steam from hot water coming from a faucet. Avoid close contacts especially the very young and the elderly. Cover your mouth when you cough or sneeze. Always remember to wash your hands.  Get Help Right Away If: You develop worsening fever or sinus pain. You develop a severe head ache or visual changes. Your symptoms persist after you have completed your treatment plan.  Make sure you Understand these instructions. Will watch your condition. Will get help right away if you are not doing well or get worse.   Thank you for choosing an e-visit.  Your e-visit answers were reviewed by a board certified advanced clinical practitioner to complete your personal care plan. Depending upon the  condition, your plan could have included both over the counter or prescription medications.  Please review your pharmacy choice. Make sure the pharmacy is open so you can pick up prescription now. If there is a problem, you may contact your provider through [employer] and have the prescription routed to another pharmacy.  Your safety is important to [place]. If you have drug allergies check your prescription carefully.   For the next 24 hours you can use MyChart to ask questions about today's visit, request a non-urgent call back, or ask for a work or school excuse. You will get an email in the next two days asking about your experience. I hope that your e-visit has been valuable and will speed your recovery.

## 2023-04-15 ENCOUNTER — Telehealth: Payer: Medicaid Other | Admitting: Physician Assistant

## 2023-04-15 ENCOUNTER — Telehealth: Payer: Medicaid Other | Admitting: Nurse Practitioner

## 2023-04-15 ENCOUNTER — Other Ambulatory Visit: Payer: Self-pay | Admitting: Family

## 2023-04-15 DIAGNOSIS — Z760 Encounter for issue of repeat prescription: Secondary | ICD-10-CM

## 2023-04-15 DIAGNOSIS — J4541 Moderate persistent asthma with (acute) exacerbation: Secondary | ICD-10-CM

## 2023-04-15 NOTE — Progress Notes (Signed)
Because we have provided recent refills , I feel your condition warrants further evaluation and I recommend that you be seen for a face to face visit.  Please contact your primary care physician practice to be seen. Many offices offer virtual options to be seen via video if you are not comfortable going in person to a medical facility at this time.  NOTE: You will NOT be charged for this eVisit.  If you do not have a PCP, [facility] offers a free physician referral service available at [phone]. Our trained staff has the experience, knowledge and resources to put you in touch with a physician who is right for you.    If you are having a true medical emergency please call 911.   Your e-visit answers were reviewed by a board certified advanced clinical practitioner to complete your personal care plan.  Thank you for using e-Visits.

## 2023-04-15 NOTE — Progress Notes (Signed)
Thank you for the details you included in the comment boxes. Those details are very helpful in determining the best course of treatment for you and help [place] to provide the best care.Because you require medication refills for chronic issues, we recommend that you convert this visit to a video visit in order for the provider to better assess what is going on.  The provider will be able to give you a more accurate diagnosis and treatment plan if we can more freely discuss your symptoms and with the addition of a virtual examination.   If you convert to a video visit, we will bill your insurance (similar to an office visit) and you will not be charged for this e-Visit. You will be able to stay at home and speak with the first available [facility] advanced practice provider. The link to do a video visit is in the drop down Menu tab of your Welcome screen in MyChart.  You will need to schedule the video visit. You can do this through one of two ways:  1) Go into your MyChart App and select the "Menu" button, then select the "Virtual Urgent Care Visit" then proceed scheduling -OR- 2) Go to [email or web address] and select "Get Started" under the Virtual Urgent Care option, select "View all options", then select the "Schedule on your Time" and proceed with scheduling.  Best Regards,  Daiva Nakayama, PA-C   I have spent 5 minutes in review of e-visit questionnaire, review and updating patient chart, medical decision making and response to patient.   Margaretann Loveless, PA-C

## 2023-04-18 ENCOUNTER — Ambulatory Visit
Admission: EM | Admit: 2023-04-18 | Discharge: 2023-04-18 | Disposition: A | Discharge status: Home / Self Care | Payer: Medicaid Other | Attending: Urgent Care | Admitting: Urgent Care

## 2023-04-18 DIAGNOSIS — Z113 Encounter for screening for infections with a predominantly sexual mode of transmission: Secondary | ICD-10-CM | POA: Insufficient documentation

## 2023-04-18 DIAGNOSIS — J45909 Unspecified asthma, uncomplicated: Secondary | ICD-10-CM

## 2023-04-18 DIAGNOSIS — N926 Irregular menstruation, unspecified: Secondary | ICD-10-CM | POA: Diagnosis present

## 2023-04-18 DIAGNOSIS — J45901 Unspecified asthma with (acute) exacerbation: Secondary | ICD-10-CM | POA: Diagnosis present

## 2023-04-18 LAB — POCT URINALYSIS DIP (MANUAL ENTRY)
Blood, UA: NEGATIVE
Glucose, UA: NEGATIVE mg/dL
Ketones, POC UA: NEGATIVE mg/dL
Leukocytes, UA: NEGATIVE
Nitrite, UA: NEGATIVE
Protein Ur, POC: 30 mg/dL — AB
Spec Grav, UA: >=1.03 — AB (ref 1.010–1.025)
Urobilinogen, UA: 0.2 E.U./dL
pH, UA: 6 (ref 5.0–8.0)

## 2023-04-18 LAB — POCT URINE PREGNANCY: Preg Test, Ur: NEGATIVE

## 2023-04-18 MED ORDER — BUDESONIDE-FORMOTEROL FUMARATE 80-4.5 MCG/ACT IN AERO: 2.0000 | INHALATION_SPRAY | Freq: Two times a day (BID) | RESPIRATORY_TRACT | 0 refills | Status: DC

## 2023-04-18 NOTE — ED Provider Notes (Signed)
[name]    CSN: [id number] Arrival date & time: 04/18/23  0801      History   Chief Complaint Chief Complaint  Patient presents with   Possible Pregnancy   SEXUALLY TRANSMITTED DISEASE    HPI Kathryn Nash is a 27 y.o. female.   HPI  This to urgent care to request pregnancy test, STD testing, UTI test.  She endorses lower back pain x 3 days as well as cloudy urine starting yesterday.  Significant PMH includes acute right-sided lower back pain with right sciatica  Past Medical History:  Diagnosis Date   Asthma    Chlamydia     Patient Active Problem List   Diagnosis Date Noted   Acute right-sided low back pain with right-sided sciatica 06/18/2022   Leukemoid reaction 06/18/2022   OSA (obstructive sleep apnea) 05/14/2022   Morbid obesity ([facility]) 226 lbs BMI=37.2 10/20/2019   Asthma exacerbation 12/08/2018    Past Surgical History:  Procedure Laterality Date   NO PAST SURGERIES      OB History     Gravida  2   Para      Term      Preterm      AB  1   Living  0      SAB  1   IAB      Ectopic      Multiple      Live Births               Home Medications    Prior to Admission medications   Medication Sig Start Date End Date Taking? Authorizing Provider  albuterol (VENTOLIN HFA) 108 (90 Base) MCG/ACT inhaler Inhale 2 puffs into the lungs 2 (two) times daily. in the morning and at bedtime. 02/03/23   Junie Spencer, FNP  budesonide-formoterol (SYMBICORT) 80-4.5 MCG/ACT inhaler Inhale 2 puffs into the lungs in the morning and at bedtime. 03/08/23   Delorse Lek, FNP  cetirizine (ZYRTEC) 10 MG tablet Take 1 tablet (10 mg total) by mouth daily. 10/07/22   Junie Spencer, FNP  Norethindrone Acetate-Ethinyl Estradiol (JUNEL 1.5/30) 1.5-30 MG-MCG tablet Take 1 tablet by mouth daily. 06/20/22   Doreene Burke, CNM    Family History Family History  Problem Relation Age of Onset   Asthma Mother    Asthma Father    Asthma Maternal  Grandmother     Social History Social History   Tobacco Use   Smoking status: Never   Smokeless tobacco: Never   Tobacco comments:    Never used e-cigarettes  Vaping Use   Vaping Use: Never used  Substance Use Topics   Alcohol use: Not Currently    Alcohol/week: 1.0 standard drink of alcohol    Types: 1 Shots of liquor per week    Comment: last use 07/2022 "occassionally"   Drug use: Not Currently    Types: Marijuana    Comment: last use 2021     Allergies   Egg-derived products, Iohexol, Montelukast, Montelukast sodium, Fish-derived products, Metronidazole, and Penicillins   Review of Systems Review of Systems   Physical Exam Triage Vital Signs ED Triage Vitals  Enc Vitals Group     BP 04/18/23 0847 106/77     Pulse Rate 04/18/23 0847 74     Resp 04/18/23 0847 16     Temp 04/18/23 0847 97.8 F (36.6 C)     Temp Source 04/18/23 0847 Temporal     SpO2 04/18/23 0847 96 %  Weight --      Height --      Head Circumference --      Peak Flow --      Pain Score 04/18/23 0850 0     Pain Loc --      Pain Edu? --      Excl. in [place]? --    No data found.  Updated Vital Signs BP 106/77 (BP Location: Left Arm)   Pulse 74   Temp 97.8 F (36.6 C) (Temporal)   Resp 16   LMP 03/12/2023 (Approximate)   SpO2 96%   Visual Acuity Right Eye Distance:   Left Eye Distance:   Bilateral Distance:    Right Eye Near:   Left Eye Near:    Bilateral Near:     Physical Exam   UC Treatments / Results  Labs (all labs ordered are listed, but only abnormal results are displayed) Labs Reviewed  POCT URINALYSIS DIP (MANUAL ENTRY)  POCT URINE PREGNANCY    EKG   Radiology No results found.  Procedures Procedures (including critical care time)  Medications Ordered in UC Medications - No data to display  Initial Impression / Assessment and Plan / UC Course  I have reviewed the triage vital signs and the nursing notes.  Pertinent labs & imaging results that were  available during my care of the patient were reviewed by me and considered in my medical decision making (see chart for details).   UA is not suggestive of urinary tract infection.  Negative leukocytes and nitrites.  Specific gravity is elevated and suggestive of dehydration.  Urine is cloudy and there is bilirubin and protein present.  Vaginal swab for STD check was obtained.  Results pending.  Urine pregnancy is negative.   Final Clinical Impressions(s) / UC Diagnoses   Final diagnoses:  None   Discharge Instructions   None    ED Prescriptions   None    PDMP not reviewed this encounter.   Charma Igo, [place] 04/18/23 [phone]

## 2023-04-18 NOTE — ED Triage Notes (Signed)
Patient presents to UC for pregnancy test, STD, and UTI check. States she has been having lower back pain x 3 days and cloudy urine since yesterday. Taking ibuprofen.

## 2023-04-18 NOTE — Discharge Instructions (Signed)
Results of today's testing will be available in your MyChart account in a few days.  Positive results requiring treatment will be communicated to you by telephone.

## 2023-04-22 ENCOUNTER — Telehealth: Payer: Self-pay

## 2023-04-22 LAB — CERVICOVAGINAL ANCILLARY ONLY
Bacterial Vaginitis (gardnerella): POSITIVE — AB
Candida Glabrata: NEGATIVE
Candida Vaginitis: NEGATIVE
Chlamydia: NEGATIVE
Comment: NEGATIVE
Comment: NEGATIVE
Comment: NEGATIVE
Comment: NEGATIVE
Comment: NEGATIVE
Comment: NORMAL
Neisseria Gonorrhea: NEGATIVE
Trichomonas: NEGATIVE

## 2023-04-22 MED ORDER — CLINDAMYCIN PHOSPHATE 2 % VA CREA: 1.0000 | TOPICAL_CREAM | Freq: Every day | VAGINAL | Status: DC

## 2023-04-22 MED ORDER — CLINDAMYCIN HCL 300 MG PO CAPS: 300.0000 mg | ORAL_CAPSULE | Freq: Two times a day (BID) | ORAL | 0 refills | Status: AC

## 2023-04-22 NOTE — Progress Notes (Signed)
Rx Clindamycin 300mg  po BID x 7 days sent to pharmacy on file per Jeannett Senior Immordino, APP.

## 2023-04-22 NOTE — Progress Notes (Signed)
TC to pt, ID verified. Adv of positive for BV, per Jeannett Senior Immordino, APP pt can start Cleomycin 2% vaginal cream per protocol. Pt states this is not covered by her insurance, message to provider for review, awaiting response.

## 2023-04-27 ENCOUNTER — Telehealth: Payer: Medicaid Other | Admitting: Family Medicine

## 2023-04-27 DIAGNOSIS — B3731 Acute candidiasis of vulva and vagina: Secondary | ICD-10-CM

## 2023-04-27 MED ORDER — FLUCONAZOLE 150 MG PO TABS: 150.0000 mg | ORAL_TABLET | Freq: Once | ORAL | 0 refills | Status: AC

## 2023-04-27 NOTE — Progress Notes (Signed)

## 2023-05-10 ENCOUNTER — Telehealth: Payer: Medicaid Other | Admitting: Nurse Practitioner

## 2023-05-10 DIAGNOSIS — M545 Low back pain, unspecified: Secondary | ICD-10-CM | POA: Diagnosis not present

## 2023-05-10 DIAGNOSIS — R399 Unspecified symptoms and signs involving the genitourinary system: Secondary | ICD-10-CM

## 2023-05-11 NOTE — Progress Notes (Signed)
Based on what you shared with me it looks like you have uti symptoms with back pain,that should be evaluated in a face to face office visit. Due to the associating back pain you will need a urinalysis and urine culture for proper treatment. NOTE: There will be NO CHARGE for this eVisit   If you are having a true medical emergency please call 911.      For an urgent face to face visit, [facility] has six urgent care centers for your convenience:     [facility] at [facility] Get Driving Directions [phone] [address] [place], [place] [address]    [facility] ([place]) Get Driving Directions [phone] [address] [place], [place] [address]  [facility] ([place]) Get Driving Directions [phone] [address] [place],  [place]  [address]  [facility] Get Driving Directions [phone] [address] [place], [place] [address]   [facility] [place] Get Driving Directions  [phone] [address] [place], [place] [address]   [facility] Get Driving Directions [phone] [address] [place], [place] [address]  Your MyChart E-visit questionnaire answers were reviewed by a board certified advanced clinical practitioner to complete your personal care plan based on your specific symptoms.  Thank you for using e-Visits.         

## 2023-05-12 ENCOUNTER — Other Ambulatory Visit: Payer: Self-pay | Admitting: Family

## 2023-05-20 ENCOUNTER — Telehealth: Payer: Medicaid Other | Admitting: Physician Assistant

## 2023-05-20 DIAGNOSIS — J4540 Moderate persistent asthma, uncomplicated: Secondary | ICD-10-CM

## 2023-05-21 MED ORDER — BUDESONIDE-FORMOTEROL FUMARATE 80-4.5 MCG/ACT IN AERO: 2.0000 | INHALATION_SPRAY | Freq: Two times a day (BID) | RESPIRATORY_TRACT | 0 refills | Status: DC

## 2023-05-21 MED ORDER — ALBUTEROL SULFATE HFA 108 (90 BASE) MCG/ACT IN AERS: 2.0000 | INHALATION_SPRAY | Freq: Two times a day (BID) | RESPIRATORY_TRACT | 0 refills | Status: DC

## 2023-05-21 NOTE — Progress Notes (Signed)
I have spent 5 minutes in review of e-visit questionnaire, review and updating patient chart, medical decision making and response to patient.   Maybell Misenheimer Cody Rydell Wiegel, PA-C    

## 2023-05-21 NOTE — Progress Notes (Signed)
E-Visit for Asthma  Based on what you have shared with me, it looks like you may have a flare up of your asthma.  Asthma is a chronic (ongoing) lung disease which results in airway obstruction, inflammation and hyper-responsiveness.   Asthma symptoms vary from person to person, with common symptoms including nighttime awakening and decreased ability to participate in normal activities as a result of shortness of breath. It is often triggered by changes in weather, changes in the season, changes in air temperature, or inside (home, school, daycare or work) allergens such as animal dander, mold, mildew, woodstoves or cockroaches.   It can also be triggered by hormonal changes, extreme emotion, physical exertion or an upper respiratory tract illness.     It is important to identify the trigger, and then eliminate or avoid the trigger if possible.   If you have been prescribed medications to be taken on a regular basis, it is important to follow the asthma action plan and to follow guidelines to adjust medication in response to increasing symptoms of decreased peak expiratory flow rate  Treatment: I have prescribed a one-time refill of your inhalers. You need to schedule a follow-up with your regular provider for further management and monitoring of your asthma.   HOME CARE Only take medications as instructed by your medical team. Consider wearing a mask or scarf to improve breathing air temperature have been shown to decrease irritation and decrease exacerbations Get rest. Taking a steamy shower or using a humidifier may help nasal congestion sand ease sore throat pain. You can place a towel over your head and breathe in the steam from hot water coming from a faucet. Using a saline nasal spray works much the same way.  Cough drops, hare candies and sore throat lozenges may ease your cough.   Avoid close contacts especially the very you and the elderly Cover your mouth if you cough or sneeze Always remember to wash your hands.    GET HELP RIGHT AWAY IF: You develop worsening symptoms; breathlessness at rest, drowsy, confused or agitated, unable to speak in full sentences You have coughing fits You develop a severe headache or visual changes You develop shortness of breath, difficulty breathing or start having chest pain Your symptoms persist after you have completed your treatment plan If your symptoms do not improve within 10 days  MAKE SURE YOU Understand these instructions. Will watch your condition. Will get help right away if you are not doing well or get worse.   Your e-visit answers were reviewed by a board certified advanced clinical practitioner to complete your personal care plan, Depending upon the condition, your plan could have included both over the counter or prescription medications.   Please review your pharmacy choice. Your safety is important to [place]. If you have drug allergies check your prescription carefully.  You can use MyChart to ask questions about today's visit, request a non-urgent  call back, or ask for a work or school excuse for 24 hours related to this e-Visit. If it has been greater than 24 hours you will need to follow up with your provider, or enter a new e-Visit to address those concerns.   You will get an e-mail in the next two days asking about your experience. I hope that your e-visit has been valuable and will speed your recovery. Thank you for using e-visits.

## 2023-05-21 NOTE — Addendum Note (Signed)
Addended by: Waldon Merl on: 05/21/2023 11:14 AM   Modules accepted: Orders

## 2023-06-25 ENCOUNTER — Telehealth: Payer: Medicaid Other | Admitting: Physician Assistant

## 2023-06-25 DIAGNOSIS — J4540 Moderate persistent asthma, uncomplicated: Secondary | ICD-10-CM

## 2023-06-25 NOTE — Progress Notes (Signed)
Because you are needing another refill of your inhaler and we gave a one-time refill of your chronic inhalers in the past month, I feel your condition warrants further evaluation and I recommend that you be seen in a face to face visit, or to follow-up with your PCP for further refills.    NOTE: There will be NO CHARGE for this eVisit   If you are having a true medical emergency please call 911.      For an urgent face to face visit, [facility] has eight urgent care centers for your convenience:   NEW!! [facility] at [facility] Get Driving Directions [phone] [address], [address] [place], [address]    [facility] at [facility] Get Driving Directions [phone] [address] [place], [place] [address]   [facility]) Get Driving Directions [phone] [address] [place], [place] [address]  [facility] [place]) Get Driving Directions [phone] [address] [place],  [place]  [address]  [facility] - at [employer]  [phone] [id number] [place],  [place] [address]   [facility] at [facility] Get Driving Directions [phone] [address] [place], [place] [address]   [facility] at [facility] Get Driving Directions  [phone] [address] [place], [place] [address]   [facility] at [place] Directions [phone] [address] [place], [place] [address]  Your MyChart E-visit questionnaire answers were reviewed by a board certified advanced clinical practitioner to complete your personal care plan based on your specific symptoms.  Thank you for using e-Visits.

## 2023-06-26 ENCOUNTER — Encounter: Payer: Self-pay | Admitting: Emergency Medicine

## 2023-06-26 ENCOUNTER — Ambulatory Visit
Admission: EM | Admit: 2023-06-26 | Discharge: 2023-06-26 | Disposition: A | Discharge status: Home / Self Care | Payer: Medicaid Other | Attending: Emergency Medicine | Admitting: Emergency Medicine

## 2023-06-26 DIAGNOSIS — N898 Other specified noninflammatory disorders of vagina: Secondary | ICD-10-CM | POA: Insufficient documentation

## 2023-06-26 LAB — POCT URINALYSIS DIP (MANUAL ENTRY)
Bilirubin, UA: NEGATIVE
Blood, UA: NEGATIVE
Glucose, UA: NEGATIVE mg/dL
Ketones, POC UA: NEGATIVE mg/dL
Nitrite, UA: NEGATIVE
Protein Ur, POC: NEGATIVE mg/dL
Spec Grav, UA: >=1.03 — AB (ref 1.010–1.025)
Urobilinogen, UA: 0.2 E.U./dL
pH, UA: 6 (ref 5.0–8.0)

## 2023-06-26 LAB — POCT URINE PREGNANCY: Preg Test, Ur: NEGATIVE

## 2023-06-26 MED ORDER — FLUCONAZOLE 150 MG PO TABS: 150.0000 mg | ORAL_TABLET | Freq: Every day | ORAL | 0 refills | Status: AC

## 2023-06-26 NOTE — ED Provider Notes (Signed)
Kathryn Nash    CSN: [id number] Arrival date & time: 06/26/23  5284      History   Chief Complaint Chief Complaint  Patient presents with   Vaginal Itching    HPI Kathryn Nash is a 27 y.o. female.   Patient presents for evaluation of a Otha Rickles thick vaginal discharge, vaginal itching and irritation present for 4 to 5 days.  Associated intermittent lower abdominal discomfort and mild dysuria.  Has not attempted treatment.  Sexually active, no condom use, endorses monogamy, no known exposure.  Unsure of vaginal odor due to chronic nasal congestion.  Denies urinary frequency, urgency, hematuria, flank pain, new rash or lesion.  Endorses irregularity of menstruation since miscarriage in 2023, last known menstruation in February 2024, denies use of birth control.  Past Medical History:  Diagnosis Date   Asthma    Chlamydia     Patient Active Problem List   Diagnosis Date Noted   Acute right-sided low back pain with right-sided sciatica 06/18/2022   Leukemoid reaction 06/18/2022   OSA (obstructive sleep apnea) 05/14/2022   Morbid obesity (HCC) 226 lbs BMI=37.2 10/20/2019   Asthma exacerbation 12/08/2018    Past Surgical History:  Procedure Laterality Date   NO PAST SURGERIES      OB History     Gravida  2   Para      Term      Preterm      AB  1   Living  0      SAB  1   IAB      Ectopic      Multiple      Live Births               Home Medications    Prior to Admission medications   Medication Sig Start Date End Date Taking? Authorizing Provider  budesonide-formoterol (SYMBICORT) 80-4.5 MCG/ACT inhaler Inhale 2 puffs into the lungs in the morning and at bedtime. 05/21/23  Yes Waldon Merl, PA-C  fluconazole (DIFLUCAN) 150 MG tablet Take 1 tablet (150 mg total) by mouth daily for 2 doses. 06/26/23 06/28/23 Yes Meshelle Holness R, NP  albuterol (VENTOLIN HFA) 108 (90 Base) MCG/ACT inhaler Inhale 2 puffs into the lungs 2 (two) times  daily. in the morning and at bedtime. 05/21/23   Waldon Merl, PA-C  cetirizine (ZYRTEC) 10 MG tablet Take 1 tablet (10 mg total) by mouth daily. 10/07/22   Junie Spencer, FNP  Norethindrone Acetate-Ethinyl Estradiol (JUNEL 1.5/30) 1.5-30 MG-MCG tablet Take 1 tablet by mouth daily. 06/20/22   Doreene Burke, CNM    Family History Family History  Problem Relation Age of Onset   Asthma Mother    Asthma Father    Asthma Maternal Grandmother     Social History Social History   Tobacco Use   Smoking status: Never   Smokeless tobacco: Never   Tobacco comments:    Never used e-cigarettes  Vaping Use   Vaping status: Never Used  Substance Use Topics   Alcohol use: Not Currently    Alcohol/week: 1.0 standard drink of alcohol    Types: 1 Shots of liquor per week    Comment: last use 07/2022 "occassionally"   Drug use: Not Currently    Types: Marijuana    Comment: last use 2021     Allergies   Egg-derived products, Iohexol, Montelukast, Montelukast sodium, Fish-derived products, Metronidazole, and Penicillins   Review of Systems Review of Systems   Physical  Exam Triage Vital Signs ED Triage Vitals  Encounter Vitals Group     BP 06/26/23 0831 124/78     Systolic BP Percentile --      Diastolic BP Percentile --      Pulse Rate 06/26/23 0831 78     Resp 06/26/23 0831 16     Temp 06/26/23 0831 98.7 F (37.1 C)     Temp Source 06/26/23 0831 Oral     SpO2 06/26/23 0831 95 %     Weight --      Height --      Head Circumference --      Peak Flow --      Pain Score 06/26/23 0842 4     Pain Loc --      Pain Education --      Exclude from Growth Chart --    No data found.  Updated Vital Signs BP 124/78 (BP Location: Left Arm)   Pulse 78   Temp 98.7 F (37.1 C) (Oral)   Resp 16   SpO2 95%   Visual Acuity Right Eye Distance:   Left Eye Distance:   Bilateral Distance:    Right Eye Near:   Left Eye Near:    Bilateral Near:     Physical  Exam Constitutional:      Appearance: Normal appearance.  Eyes:     Extraocular Movements: Extraocular movements intact.  Pulmonary:     Effort: Pulmonary effort is normal.  Abdominal:     General: Abdomen is flat. Bowel sounds are normal.     Palpations: Abdomen is soft.  Genitourinary:    Comments: deferred Neurological:     Mental Status: She is alert and oriented to person, place, and time. Mental status is at baseline.      UC Treatments / Results  Labs (all labs ordered are listed, but only abnormal results are displayed) Labs Reviewed  POCT URINALYSIS DIP (MANUAL ENTRY) - Abnormal; Notable for the following components:      Result Value   Clarity, UA cloudy (*)    Spec Grav, UA >=1.030 (*)    Leukocytes, UA Small (1+) (*)    All other components within normal limits  POCT URINE PREGNANCY  CERVICOVAGINAL ANCILLARY ONLY    EKG   Radiology No results found.  Procedures Procedures (including critical care time)  Medications Ordered in UC Medications - No data to display  Initial Impression / Assessment and Plan / UC Course  I have reviewed the triage vital signs and the nursing notes.  Pertinent labs & imaging results that were available during my care of the patient were reviewed by me and considered in my medical decision making (see chart for details).  Vaginal discharge, vaginal itching  Urinalysis negative, urine pregnancy negative, prophylactically treating for yeast, Diflucan sent to pharmacy and discussed administration, STI labs are pending, will treat further per protocol, advised abstinence until lab results, treatment is complete and all symptoms have resolved, advised condom use during encounters moving forward, given written handout of additional supportive measures, may follow-up with urgent care as needed Final Clinical Impressions(s) / UC Diagnoses   Final diagnoses:  Vaginal itching  Vaginal discharge     Discharge Instructions       Today you are being treated prophylactically for yeast.   Urine pregnancy test is negative  Urinalysis is negative for bladder infection  Take diflucan 150 mg once, if symptoms still present in 3 days then you may take second  pill   Yeast infections which are caused by a naturally occurring fungus called candida. Vaginosis is an inflammation of the vagina that can result in discharge, itching and pain. The cause is usually a change in the normal balance of vaginal bacteria or an infection. Vaginosis can also result from reduced estrogen levels after menopause.  Labs pending , you will be contacted if positive for any sti and treatment will be sent to the pharmacy, you will have to return to the clinic if positive for gonorrhea to receive treatment   Please refrain from having sex until labs results, if positive please refrain from having sex until treatment complete and symptoms resolve   If positive for  Chlamydia  gonorrhea or trichomoniasis please notify partner or partners so they may tested as well  Moving forward, it is recommended you use some form of protection against the transmission of sti infections  such as condoms or dental dams with each sexual encounter    In addition:   Avoid baths, hot tubs and whirlpool spas.  Don't use scented or harsh soaps, such as those with deodorant or antibacterial action. Avoid irritants. These include scented tampons and pads. Wipe from front to back after using the toilet.  Don't douche. Your vagina doesn't require cleansing other than normal bathing.  Use a  condom. Wear cotton underwear, this fabric helps absorb moisture     ED Prescriptions     Medication Sig Dispense Auth. Provider   fluconazole (DIFLUCAN) 150 MG tablet Take 1 tablet (150 mg total) by mouth daily for 2 doses. 2 tablet Valinda Hoar, NP      PDMP not reviewed this encounter.   Valinda Hoar, [place] 06/26/23 [phone]

## 2023-06-26 NOTE — ED Triage Notes (Signed)
Reports vaginal irritation and white drainage over the last week. Does report pain with urination and lower abdominal pain. Denies use of birth control. Not on birth control, LMP she states was "months ago", but that's normal for her. Denies fever. Has not tried anything to treat symptoms at home. States she does not want any blood work performed at today's visit

## 2023-06-26 NOTE — Discharge Instructions (Signed)
Today you are being treated prophylactically for yeast.   Urine pregnancy test is negative  Urinalysis is negative for bladder infection  Take diflucan 150 mg once, if symptoms still present in 3 days then you may take second pill   Yeast infections which are caused by a naturally occurring fungus called candida. Vaginosis is an inflammation of the vagina that can result in discharge, itching and pain. The cause is usually a change in the normal balance of vaginal bacteria or an infection. Vaginosis can also result from reduced estrogen levels after menopause.  Labs pending , you will be contacted if positive for any sti and treatment will be sent to the pharmacy, you will have to return to the clinic if positive for gonorrhea to receive treatment   Please refrain from having sex until labs results, if positive please refrain from having sex until treatment complete and symptoms resolve   If positive for  Chlamydia  gonorrhea or trichomoniasis please notify partner or partners so they may tested as well  Moving forward, it is recommended you use some form of protection against the transmission of sti infections  such as condoms or dental dams with each sexual encounter    In addition:   Avoid baths, hot tubs and whirlpool spas.  Don't use scented or harsh soaps, such as those with deodorant or antibacterial action. Avoid irritants. These include scented tampons and pads. Wipe from front to back after using the toilet.  Don't douche. Your vagina doesn't require cleansing other than normal bathing.  Use a  condom. Wear cotton underwear, this fabric helps absorb moisture

## 2023-06-27 ENCOUNTER — Telehealth: Payer: Self-pay | Admitting: Emergency Medicine

## 2023-06-27 LAB — CERVICOVAGINAL ANCILLARY ONLY
Bacterial Vaginitis (gardnerella): POSITIVE — AB
Candida Glabrata: NEGATIVE
Candida Vaginitis: POSITIVE — AB
Chlamydia: NEGATIVE
Comment: NEGATIVE
Comment: NEGATIVE
Comment: NEGATIVE
Comment: NORMAL
Neisseria Gonorrhea: NEGATIVE

## 2023-06-27 MED ORDER — CLINDAMYCIN HCL 150 MG PO CAPS: 300.0000 mg | ORAL_CAPSULE | Freq: Two times a day (BID) | ORAL | 0 refills | Status: AC

## 2023-08-08 ENCOUNTER — Telehealth: Payer: Medicaid Other | Admitting: Physician Assistant

## 2023-08-08 DIAGNOSIS — B9689 Other specified bacterial agents as the cause of diseases classified elsewhere: Secondary | ICD-10-CM | POA: Diagnosis not present

## 2023-08-08 DIAGNOSIS — J028 Acute pharyngitis due to other specified organisms: Secondary | ICD-10-CM

## 2023-08-08 MED ORDER — AZITHROMYCIN 250 MG PO TABS: ORAL_TABLET | ORAL | 0 refills | Status: AC

## 2023-08-08 NOTE — Progress Notes (Signed)
E-Visit for Sore Throat - Strep Symptoms  We are sorry that you are not feeling well.  Here is how we plan to help!  Based on what you have shared with me it is likely that you have a bacterial pharyngitis. This is inflammation and infection in the back of the throat.  This is an infection cause by bacteria and is treated with antibiotics.  I have prescribed Azithromycin 250 mg two tablets today and then one daily for 4 additional days. For throat pain, we recommend over the counter oral pain relief medications such as acetaminophen or aspirin, or anti-inflammatory medications such as ibuprofen or naproxen sodium. Topical treatments such as oral throat lozenges or sprays may be used as needed. Strep infections are not as easily transmitted as other respiratory infections, however we still recommend that you avoid close contact with loved ones, especially the very young and elderly.  Remember to wash your hands thoroughly throughout the day as this is the number one way to prevent the spread of infection and wipe down door knobs and counters with disinfectant.   Home Care: Only take medications as instructed by your medical team. Complete the entire course of an antibiotic. Do not take these medications with alcohol. A steam or ultrasonic humidifier can help congestion.  You can place a towel over your head and breathe in the steam from hot water coming from a faucet. Avoid close contacts especially the very young and the elderly. Cover your mouth when you cough or sneeze. Always remember to wash your hands.  Get Help Right Away If: You develop worsening fever or sinus pain. You develop a severe head ache or visual changes. Your symptoms persist after you have completed your treatment plan.  Make sure you Understand these instructions. Will watch your condition. Will get help right away if you are not doing well or get worse.   Thank you for choosing an e-visit.  Your e-visit answers were  reviewed by a board certified advanced clinical practitioner to complete your personal care plan. Depending upon the condition, your plan could have included both over the counter or prescription medications.  Please review your pharmacy choice. Make sure the pharmacy is open so you can pick up prescription now. If there is a problem, you may contact your provider through [employer] and have the prescription routed to another pharmacy.  Your safety is important to [place]. If you have drug allergies check your prescription carefully.   For the next 24 hours you can use MyChart to ask questions about today's visit, request a non-urgent call back, or ask for a work or school excuse. You will get an email in the next two days asking about your experience. I hope that your e-visit has been valuable and will speed your recovery.

## 2023-08-08 NOTE — Progress Notes (Signed)
I have spent 5 minutes in review of e-visit questionnaire, review and updating patient chart, medical decision making and response to patient.   William Cody Martin, PA-C    

## 2023-08-12 ENCOUNTER — Telehealth: Payer: Medicaid Other | Admitting: Physician Assistant

## 2023-08-12 DIAGNOSIS — B9689 Other specified bacterial agents as the cause of diseases classified elsewhere: Secondary | ICD-10-CM

## 2023-08-12 MED ORDER — METRONIDAZOLE 500 MG PO TABS: 500.0000 mg | ORAL_TABLET | Freq: Two times a day (BID) | ORAL | 0 refills | Status: AC

## 2023-08-12 NOTE — Progress Notes (Signed)
E-Visit for Vaginal Symptoms  We are sorry that you are not feeling well. Here is how we plan to help! Based on what you shared with me it looks like you: May have a vaginosis due to bacteria  Vaginosis is an inflammation of the vagina that can result in discharge, itching and pain. The cause is usually a change in the normal balance of vaginal bacteria or an infection. Vaginosis can also result from reduced estrogen levels after menopause.  The most common causes of vaginosis are:   Bacterial vaginosis which results from an overgrowth of one on several organisms that are normally present in your vagina.   Yeast infections which are caused by a naturally occurring fungus called candida.   Vaginal atrophy (atrophic vaginosis) which results from the thinning of the vagina from reduced estrogen levels after menopause.   Trichomoniasis which is caused by a parasite and is commonly transmitted by sexual intercourse.  Factors that increase your risk of developing vaginosis include: Medications, such as antibiotics and steroids Uncontrolled diabetes Use of hygiene products such as bubble bath, vaginal spray or vaginal deodorant Douching Wearing damp or tight-fitting clothing Using an intrauterine device (IUD) for birth control Hormonal changes, such as those associated with pregnancy, birth control pills or menopause Sexual activity Having a sexually transmitted infection  Your treatment plan is Metronidazole or Flagyl 500mg twice a day for 7 days.  I have electronically sent this prescription into the pharmacy that you have chosen.  Be sure to take all of the medication as directed. Stop taking any medication if you develop a rash, tongue swelling or shortness of breath. Mothers who are breast feeding should consider pumping and discarding their breast milk while on these antibiotics. However, there is no consensus that infant exposure at these doses would be harmful.  Remember that  medication creams can weaken latex condoms. .   HOME CARE:  Good hygiene may prevent some types of vaginosis from recurring and may relieve some symptoms:  Avoid baths, hot tubs and whirlpool spas. Rinse soap from your outer genital area after a shower, and dry the area well to prevent irritation. Don't use scented or harsh soaps, such as those with deodorant or antibacterial action. Avoid irritants. These include scented tampons and pads. Wipe from front to back after using the toilet. Doing so avoids spreading fecal bacteria to your vagina.  Other things that may help prevent vaginosis include:  Don't douche. Your vagina doesn't require cleansing other than normal bathing. Repetitive douching disrupts the normal organisms that reside in the vagina and can actually increase your risk of vaginal infection. Douching won't clear up a vaginal infection. Use a latex condom. Both female and female latex condoms may help you avoid infections spread by sexual contact. Wear cotton underwear. Also wear pantyhose with a cotton crotch. If you feel comfortable without it, skip wearing underwear to bed. Yeast thrives in moist environments Your symptoms should improve in the next day or two.  GET HELP RIGHT AWAY IF:  You have pain in your lower abdomen ( pelvic area or over your ovaries) You develop nausea or vomiting You develop a fever Your discharge changes or worsens You have persistent pain with intercourse You develop shortness of breath, a rapid pulse, or you faint.  These symptoms could be signs of problems or infections that need to be evaluated by a medical provider now.  MAKE SURE YOU   Understand these instructions. Will watch your condition. Will get help right   away if you are not doing well or get worse.  Thank you for choosing an e-visit.  Your e-visit answers were reviewed by a board certified advanced clinical practitioner to complete your personal care plan. Depending upon the  condition, your plan could have included both over the counter or prescription medications.  Please review your pharmacy choice. Make sure the pharmacy is open so you can pick up prescription now. If there is a problem, you may contact your provider through MyChart messaging and have the prescription routed to another pharmacy.  Your safety is important to us. If you have drug allergies check your prescription carefully.   For the next 24 hours you can use MyChart to ask questions about today's visit, request a non-urgent call back, or ask for a work or school excuse. You will get an email in the next two days asking about your experience. I hope that your e-visit has been valuable and will speed your recovery.  I have spent 5 minutes in review of e-visit questionnaire, review and updating patient chart, medical decision making and response to patient.   Jennifer M Burnette, PA-C  

## 2023-08-13 ENCOUNTER — Other Ambulatory Visit: Payer: Self-pay | Admitting: Family

## 2023-08-13 ENCOUNTER — Ambulatory Visit
Admission: EM | Admit: 2023-08-13 | Discharge: 2023-08-13 | Disposition: A | Discharge status: Home / Self Care | Payer: Medicaid Other | Attending: Emergency Medicine | Admitting: Emergency Medicine

## 2023-08-13 DIAGNOSIS — N898 Other specified noninflammatory disorders of vagina: Secondary | ICD-10-CM

## 2023-08-13 DIAGNOSIS — R102 Pelvic and perineal pain: Secondary | ICD-10-CM | POA: Diagnosis present

## 2023-08-13 DIAGNOSIS — M545 Low back pain, unspecified: Secondary | ICD-10-CM | POA: Diagnosis present

## 2023-08-13 LAB — POCT URINALYSIS DIP (MANUAL ENTRY)
Blood, UA: NEGATIVE
Glucose, UA: NEGATIVE mg/dL
Ketones, POC UA: NEGATIVE mg/dL
Leukocytes, UA: NEGATIVE
Nitrite, UA: NEGATIVE
Protein Ur, POC: 30 mg/dL — AB
Spec Grav, UA: >=1.03 — AB (ref 1.010–1.025)
Urobilinogen, UA: 0.2 U/dL
pH, UA: 6 (ref 5.0–8.0)

## 2023-08-13 LAB — POCT URINE PREGNANCY: Preg Test, Ur: NEGATIVE

## 2023-08-13 MED ORDER — BUDESONIDE-FORMOTEROL FUMARATE 80-4.5 MCG/ACT IN AERO: 2.0000 | INHALATION_SPRAY | Freq: Two times a day (BID) | RESPIRATORY_TRACT | 0 refills | Status: DC

## 2023-08-13 MED ORDER — CYCLOBENZAPRINE HCL 5 MG PO TABS: 5.0000 mg | ORAL_TABLET | Freq: Three times a day (TID) | ORAL | 0 refills | Status: DC | PRN

## 2023-08-13 MED ORDER — ALBUTEROL SULFATE HFA 108 (90 BASE) MCG/ACT IN AERS: 2.0000 | INHALATION_SPRAY | Freq: Two times a day (BID) | RESPIRATORY_TRACT | 0 refills | Status: DC

## 2023-08-13 NOTE — ED Triage Notes (Signed)
Patient to Urgent Care with complaints of intermittent/ generalized abdominal and flank pain. Reports symptoms started yesterday morning. Describes pain as pressure/ sharp.  Denies any discharge or urinary symptoms. Reports some vaginal discomfort.   Patient was prescribed metronidazole via e-visit yesterday but has not started this yet. Taking azithromycin.

## 2023-08-13 NOTE — Discharge Instructions (Addendum)
Urine pregnancy is negative  Urinalysis is negative for bladder infection  You may prophylactically begin use of medication for bacterial vaginosis if you would like for you may hold off until testing results  Labs pending, you will be contacted if positive for any sti and treatment will be sent to the pharmacy, you will have to return to the clinic if positive for gonorrhea to receive treatment   Please refrain from having sex until labs results, if positive please refrain from having sex until treatment complete and symptoms resolve   If positive for HIV, Syphilis, Chlamydia  gonorrhea or trichomoniasis please notify partner or partners so they may tested as well  Moving forward, it is recommended you use some form of protection against the transmission of sti infections  such as condoms or dental dams with each sexual encounter    Your pain is most likely caused by irritation to the muscles .  Take ibuprofen 600 to 800 mg every 6-8 hours for management of back pain, may use muscle relaxant every 8 hours as needed for additional comfort, be mindful this may make you feel sleepy  You may use heating pad in 15 minute intervals as needed for additional comfort, within the first 2-3 days you may find comfort in using ice in 10-15 minutes over affected area  Begin stretching affected area daily for 10 minutes as tolerated to further loosen muscles   When lying down place pillow underneath and between knees for support  Can try sleeping without pillow on firm mattress   Practice good posture: head back, shoulders back, chest forward, pelvis back and weight distributed evenly on both legs  If pain continues to persist or worsen you may follow-up with your primary doctor for reevaluation

## 2023-08-13 NOTE — ED Provider Notes (Signed)
Kathryn Nash    CSN: [id number] Arrival date & time: 08/13/23  1034      History   Chief Complaint Chief Complaint  Patient presents with   Abdominal Pain   Back Pain    HPI Kathryn Nash is a 27 y.o. female.   Patient presents for evaluation of intermittent lower abdominal pain, right flank pain and bilateral lower back pain beginning 1 day ago.  Experiencing intermittent vaginal irritation and a mild odor beginning 1 day ago.  Pain described as sharp and pressure, rating a 6 out of 10.  Back pain has occurred before but never to this severity, has been constant, does not radiate.  Has not attempted treatment of symptoms.  Sexually active but no known exposures.  History of reoccurring bacterial vaginosis, completed e-visit 1 day ago prescribed metronidazole but has not started medication send for treatment of bacterial pharyngitis, prescribed via e-visit.  Denies urinary symptoms vaginal discharge or vaginal itching.  Last menstrual period February 2024 ,pregnancy test completed in June 2024 negative, no birth control.   Past Medical History:  Diagnosis Date   Asthma    Chlamydia     Patient Active Problem List   Diagnosis Date Noted   Acute right-sided low back pain with right-sided sciatica 06/18/2022   Leukemoid reaction 06/18/2022   OSA (obstructive sleep apnea) 05/14/2022   Morbid obesity (HCC) 226 lbs BMI=37.2 10/20/2019   Asthma exacerbation 12/08/2018    Past Surgical History:  Procedure Laterality Date   NO PAST SURGERIES      OB History     Gravida  2   Para      Term      Preterm      AB  1   Living  0      SAB  1   IAB      Ectopic      Multiple      Live Births               Home Medications    Prior to Admission medications   Medication Sig Start Date End Date Taking? Authorizing Provider  cyclobenzaprine (FLEXERIL) 5 MG tablet Take 1 tablet (5 mg total) by mouth 3 (three) times daily as needed for muscle spasms.  08/13/23  Yes Shaiden Aldous R, NP  albuterol (VENTOLIN HFA) 108 (90 Base) MCG/ACT inhaler Inhale 2 puffs into the lungs 2 (two) times daily. in the morning and at bedtime. 08/13/23   Valinda Hoar, NP  azithromycin (ZITHROMAX) 250 MG tablet Take 2 tablets on day 1, then 1 tablet daily on days 2 through 5 08/08/23 08/13/23  Waldon Merl, PA-C  budesonide-formoterol [facility]) 80-4.5 MCG/ACT inhaler Inhale 2 puffs into the lungs in the morning and at bedtime. 08/13/23   Micco Bourbeau, Elita Boone, NP  cetirizine (ZYRTEC) 10 MG tablet Take 1 tablet (10 mg total) by mouth daily. 10/07/22   Junie Spencer, FNP  metroNIDAZOLE (FLAGYL) 500 MG tablet Take 1 tablet (500 mg total) by mouth 2 (two) times daily for 7 days. 08/12/23 08/19/23  Margaretann Loveless, PA-C  Norethindrone Acetate-Ethinyl Estradiol (JUNEL 1.5/30) 1.5-30 MG-MCG tablet Take 1 tablet by mouth daily. 06/20/22   Doreene Burke, CNM    Family History Family History  Problem Relation Age of Onset   Asthma Mother    Asthma Father    Asthma Maternal Grandmother     Social History Social History   Tobacco Use   Smoking status:  Never   Smokeless tobacco: Never   Tobacco comments:    Never used e-cigarettes  Vaping Use   Vaping status: Never Used  Substance Use Topics   Alcohol use: Not Currently    Alcohol/week: 1.0 standard drink of alcohol    Types: 1 Shots of liquor per week    Comment: last use 07/2022 "occassionally"   Drug use: Not Currently    Types: Marijuana    Comment: last use 2021     Allergies   Egg-derived products, Iohexol, Montelukast, Montelukast sodium, Fish-derived products, Metronidazole, and Penicillins   Review of Systems Review of Systems  Gastrointestinal:  Positive for abdominal pain.  Musculoskeletal:  Positive for back pain.     Physical Exam Triage Vital Signs ED Triage Vitals  Encounter Vitals Group     BP 08/13/23 1115 121/78     Systolic BP Percentile --      Diastolic BP Percentile  --      Pulse Rate 08/13/23 1115 73     Resp 08/13/23 1115 17     Temp 08/13/23 1115 97.7 F (36.5 C)     Temp src --      SpO2 08/13/23 1115 96 %     Weight --      Height --      Head Circumference --      Peak Flow --      Pain Score 08/13/23 1112 6     Pain Loc --      Pain Education --      Exclude from Growth Chart --    No data found.  Updated Vital Signs BP 121/78   Pulse 73   Temp 97.7 F (36.5 C)   Resp 17   LMP 07/30/2023   SpO2 96%   Visual Acuity Right Eye Distance:   Left Eye Distance:   Bilateral Distance:    Right Eye Near:   Left Eye Near:    Bilateral Near:     Physical Exam Constitutional:      Appearance: Normal appearance.  Eyes:     Extraocular Movements: Extraocular movements intact.  Pulmonary:     Effort: Pulmonary effort is normal.  Abdominal:     General: Abdomen is flat. Bowel sounds are normal.     Palpations: Abdomen is soft.     Tenderness: There is abdominal tenderness in the suprapubic area. There is no right CVA tenderness, left CVA tenderness or guarding.  Genitourinary:    Comments: deferred Musculoskeletal:     Comments: Generalized tenderness to the lumbar region without ecchymosis swelling or deformity able to sit erect without complication, able to twist turn and bend but pain is elicited with movement  Neurological:     Mental Status: She is alert and oriented to person, place, and time. Mental status is at baseline.      UC Treatments / Results  Labs (all labs ordered are listed, but only abnormal results are displayed) Labs Reviewed  POCT URINALYSIS DIP (MANUAL ENTRY) - Abnormal; Notable for the following components:      Result Value   Color, UA orange (*)    Clarity, UA cloudy (*)    Bilirubin, UA small (*)    Spec Grav, UA >=1.030 (*)    Protein Ur, POC =30 (*)    All other components within normal limits  RPR  HIV ANTIBODY (ROUTINE TESTING W REFLEX)  POCT URINE PREGNANCY  CERVICOVAGINAL ANCILLARY  ONLY    EKG  Radiology No results found.  Procedures Procedures (including critical care time)  Medications Ordered in UC Medications - No data to display  Initial Impression / Assessment and Plan / UC Course  I have reviewed the triage vital signs and the nursing notes.  Pertinent labs & imaging results that were available during my care of the patient were reviewed by me and considered in my medical decision making (see chart for details).  Vaginal pain, acute bilateral low back pain without sciatica  Urine pregnancy is negative, urinalysis negative, discussed findings with patient, they began prophylactic treatment with metronidazole given at ED visit but labs are pending, will treat per protocol, advised abstinence until lab results, treatment is complete and symptoms resolved, prescribed Flexeril for management of back pain and recommended RICE, heat massage stretching and activity as tolerated for support, advised increase fluid intake and good hygiene for additional supportive care, symptoms continue to persist advise follow-up with PCP or urgent care for reevaluation as needed  Symbicort and albuterol refilled, patient has been attempting to reach out to primary doctor but unable to make contact, attempted to call provider's office while in clinic Final Clinical Impressions(s) / UC Diagnoses   Final diagnoses:  Vaginal pain  Acute bilateral low back pain without sciatica     Discharge Instructions      Urine pregnancy is negative  Urinalysis is negative for bladder infection  You may prophylactically begin use of medication for bacterial vaginosis if you would like for you may hold off until testing results  Labs pending, you will be contacted if positive for any sti and treatment will be sent to the pharmacy, you will have to return to the clinic if positive for gonorrhea to receive treatment   Please refrain from having sex until labs results, if positive please  refrain from having sex until treatment complete and symptoms resolve   If positive for HIV, Syphilis, Chlamydia  gonorrhea or trichomoniasis please notify partner or partners so they may tested as well  Moving forward, it is recommended you use some form of protection against the transmission of sti infections  such as condoms or dental dams with each sexual encounter    Your pain is most likely caused by irritation to the muscles .  Take ibuprofen 600 to 800 mg every 6-8 hours for management of back pain, may use muscle relaxant every 8 hours as needed for additional comfort, be mindful this may make you feel sleepy  You may use heating pad in 15 minute intervals as needed for additional comfort, within the first 2-3 days you may find comfort in using ice in 10-15 minutes over affected area  Begin stretching affected area daily for 10 minutes as tolerated to further loosen muscles   When lying down place pillow underneath and between knees for support  Can try sleeping without pillow on firm mattress   Practice good posture: head back, shoulders back, chest forward, pelvis back and weight distributed evenly on both legs  If pain continues to persist or worsen you may follow-up with your primary doctor for reevaluation     ED Prescriptions     Medication Sig Dispense Auth. Provider   albuterol (VENTOLIN HFA) 108 (90 Base) MCG/ACT inhaler Inhale 2 puffs into the lungs 2 (two) times daily. in the morning and at bedtime. 18 each Coen Miyasato R, NP   budesonide-formoterol (SYMBICORT) 80-4.5 MCG/ACT inhaler Inhale 2 puffs into the lungs in the morning and at bedtime. 10.2 g Tyree Fluharty,  Elita Boone, NP   cyclobenzaprine (FLEXERIL) 5 MG tablet Take 1 tablet (5 mg total) by mouth 3 (three) times daily as needed for muscle spasms. 30 tablet Valinda Hoar, NP      PDMP not reviewed this encounter.   Valinda Hoar, NP 08/13/23 1200

## 2023-08-14 ENCOUNTER — Telehealth (HOSPITAL_COMMUNITY): Payer: Self-pay | Admitting: Emergency Medicine

## 2023-08-14 LAB — CERVICOVAGINAL ANCILLARY ONLY
Bacterial Vaginitis (gardnerella): POSITIVE — AB
Candida Glabrata: NEGATIVE
Candida Vaginitis: POSITIVE — AB
Chlamydia: NEGATIVE
Comment: NEGATIVE
Comment: NEGATIVE
Comment: NEGATIVE
Comment: NEGATIVE
Comment: NEGATIVE
Comment: NORMAL
Neisseria Gonorrhea: NEGATIVE
Trichomonas: NEGATIVE

## 2023-08-14 LAB — HIV ANTIBODY (ROUTINE TESTING W REFLEX): HIV Screen 4th Generation wRfx: NONREACTIVE

## 2023-08-14 LAB — RPR: RPR Ser Ql: NONREACTIVE

## 2023-08-14 MED ORDER — FLUCONAZOLE 150 MG PO TABS: 150.0000 mg | ORAL_TABLET | Freq: Once | ORAL | 0 refills | Status: AC

## 2023-08-14 NOTE — Telephone Encounter (Signed)
Diflucan for positive yeast

## 2023-08-22 ENCOUNTER — Telehealth: Payer: Medicaid Other | Admitting: Physician Assistant

## 2023-08-22 DIAGNOSIS — J4541 Moderate persistent asthma with (acute) exacerbation: Secondary | ICD-10-CM | POA: Diagnosis not present

## 2023-08-23 MED ORDER — PREDNISONE 20 MG PO TABS
40.0000 mg | ORAL_TABLET | Freq: Every day | ORAL | 0 refills | Status: DC
Start: 2023-08-23 — End: 2023-09-16

## 2023-08-23 NOTE — Progress Notes (Signed)
E-Visit for Asthma  Based on what you have shared with me, it looks like you may have a flare up of your asthma.  Asthma is a chronic (ongoing) lung disease which results in airway obstruction, inflammation and hyper-responsiveness.   Asthma symptoms vary from person to person, with common symptoms including nighttime awakening and decreased ability to participate in normal activities as a result of shortness of breath. It is often triggered by changes in weather, changes in the season, changes in air temperature, or inside (home, school, daycare or work) allergens such as animal dander, mold, mildew, woodstoves or cockroaches.   It can also be triggered by hormonal changes, extreme emotion, physical exertion or an upper respiratory tract illness.     It is important to identify the trigger, and then eliminate or avoid the trigger if possible.   If you have been prescribed medications to be taken on a regular basis, it is important to follow the asthma action plan and to follow guidelines to adjust medication in response to increasing symptoms of decreased peak expiratory flow rate  Treatment: I have prescribed: Prednisone 40mg by mouth per day for 5 - 7 days  HOME CARE Only take medications as instructed by your medical team. Consider wearing a mask or scarf to improve breathing air temperature have been shown to decrease irritation and decrease exacerbations Get rest. Taking a steamy shower or using a humidifier may help nasal congestion sand ease sore throat pain. You can place a towel over your head and breathe in the steam from hot water coming from a faucet. Using a saline nasal spray works much the same way.  Cough drops, hare candies and sore throat lozenges may ease your cough.  Avoid close contacts especially the very you and the elderly Cover your mouth if you cough or  sneeze Always remember to wash your hands.    GET HELP RIGHT AWAY IF: You develop worsening symptoms; breathlessness at rest, drowsy, confused or agitated, unable to speak in full sentences You have coughing fits You develop a severe headache or visual changes You develop shortness of breath, difficulty breathing or start having chest pain Your symptoms persist after you have completed your treatment plan If your symptoms do not improve within 10 days  MAKE SURE YOU Understand these instructions. Will watch your condition. Will get help right away if you are not doing well or get worse.   Your e-visit answers were reviewed by a board certified advanced clinical practitioner to complete your personal care plan, Depending upon the condition, your plan could have included both over the counter or prescription medications.   Please review your pharmacy choice. Your safety is important to us. If you have drug allergies check your prescription carefully.  You can use MyChart to ask questions about today's visit, request a non-urgent  call back, or ask for a work or school excuse for 24 hours related to this e-Visit. If it has been greater than 24 hours you will need to follow up with your provider, or enter a new e-Visit to address those concerns.   You will get an e-mail in the next two days asking about your experience. I hope that your e-visit has been valuable and will speed your recovery. Thank you for using e-visits.  I have spent 5 minutes in review of e-visit questionnaire, review and updating patient chart, medical decision making and response to patient.   Arnetta Odeh M Maris Bena, PA-C  

## 2023-08-30 ENCOUNTER — Telehealth: Payer: Medicaid Other | Admitting: Family

## 2023-08-30 DIAGNOSIS — N760 Acute vaginitis: Secondary | ICD-10-CM

## 2023-08-30 DIAGNOSIS — B9689 Other specified bacterial agents as the cause of diseases classified elsewhere: Secondary | ICD-10-CM

## 2023-08-30 MED ORDER — CLINDAMYCIN PHOSPHATE 2 % VA CREA
1.0000 | TOPICAL_CREAM | Freq: Every day | VAGINAL | Status: DC
Start: 2023-08-30 — End: 2023-08-31

## 2023-08-30 MED ORDER — METRONIDAZOLE 1 % EX GEL
Freq: Every day | CUTANEOUS | 0 refills | Status: DC
Start: 2023-08-30 — End: 2023-08-30

## 2023-08-30 NOTE — Progress Notes (Signed)

## 2023-08-31 ENCOUNTER — Telehealth: Payer: Medicaid Other | Admitting: Family Medicine

## 2023-08-31 DIAGNOSIS — B9689 Other specified bacterial agents as the cause of diseases classified elsewhere: Secondary | ICD-10-CM

## 2023-08-31 DIAGNOSIS — N760 Acute vaginitis: Secondary | ICD-10-CM

## 2023-08-31 MED ORDER — CLINDAMYCIN PHOSPHATE 2 % VA CREA
1.0000 | TOPICAL_CREAM | Freq: Every day | VAGINAL | Status: AC
Start: 2023-08-31 — End: 2023-09-07

## 2023-08-31 MED ORDER — CLINDAMYCIN PHOSPHATE 2 % VA CREA
1.0000 | TOPICAL_CREAM | Freq: Every day | VAGINAL | Status: DC
Start: 2023-08-31 — End: 2023-08-31

## 2023-08-31 NOTE — Progress Notes (Signed)
Previous visit charged- this was message only--DWB

## 2023-08-31 NOTE — Addendum Note (Signed)
Addended by: Georgana Curio on: 08/31/2023 01:40 PM   Modules accepted: Orders

## 2023-08-31 NOTE — Addendum Note (Signed)
Addended by: Georgana Curio on: 08/31/2023 06:00 PM   Modules accepted: Orders

## 2023-09-01 MED ORDER — METRONIDAZOLE 500 MG PO TABS
500.0000 mg | ORAL_TABLET | Freq: Two times a day (BID) | ORAL | 0 refills | Status: DC
Start: 2023-09-01 — End: 2023-09-16

## 2023-09-01 NOTE — Addendum Note (Signed)
Addended by: [name] A on: 09/01/2023 11:36 AM   Modules accepted: Orders

## 2023-09-10 ENCOUNTER — Telehealth: Payer: Medicaid Other

## 2023-09-10 ENCOUNTER — Telehealth: Payer: Medicaid Other | Admitting: Physician Assistant

## 2023-09-10 DIAGNOSIS — N760 Acute vaginitis: Secondary | ICD-10-CM

## 2023-09-10 DIAGNOSIS — R3989 Other symptoms and signs involving the genitourinary system: Secondary | ICD-10-CM

## 2023-09-10 MED ORDER — CEPHALEXIN 500 MG PO CAPS
500.0000 mg | ORAL_CAPSULE | Freq: Two times a day (BID) | ORAL | 0 refills | Status: DC
Start: 2023-09-10 — End: 2023-09-10

## 2023-09-10 MED ORDER — CEPHALEXIN 500 MG PO CAPS
500.0000 mg | ORAL_CAPSULE | Freq: Two times a day (BID) | ORAL | 0 refills | Status: DC
Start: 2023-09-10 — End: 2023-09-16

## 2023-09-10 NOTE — Addendum Note (Signed)
Addended by: Margaretann Loveless on: 09/10/2023 02:16 PM   Modules accepted: Orders

## 2023-09-10 NOTE — Progress Notes (Signed)

## 2023-09-11 NOTE — Progress Notes (Signed)
Because you were just seen yesterday as well for suspected UTI and giving ongoing vaginitis symptoms, I feel your condition warrants further evaluation and I recommend that you be seen in a face to face visit.   NOTE: There will be NO CHARGE for this eVisit   If you are having a true medical emergency please call 911.      For an urgent face to face visit, [place] has eight urgent care centers for your convenience:   NEW!! [facility] at [facility] Get Driving Directions [phone] [address], [address] [place], [address]    [facility]gent Care Center at [facility] Get Driving Directions [phone] [address] [place], [place] [address]   [facility] Health Urgent Care Center [facility]) Get Driving Directions [phone] [address] [place], [place] [address]  [facility] Health Urgent Care Center [facility]) Get Driving Directions [phone] [address] [place],  [place]  [address]  [facility] Health Urgent Care Center [facility] - at [employer]  [phone] [address] [place],  [place] [address]   [facility] Health Urgent Care at [facility] Get Driving Directions [phone] [address] [place], [place] [address]   [facility] Health Urgent Care at [facility] Get Driving Directions  [phone] [address] [place], [place] [address]   [facility] Health Urgent Care at [facility] Directions [phone] [address] [place], [place] [address]  Your MyChart E-visit questionnaire answers were reviewed by a board certified advanced clinical practitioner to complete your personal care plan based on your specific symptoms.  Thank you for using e-Visits.

## 2023-09-16 ENCOUNTER — Ambulatory Visit
Admission: EM | Admit: 2023-09-16 | Discharge: 2023-09-16 | Disposition: A | Discharge status: Home / Self Care | Payer: Medicaid Other | Attending: Physician Assistant | Admitting: Physician Assistant

## 2023-09-16 ENCOUNTER — Telehealth: Payer: Self-pay | Admitting: Physician Assistant

## 2023-09-16 DIAGNOSIS — Z760 Encounter for issue of repeat prescription: Secondary | ICD-10-CM | POA: Diagnosis not present

## 2023-09-16 DIAGNOSIS — N760 Acute vaginitis: Secondary | ICD-10-CM | POA: Insufficient documentation

## 2023-09-16 DIAGNOSIS — J4521 Mild intermittent asthma with (acute) exacerbation: Secondary | ICD-10-CM | POA: Insufficient documentation

## 2023-09-16 DIAGNOSIS — Z113 Encounter for screening for infections with a predominantly sexual mode of transmission: Secondary | ICD-10-CM | POA: Diagnosis not present

## 2023-09-16 DIAGNOSIS — N898 Other specified noninflammatory disorders of vagina: Secondary | ICD-10-CM

## 2023-09-16 LAB — POCT URINALYSIS DIP (MANUAL ENTRY)
Bilirubin, UA: NEGATIVE
Blood, UA: NEGATIVE
Glucose, UA: NEGATIVE mg/dL
Ketones, POC UA: NEGATIVE mg/dL
Leukocytes, UA: NEGATIVE
Nitrite, UA: NEGATIVE
Protein Ur, POC: NEGATIVE mg/dL
Spec Grav, UA: >=1.03 — AB (ref 1.010–1.025)
Urobilinogen, UA: 0.2 U/dL
pH, UA: 5.5 (ref 5.0–8.0)

## 2023-09-16 MED ORDER — PREDNISONE 20 MG PO TABS
40.0000 mg | ORAL_TABLET | Freq: Every day | ORAL | 0 refills | Status: AC
Start: 2023-09-16 — End: 2023-09-20

## 2023-09-16 MED ORDER — BUDESONIDE-FORMOTEROL FUMARATE 80-4.5 MCG/ACT IN AERO: 2.0000 | INHALATION_SPRAY | Freq: Two times a day (BID) | RESPIRATORY_TRACT | 2 refills | Status: DC

## 2023-09-16 MED ORDER — FLUCONAZOLE 150 MG PO TABS
150.0000 mg | ORAL_TABLET | ORAL | 0 refills | Status: AC
Start: 2023-09-16 — End: 2023-09-20

## 2023-09-16 MED ORDER — METRONIDAZOLE 0.75 % VA GEL: 1.0000 | Freq: Every day | VAGINAL | 0 refills | Status: DC

## 2023-09-16 MED ORDER — CLINDAMYCIN PHOSPHATE 2 % VA CREA: 1.0000 | TOPICAL_CREAM | Freq: Every day | VAGINAL | 0 refills | Status: DC

## 2023-09-16 MED ORDER — ALBUTEROL SULFATE (2.5 MG/3ML) 0.083% IN NEBU
2.5000 mg | INHALATION_SOLUTION | Freq: Once | RESPIRATORY_TRACT | Status: AC
  Administered 2023-09-16: 2.5 mg via RESPIRATORY_TRACT

## 2023-09-16 NOTE — ED Provider Notes (Signed)
[name]    CSN: [id number] Arrival date & time: 09/16/23  0809      History   Chief Complaint Chief Complaint  Patient presents with   Urinary Frequency    HPI SAYSHA MENTA is a 27 y.o. female.   Patient presents today with several concerns.  Her primary concern today is malodorous urine with associated vaginal irritation.  She is concerned that she has recurrent bacterial vaginosis as she has had this multiple times in the past.  She was last seen and treated by our clinic on 08/13/2023.  She denies any dysuria, urinary frequency, urinary urgency.  Denies any abdominal pain, pelvic pain, fever, nausea, vomiting.  She is no specific concern for STI but does have a new partner and so is interested in testing.  She did complete a e-visit and was given cephalexin recently but denies additional antibiotics.  Denies any changes to personal hygiene products including soaps or detergents.  She is calm for that she is not pregnant.  In addition, patient is requesting a refill of her Symbicort.  She has a history of asthma and has had multiple hospitalizations but has not had hospitalizations since she started maintenance medication several years ago.  She is in between primary care providers and so has run out of this medication with her last dose approximately a week ago.  As result she has been using her albuterol more frequently but this has been ineffective.  She denies any recent steroids in the past 90 days.  She has had an increasing cough and shortness of breath particularly with activity.  She denies any fever, congestion, nausea/vomiting, known sick contacts.    Past Medical History:  Diagnosis Date   Asthma    Chlamydia     Patient Active Problem List   Diagnosis Date Noted   Acute right-sided low back pain with right-sided sciatica 06/18/2022   Leukemoid reaction 06/18/2022   OSA (obstructive sleep apnea) 05/14/2022   Morbid obesity (HCC) 226 lbs BMI=37.2  10/20/2019   Asthma exacerbation 12/08/2018    Past Surgical History:  Procedure Laterality Date   NO PAST SURGERIES      OB History     Gravida  2   Para      Term      Preterm      AB  1   Living  0      SAB  1   IAB      Ectopic      Multiple      Live Births               Home Medications    Prior to Admission medications   Medication Sig Start Date End Date Taking? Authorizing Provider  clindamycin (CLEOCIN) 2 % vaginal cream Place 1 Applicatorful vaginally at bedtime. 09/16/23  Yes Riggs Dineen K, PA-C  fluconazole (DIFLUCAN) 150 MG tablet Take 1 tablet (150 mg total) by mouth every 3 (three) days for 2 doses. 09/16/23 09/20/23 Yes Trenton Passow K, PA-C  predniSONE (DELTASONE) 20 MG tablet Take 2 tablets (40 mg total) by mouth daily for 4 days. 09/16/23 09/20/23 Yes Leslye Puccini K, PA-C  albuterol (VENTOLIN HFA) 108 (90 Base) MCG/ACT inhaler Inhale 2 puffs into the lungs 2 (two) times daily. in the morning and at bedtime. 08/13/23   Valinda Hoar, NP  budesonide-formoterol (SYMBICORT) 80-4.5 MCG/ACT inhaler Inhale 2 puffs into the lungs in the morning and at bedtime. 09/16/23  Deveron Shamoon K, PA-C  cetirizine (ZYRTEC) 10 MG tablet Take 1 tablet (10 mg total) by mouth daily. 10/07/22   Junie Spencer, FNP  cyclobenzaprine (FLEXERIL) 5 MG tablet Take 1 tablet (5 mg total) by mouth 3 (three) times daily as needed for muscle spasms. 08/13/23   Valinda Hoar, NP  Norethindrone Acetate-Ethinyl Estradiol (JUNEL 1.5/30) 1.5-30 MG-MCG tablet Take 1 tablet by mouth daily. 06/20/22   Doreene Burke, CNM    Family History Family History  Problem Relation Age of Onset   Asthma Mother    Asthma Father    Asthma Maternal Grandmother     Social History Social History   Tobacco Use   Smoking status: Never   Smokeless tobacco: Never   Tobacco comments:    Never used e-cigarettes  Vaping Use   Vaping status: Never Used  Substance Use Topics    Alcohol use: Not Currently    Alcohol/week: 1.0 standard drink of alcohol    Types: 1 Shots of liquor per week    Comment: last use 07/2022 "occassionally"   Drug use: Not Currently    Types: Marijuana    Comment: last use 2021     Allergies   Egg-derived products, Iohexol, Montelukast, Montelukast sodium, Fish-derived products, Metronidazole, and Penicillins   Review of Systems Review of Systems  Constitutional:  Positive for activity change. Negative for appetite change, fatigue and fever.  HENT:  Negative for congestion, sinus pressure, sneezing and sore throat.   Respiratory:  Positive for cough and shortness of breath. Negative for chest tightness and wheezing.   Cardiovascular:  Negative for chest pain.  Gastrointestinal:  Negative for abdominal pain, diarrhea, nausea and vomiting.  Genitourinary:  Positive for vaginal discharge. Negative for dysuria, frequency, genital sores, urgency, vaginal bleeding and vaginal pain.  Musculoskeletal:  Negative for arthralgias and myalgias.     Physical Exam Triage Vital Signs ED Triage Vitals  Encounter Vitals Group     BP 09/16/23 0827 104/72     Systolic BP Percentile --      Diastolic BP Percentile --      Pulse Rate 09/16/23 0827 64     Resp 09/16/23 0827 18     Temp 09/16/23 0827 97.8 F (36.6 C)     Temp src --      SpO2 09/16/23 0827 98 %     Weight --      Height --      Head Circumference --      Peak Flow --      Pain Score 09/16/23 0821 3     Pain Loc --      Pain Education --      Exclude from Growth Chart --    No data found.  Updated Vital Signs BP 104/72   Pulse 64   Temp 97.8 F (36.6 C)   Resp 18   SpO2 98%   Visual Acuity Right Eye Distance:   Left Eye Distance:   Bilateral Distance:    Right Eye Near:   Left Eye Near:    Bilateral Near:     Physical Exam Vitals reviewed.  Constitutional:      General: She is awake. She is not in acute distress.    Appearance: Normal appearance. She is  well-developed. She is not ill-appearing.     Comments: Very pleasant female appears stated age in no acute distress sitting comfortably in exam room  HENT:     Head: Normocephalic and atraumatic.  Mouth/Throat:     Pharynx: Uvula midline. No oropharyngeal exudate or posterior oropharyngeal erythema.  Cardiovascular:     Rate and Rhythm: Normal rate and regular rhythm.     Heart sounds: Normal heart sounds, S1 normal and S2 normal. No murmur heard. Pulmonary:     Effort: Pulmonary effort is normal.     Breath sounds: Examination of the right-lower field reveals decreased breath sounds. Examination of the left-lower field reveals decreased breath sounds. Decreased breath sounds present. No wheezing, rhonchi or rales.     Comments: Improvement of breath sounds following in office breathing treatment. Abdominal:     General: Bowel sounds are normal.     Palpations: Abdomen is soft.     Tenderness: There is abdominal tenderness in the suprapubic area. There is no right CVA tenderness, left CVA tenderness, guarding or rebound. Negative signs include McBurney's sign and psoas sign.  Psychiatric:        Behavior: Behavior is cooperative.      UC Treatments / Results  Labs (all labs ordered are listed, but only abnormal results are displayed) Labs Reviewed  POCT URINALYSIS DIP (MANUAL ENTRY) - Abnormal; Notable for the following components:      Result Value   Clarity, UA turbid (*)    Spec Grav, UA >=1.030 (*)    All other components within normal limits  CERVICOVAGINAL ANCILLARY ONLY    EKG   Radiology No results found.  Procedures Procedures (including critical care time)  Medications Ordered in UC Medications  albuterol (PROVENTIL) (2.5 MG/3ML) 0.083% nebulizer solution 2.5 mg (2.5 mg Nebulization Given 09/16/23 0839)    Initial Impression / Assessment and Plan / UC Course  I have reviewed the triage vital signs and the nursing notes.  Pertinent labs & imaging  results that were available during my care of the patient were reviewed by me and considered in my medical decision making (see chart for details).     Patient is well-appearing, afebrile, nontoxic, nontachycardic.  No evidence of acute infection on physical exam that warrant initiation of systemic antibiotics.  I suspect that her ongoing shortness of breath/cough symptoms are related to asthma exacerbation.  Given her increased use of albuterol will restart her maintenance medication of Symbicort.  Discussed that she is to rinse her mouth following use of this medication.  She was given breathing treatment in clinic with improvement of symptoms.  Will start prednisone 40 mg for the next 4 days and we discussed that she is not to take NSAIDs with this medication but can use Tylenol as needed.  If her symptoms or not improving or if anything worsens she is to return for reevaluation.  Strict return precautions given.  Recommend close follow-up with PCP.  All questions answered patient satisfaction.  I suspect that her symptoms are related to acute vaginitis.  Urine was obtained that showed dehydration but no evidence of infection.  She was encouraged to push fluids.  Requested urine pregnancy but patient reports she had just taken a pregnancy test and had her period so she is confident that she is not pregnant and declined this testing today.  STI swab was collected and is pending.  Will treat bacterial vaginosis with intravaginal clindamycin as patient reports she has not tolerated or had improvement in symptoms with oral metronidazole and has metronidazole gel listed as an allergy in her chart.  She was also given several doses of Diflucan to help manage her symptoms.  We will contact her if  she needs to arrange any additional treatment based on her swab results.  Recommended that she wear loosefitting cotton underwear and use hypoallergenic soaps and detergents.  Discussed that if anything changes or worsen  she should return for reevaluation.  Strict return precautions given.  All questions answered to patient satisfaction.  Final Clinical Impressions(s) / UC Diagnoses   Final diagnoses:  Vaginal discharge  Mild intermittent asthma with acute exacerbation  Acute vaginitis     Discharge Instructions      We reviewed you for an asthma exacerbation.  I have refilled her Symbicort.  In the meantime please start prednisone 40 mg for 4 days.  Do not take NSAIDs with this medication due to risk of GI bleeding including aspirin, ibuprofen/Advil, naproxen/Aleve.  You can use Tylenol/acetaminophen as needed.  Use your albuterol as needed for shortness of breath.  Make sure you rinse your mouth following use of Symbicort.  If your symptoms are not improving or if anything worsens please return for reevaluation.  Your urine was normal with no evidence of infection.  There was some evidence of dehydration so make sure you are drinking plenty fluid.  I suspect your symptoms are more related to vaginal irritation.  Start clindamycin gel at bedtime.  Take Diflucan 1 dose today and a second dose in 3 days if your symptoms persist.  We will contact you if we need to arrange additional treatment based on your results.  Wear loosefitting cotton underwear and use hypoallergenic soaps and detergents.  If anything worsens or changes please return for reevaluation.     ED Prescriptions     Medication Sig Dispense Auth. Provider   budesonide-formoterol (SYMBICORT) 80-4.5 MCG/ACT inhaler Inhale 2 puffs into the lungs in the morning and at bedtime. 10.2 g Katheryn Culliton K, PA-C   clindamycin (CLEOCIN) 2 % vaginal cream Place 1 Applicatorful vaginally at bedtime. 40 g Leanza Shepperson K, PA-C   fluconazole (DIFLUCAN) 150 MG tablet Take 1 tablet (150 mg total) by mouth every 3 (three) days for 2 doses. 2 tablet Azizah Lisle K, PA-C   predniSONE (DELTASONE) 20 MG tablet Take 2 tablets (40 mg total) by mouth daily for 4 days. 8  tablet Casha Estupinan, Noberto Retort, PA-C      PDMP not reviewed this encounter.   Jeani Hawking, PA-C 09/16/23 4098

## 2023-09-16 NOTE — ED Triage Notes (Signed)
Patient to Urgent Care with complaints of vaginal discharge and burning. Reports foul odor to her urine.   Reports previously prescribed flagyl did not provide relief. Believes this actually caused a yeast infection. States she would like to try the gel.   Denies any concerns for STDs.

## 2023-09-16 NOTE — Telephone Encounter (Signed)
Received call from patient that clindamycin gel sent to treat suspected bacterial vaginosis was not covered by insurance.  Contacted her to discuss options including oral clindamycin versus metronidazole gel.  Patient reports that she has tolerated metronidazole gel in the past and she is unsure why it is listed on her chart as an allergy.  She has never had a reaction to this before.  After reviewing her options she preferred to try metronidazole gel and so this was sent to her pharmacy.  All questions were answered to patient satisfaction.

## 2023-09-16 NOTE — Discharge Instructions (Signed)
We reviewed you for an asthma exacerbation.  I have refilled her Symbicort.  In the meantime please start prednisone 40 mg for 4 days.  Do not take NSAIDs with this medication due to risk of GI bleeding including aspirin, ibuprofen/Advil, naproxen/Aleve.  You can use Tylenol/acetaminophen as needed.  Use your albuterol as needed for shortness of breath.  Make sure you rinse your mouth following use of Symbicort.  If your symptoms are not improving or if anything worsens please return for reevaluation.  Your urine was normal with no evidence of infection.  There was some evidence of dehydration so make sure you are drinking plenty fluid.  I suspect your symptoms are more related to vaginal irritation.  Start clindamycin gel at bedtime.  Take Diflucan 1 dose today and a second dose in 3 days if your symptoms persist.  We will contact you if we need to arrange additional treatment based on your results.  Wear loosefitting cotton underwear and use hypoallergenic soaps and detergents.  If anything worsens or changes please return for reevaluation.

## 2023-09-17 LAB — CERVICOVAGINAL ANCILLARY ONLY
Bacterial Vaginitis (gardnerella): POSITIVE — AB
Candida Glabrata: NEGATIVE
Candida Vaginitis: POSITIVE — AB
Chlamydia: NEGATIVE
Comment: NEGATIVE
Comment: NEGATIVE
Comment: NEGATIVE
Comment: NEGATIVE
Comment: NEGATIVE
Comment: NORMAL
Neisseria Gonorrhea: NEGATIVE
Trichomonas: NEGATIVE

## 2023-11-08 ENCOUNTER — Ambulatory Visit: Payer: Medicaid Other | Admitting: Nurse Practitioner

## 2023-11-08 ENCOUNTER — Encounter: Payer: Self-pay | Admitting: Nurse Practitioner

## 2023-11-08 VITALS — BP 114/67 | HR 68 | Ht 66.0 in | Wt 242.8 lb

## 2023-11-08 DIAGNOSIS — Z3009 Encounter for other general counseling and advice on contraception: Secondary | ICD-10-CM

## 2023-11-08 DIAGNOSIS — Z309 Encounter for contraceptive management, unspecified: Secondary | ICD-10-CM | POA: Diagnosis not present

## 2023-11-08 DIAGNOSIS — Z01419 Encounter for gynecological examination (general) (routine) without abnormal findings: Secondary | ICD-10-CM

## 2023-11-08 DIAGNOSIS — Z113 Encounter for screening for infections with a predominantly sexual mode of transmission: Secondary | ICD-10-CM

## 2023-11-08 LAB — WET PREP FOR TRICH, YEAST, CLUE
Trichomonas Exam: NEGATIVE
Yeast Exam: NEGATIVE

## 2023-11-08 LAB — HM HIV SCREENING LAB: HM HIV Screening: NEGATIVE

## 2023-11-08 LAB — HM HEPATITIS C SCREENING LAB: HM Hepatitis Screen: NEGATIVE

## 2023-11-08 MED ORDER — NORETHINDRONE ACET-ETHINYL EST 1.5-30 MG-MCG PO TABS
1.0000 | ORAL_TABLET | Freq: Every day | ORAL | 11 refills | Status: AC
Start: 2023-11-08 — End: ?

## 2023-11-08 NOTE — Progress Notes (Signed)
Pt is here for PE, OCP's and STD testing.  Wet mount results reviewed, no treatment required per SO.  FP packet given.  Rx for OCP's sent to pt's pharmacy.  Berdie Ogren, RN

## 2023-11-08 NOTE — Progress Notes (Cosign Needed)
[facility] [address] Main Number: [phone]  Family Planning Visit- Repeat Yearly Visit  Subjective:  Kathryn Nash is a 27 y.o. G2P0010  being seen today for an annual wellness visit and to discuss contraception options.   The patient is currently using Oral Contraceptive for pregnancy prevention. Patient does not want a pregnancy in the next year.   They report they are looking for a method that provides Cycle control   Patient has the following medical problems: has Asthma exacerbation; Morbid obesity (HCC) 226 lbs BMI=37.2; OSA (obstructive sleep apnea); Acute right-sided low back pain with right-sided sciatica; and Leukemoid reaction on their problem list.  Chief Complaint  Patient presents with   Annual Exam    PE and STD testing    Patient reports only concern today is irregular menstrual bleeding. She states she had a period in October and prior to that it was February, which the irregular bleeding began after having a miscarriage in 2021. She states she takes her combined OCP late sometimes, but will never go a day without taking a pill. She indicates she was seen by an OBGYN earlier this year before missing periods to have testing done to look for abnormalities and nothing unusual was found. She endorses trying OTC hormonal balance gummies she got from [employer] that have not helped.  Patient has one female partner and practices vaginal only sex without condoms. She denies douching. Reports a history of gonorrhea and chlamydia but unsure of when.  Last PAP 07/06/21-NILM, HPV (-).  See flowsheet for other program required questions.   Body mass index is 39.19 kg/m. - Patient is eligible for diabetes screening based on BMI> 25 and age >35?  no HA1C ordered? not applicable  Patient reports 1 of partners in last year. Desires STI screening?  Yes   Has patient been screened once for HCV in the past?  Unsure  No  results found for: "HCVAB"  Does the patient have current of drug use, have a partner with drug use, and/or has been incarcerated since last result? Yes  If yes-- Screen for HCV through [facility]   Does the patient meet criteria for HBV testing? Yes  Criteria:  -Household, sexual or needle sharing contact with HBV -History of drug use -HIV positive -Those with known Hep C   Health Maintenance Due  Topic Date Due   INFLUENZA VACCINE  Never done   COVID-19 Vaccine (1 - 2023-24 season) Never done    Review of Systems  All other systems reviewed and are negative.   The following portions of the patient's history were reviewed and updated as appropriate: allergies, current medications, past family history, past medical history, past social history, past surgical history and problem list. Problem list updated.  Objective:   Vitals:   11/08/23 0830  BP: 114/67  Pulse: 68  Weight: 242 lb 12.8 oz (110.1 kg)  Height: 5\' 6"  (1.676 m)    Physical Exam Vitals and nursing note reviewed.  Constitutional:      Appearance: Normal appearance.  HENT:     Head: Normocephalic.     Salivary Glands: Right salivary gland is not diffusely enlarged or tender. Left salivary gland is not diffusely enlarged or tender.     Mouth/Throat:     Lips: Pink. No lesions.     Mouth: Mucous membranes are moist.     Tongue: No lesions. Tongue does not deviate from midline.  Pharynx: Oropharynx is clear. Uvula midline. No oropharyngeal exudate or posterior oropharyngeal erythema.     Tonsils: No tonsillar exudate.  Eyes:     General:        Right eye: No discharge.        Left eye: No discharge.  Neck:     Thyroid: No thyroid mass, thyromegaly or thyroid tenderness.     Trachea: Trachea and phonation normal.  Cardiovascular:     Rate and Rhythm: Normal rate and regular rhythm.     Heart sounds: Normal heart sounds, S1 normal and S2 normal.  Pulmonary:     Effort: Pulmonary effort is normal.      Breath sounds: Normal breath sounds and air entry.  Chest:  Breasts:    Tanner Score is 5.     Breasts are symmetrical.     Right: Normal. No swelling, bleeding, inverted nipple, mass, nipple discharge, skin change or tenderness.     Left: Normal. No swelling, bleeding, inverted nipple, mass, nipple discharge, skin change or tenderness.     Comments: Patient reports performing self-breast exams at home in the shower monthly. Abdominal:     General: Bowel sounds are normal. There is no distension. There are no signs of injury.     Palpations: Abdomen is soft.     Tenderness: There is no abdominal tenderness. There is no guarding or rebound.  Genitourinary:    Comments: Declined genital exam- no symptoms, self swabbed Lymphadenopathy:     Head:     Right side of head: No submental, submandibular, tonsillar, preauricular or posterior auricular adenopathy.     Left side of head: No submental, submandibular, tonsillar, preauricular or posterior auricular adenopathy.     Cervical: No cervical adenopathy.     Right cervical: No superficial or posterior cervical adenopathy.    Left cervical: No superficial or posterior cervical adenopathy.     Upper Body:     Right upper body: No supraclavicular or axillary adenopathy.     Left upper body: No supraclavicular or axillary adenopathy.  Skin:    General: Skin is warm and dry.     Comments: Skin tone appropriate for ethnicity.   Neurological:     Mental Status: She is alert and oriented to person, place, and time.  Psychiatric:        Attention and Perception: Attention normal.        Mood and Affect: Mood and affect normal.        Speech: Speech normal.        Behavior: Behavior normal. Behavior is cooperative.        Thought Content: Thought content normal.    Assessment and Plan:  Kathryn Nash is a 27 y.o. female G2P0010 presenting to the [facility] for an yearly wellness and contraception visit  1.  Screening for venereal disease  - HIV/HCV [facility] - Syphilis Serology, [facility] - WET PREP FOR TRICH, YEAST, CLUE - Chlamydia/Gonorrhea [facility]  2. Well woman exam CBE completed in clinic today. Next due 11/07/26.  Discussion with another provider in the office Arnetha Courser, CNM) regarding patient extended periods (8 months) without a menstrual cycle and if this is an indication for OBGYN referral. After learning from patient they were not taking the OCP at the same time each day and are taking OTC hormonal gummies, it was decided to encourage the patient to begin taking the pills at the same time, and stop  taking the gummies. Encouraged patient to try this for 3 months and if no change, patient encouraged to contact this office and another plan would be made. Patient was agreeable to this.   3. Family planning  - Norethindrone Acetate-Ethinyl Estradiol (JUNEL 1.5/30) 1.5-30 MG-MCG tablet; Take 1 tablet by mouth daily.  Dispense: 28 tablet; Refill: 11  Contraception counseling: Reviewed options based on patient desire and reproductive life plan. Patient is interested in Oral Contraceptive. This was provided to the patient today.   Risks, benefits, and typical effectiveness rates were reviewed.  Questions were answered.  Written information was also given to the patient to review.    The patient will follow up in  1 years for surveillance.  The patient was told to call with any further questions, or with any concerns about this method of contraception.  Emphasized use of condoms 100% of the time for STI prevention.  Educated on ECP and assessed need for ECP. Patient was NOT offered ECP based on NO unprotected sex.   Return in about 1 year (around 11/07/2024) for Annual well woman exam.  No future appointments.  Edmonia James, NP

## 2023-11-14 ENCOUNTER — Telehealth: Payer: Medicaid Other | Admitting: Nurse Practitioner

## 2023-11-14 DIAGNOSIS — N898 Other specified noninflammatory disorders of vagina: Secondary | ICD-10-CM

## 2023-11-14 NOTE — Progress Notes (Signed)
[name],  If you have had a new partner in the past two months we recommend STI testing. When you visited your doctor on the 6th is does not appear that a full STI panel was performed.   We recommend follow up with that office    I feel your condition warrants further evaluation and I recommend that you be seen for a face to face visit.  Please contact your primary care physician practice to be seen. Many offices offer virtual options to be seen via video if you are not comfortable going in person to a medical facility at this time.  NOTE: You will NOT be charged for this eVisit.  If you do not have a PCP, [facility] offers a free physician referral service available at [phone]. Our trained staff has the experience, knowledge and resources to put you in touch with a physician who is right for you.    If you are having a true medical emergency please call 911.   Your e-visit answers were reviewed by a board certified advanced clinical practitioner to complete your personal care plan.  Thank you for using e-Visits.

## 2023-11-15 ENCOUNTER — Encounter: Payer: Medicaid Other | Admitting: Physician Assistant

## 2023-11-15 ENCOUNTER — Telehealth: Payer: Medicaid Other | Admitting: Nurse Practitioner

## 2023-11-15 DIAGNOSIS — N898 Other specified noninflammatory disorders of vagina: Secondary | ICD-10-CM | POA: Diagnosis not present

## 2023-11-15 MED ORDER — FLUCONAZOLE 150 MG PO TABS
150.0000 mg | ORAL_TABLET | Freq: Once | ORAL | 0 refills | Status: AC
Start: 2023-11-15 — End: 2023-11-15

## 2023-11-15 NOTE — Progress Notes (Signed)
Duplicate; completed EV and did not cancel VV. Provider waited 10 minutes and patient did not show.  This encounter was created in error - please disregard.

## 2023-11-15 NOTE — Addendum Note (Signed)
Addended by: Viviano Simas E on: 11/15/2023 08:31 AM   Modules accepted: Level of Service

## 2023-11-15 NOTE — Progress Notes (Signed)

## 2023-11-15 NOTE — Progress Notes (Signed)
 [name],  If you have had a new partner in the past two months we recommend STI testing. When you visited your doctor on the 6th is does not appear that a full STI panel was performed.   We recommend follow up with that office    I feel your condition warrants further evaluation and I recommend that you be seen for a face to face visit.  Please contact your primary care physician practice to be seen. Many offices offer virtual options to be seen via video if you are not comfortable going in person to a medical facility at this time.  NOTE: You will NOT be charged for this eVisit.  If you do not have a PCP, [facility] offers a free physician referral service available at [phone]. Our trained staff has the experience, knowledge and resources to put you in touch with a physician who is right for you.    If you are having a true medical emergency please call 911.   Your e-visit answers were reviewed by a board certified advanced clinical practitioner to complete your personal care plan.  Thank you for using e-Visits.

## 2023-11-15 NOTE — Addendum Note (Signed)
Addended by: Harlow Mares on: 11/15/2023 08:57 AM   Modules accepted: Orders

## 2023-12-11 ENCOUNTER — Telehealth: Payer: Self-pay | Admitting: Family Medicine

## 2023-12-11 NOTE — Telephone Encounter (Signed)
 PT stated she came in recently and was prescribed birth control pills. PT stated she has been taken the pills as directed and now her menstrual cycle has been on for 3 weeks with extremely heavy bleeding and cramping.

## 2023-12-18 ENCOUNTER — Ambulatory Visit: Payer: Medicaid Other

## 2023-12-24 ENCOUNTER — Other Ambulatory Visit: Payer: Medicaid Other

## 2023-12-26 ENCOUNTER — Ambulatory Visit
Admission: EM | Admit: 2023-12-26 | Discharge: 2023-12-26 | Disposition: A | Discharge status: Home / Self Care | Payer: Medicaid Other | Attending: Emergency Medicine | Admitting: Emergency Medicine

## 2023-12-26 DIAGNOSIS — J4520 Mild intermittent asthma, uncomplicated: Secondary | ICD-10-CM | POA: Diagnosis present

## 2023-12-26 DIAGNOSIS — H9203 Otalgia, bilateral: Secondary | ICD-10-CM | POA: Diagnosis present

## 2023-12-26 DIAGNOSIS — N898 Other specified noninflammatory disorders of vagina: Secondary | ICD-10-CM | POA: Insufficient documentation

## 2023-12-26 LAB — POCT URINALYSIS DIP (MANUAL ENTRY)
Bilirubin, UA: NEGATIVE
Blood, UA: NEGATIVE
Glucose, UA: NEGATIVE mg/dL
Ketones, POC UA: NEGATIVE mg/dL
Nitrite, UA: NEGATIVE
Spec Grav, UA: 1.02 (ref 1.010–1.025)
Urobilinogen, UA: 1 U/dL
pH, UA: 7 (ref 5.0–8.0)

## 2023-12-26 LAB — POCT URINE PREGNANCY: Preg Test, Ur: NEGATIVE

## 2023-12-26 MED ORDER — BUDESONIDE-FORMOTEROL FUMARATE 80-4.5 MCG/ACT IN AERO: 2.0000 | INHALATION_SPRAY | Freq: Two times a day (BID) | RESPIRATORY_TRACT | 2 refills | Status: DC

## 2023-12-26 MED ORDER — PREDNISONE 10 MG (21) PO TBPK: ORAL_TABLET | Freq: Every day | ORAL | 0 refills | Status: DC

## 2023-12-26 MED ORDER — IPRATROPIUM BROMIDE 0.03 % NA SOLN: 2.0000 | Freq: Two times a day (BID) | NASAL | 0 refills | Status: AC

## 2023-12-26 MED ORDER — METRONIDAZOLE 0.75 % VA GEL: 1.0000 | Freq: Every day | VAGINAL | 0 refills | Status: DC

## 2023-12-26 NOTE — ED Provider Notes (Signed)
Kathryn Nash    CSN: [id number] Arrival date & time: 12/26/23  1242      History   Chief Complaint Chief Complaint  Patient presents with   Abdominal Pain   Vaginal Discharge        Ear Pain   Possible Pregnancy   SEXUALLY TRANSMITTED DISEASE    HPI Kathryn Nash is a 28 y.o. female.   Patient presents for evaluation of vaginal discharge, vaginal irritation, lower abdominal and back pain, incomplete bladder emptying present for 3 days.  Has not attempted treatment.  Endorses history of reoccurring BV.  Denies sexual activity but would like vaginal STD testing.  Denies urinary frequency, urgency, dysuria, hematuria, fever, vaginal odor or itching.  Patient concerned with nasal congestion and bilateral ear pain present for 7 days.  Pain radiating from the ear into the throat causing pain with swallowing.  Has not attempted treatment.  Denies fever, cough, sore throat.  Questing refill on Symbicort inhaler.  History of asthma, denies shortness of breath or wheezing.  Past Medical History:  Diagnosis Date   Asthma    Chlamydia     Patient Active Problem List   Diagnosis Date Noted   Acute right-sided low back pain with right-sided sciatica 06/18/2022   Leukemoid reaction 06/18/2022   OSA (obstructive sleep apnea) 05/14/2022   Morbid obesity (HCC) 226 lbs BMI=37.2 10/20/2019   Asthma exacerbation 12/08/2018    Past Surgical History:  Procedure Laterality Date   NO PAST SURGERIES      OB History     Gravida  2   Para      Term      Preterm      AB  1   Living  0      SAB  1   IAB      Ectopic      Multiple      Live Births               Home Medications    Prior to Admission medications   Medication Sig Start Date End Date Taking? Authorizing Provider  ipratropium (ATROVENT) 0.03 % nasal spray Place 2 sprays into both nostrils every 12 (twelve) hours. 12/26/23  Yes Johnathon Mittal R, NP  metroNIDAZOLE (METROGEL) 0.75 % vaginal  gel Place 1 Applicatorful vaginally at bedtime. 12/26/23  Yes Garrell Flagg R, NP  predniSONE (STERAPRED UNI-PAK 21 TAB) 10 MG (21) TBPK tablet Take by mouth daily. Take 6 tabs by mouth daily  for 1 days, then 5 tabs for 1 days, then 4 tabs for 1 days, then 3 tabs for 1 days, 2 tabs for 1 days, then 1 tab by mouth daily for 1 days 12/26/23  Yes Shellie Goettl R, NP  albuterol (VENTOLIN HFA) 108 (90 Base) MCG/ACT inhaler Inhale 2 puffs into the lungs 2 (two) times daily. in the morning and at bedtime. 08/13/23   Valinda Hoar, NP  budesonide-formoterol (SYMBICORT) 80-4.5 MCG/ACT inhaler Inhale 2 puffs into the lungs in the morning and at bedtime. 12/26/23   Valinda Hoar, NP  cetirizine (ZYRTEC) 10 MG tablet Take 1 tablet (10 mg total) by mouth daily. Patient not taking: Reported on 11/08/2023 10/07/22   Junie Spencer, FNP  cyclobenzaprine (FLEXERIL) 5 MG tablet Take 1 tablet (5 mg total) by mouth 3 (three) times daily as needed for muscle spasms. 08/13/23   Valinda Hoar, NP  Norethindrone Acetate-Ethinyl Estradiol (JUNEL 1.5/30) 1.5-30 MG-MCG tablet Take 1 tablet  by mouth daily. 11/08/23   Edmonia James, NP    Family History Family History  Problem Relation Age of Onset   Asthma Mother    Asthma Father    Diabetes Maternal Grandmother    Asthma Maternal Grandmother     Social History Social History   Tobacco Use   Smoking status: Former    Types: Cigarettes   Smokeless tobacco: Never   Tobacco comments:    Never used e-cigarettes  Vaping Use   Vaping status: Never Used  Substance Use Topics   Alcohol use: Not Currently    Alcohol/week: 1.0 standard drink of alcohol    Types: 1 Shots of liquor per week    Comment: last use 07/2022 "occassionally"   Drug use: Not Currently    Types: Marijuana    Comment: last use 2021     Allergies   Egg-derived products, Iohexol, Montelukast, Montelukast sodium, Fish-derived products, and Penicillins   Review of Systems Review  of Systems   Physical Exam Triage Vital Signs ED Triage Vitals  Encounter Vitals Group     BP 12/26/23 1252 115/74     Systolic BP Percentile --      Diastolic BP Percentile --      Pulse Rate 12/26/23 1252 80     Resp 12/26/23 1252 17     Temp 12/26/23 1252 99.5 F (37.5 C)     Temp Source 12/26/23 1252 Oral     SpO2 12/26/23 1252 95 %     Weight --      Height --      Head Circumference --      Peak Flow --      Pain Score 12/26/23 1251 3     Pain Loc --      Pain Education --      Exclude from Growth Chart --    No data found.  Updated Vital Signs BP 115/74 (BP Location: Left Arm)   Pulse 80   Temp 99.5 F (37.5 C) (Oral)   Resp 17   LMP 11/24/2023 (Exact Date)   SpO2 95%   Visual Acuity Right Eye Distance:   Left Eye Distance:   Bilateral Distance:    Right Eye Near:   Left Eye Near:    Bilateral Near:     Physical Exam Constitutional:      Appearance: Normal appearance.  HENT:     Right Ear: Ear canal and external ear normal. A middle ear effusion is present.     Left Ear: Ear canal and external ear normal. A middle ear effusion is present.     Nose: Congestion present.  Eyes:     Extraocular Movements: Extraocular movements intact.  Pulmonary:     Effort: Pulmonary effort is normal.  Abdominal:     General: Abdomen is flat. Bowel sounds are normal.     Palpations: Abdomen is soft.     Tenderness: There is no abdominal tenderness. There is no right CVA tenderness or left CVA tenderness.  Neurological:     Mental Status: She is alert and oriented to person, place, and time.      UC Treatments / Results  Labs (all labs ordered are listed, but only abnormal results are displayed) Labs Reviewed  POCT URINALYSIS DIP (MANUAL ENTRY) - Abnormal; Notable for the following components:      Result Value   Protein Ur, POC trace (*)    Leukocytes, UA Trace (*)    All other  components within normal limits  POCT URINE PREGNANCY  CERVICOVAGINAL  ANCILLARY ONLY    EKG   Radiology No results found.  Procedures Procedures (including critical care time)  Medications Ordered in UC Medications - No data to display  Initial Impression / Assessment and Plan / UC Course  I have reviewed the triage vital signs and the nursing notes.  Pertinent labs & imaging results that were available during my care of the patient were reviewed by me and considered in my medical decision making (see chart for details).  Vaginal discharge, Taj of both ears, mild intermittent asthma without complication   Declined HIV and syphilis testing, endorses recently completed, treating prophylactically for BV with MetroGel, STI labs pending will treat per protocol, advised abstinence until lab results, and/or treatment is complete, advised condom use during all sexual encounters moving, may follow-up with urgent care as needed   Middle ear effusion noted bilaterally most consistent with eustachian tube dysfunction, no signs of infection, discussed with patient, prescribed prednisone and Atrovent, refilled Symbicort, recommended additional supportive care with follow-up as needed Final Clinical Impressions(s) / UC Diagnoses   Final diagnoses:  Vaginal discharge  Mild intermittent asthma without complication  Otalgia of both ears     Discharge Instructions      Today you are being treated prophylactically for  Bacterial vaginosis   Take metrogel every night before bed for 7 days s, do not drink alcohol while using medication, this will make you feel sick   Bacterial vaginosis which results from an overgrowth of one on several organisms that are normally present in your vagina. Vaginosis is an inflammation of the vagina that can result in discharge, itching and pain.  Labs pending 2-3 days, you will be contacted if positive for any sti and treatment will be sent to the pharmacy, you will have to return to the clinic if positive for gonorrhea to receive  treatment   Please refrain from having sex until labs results, if positive please refrain from having sex until treatment complete and symptoms resolve   If positive for, Chlamydia  gonorrhea or trichomoniasis please notify partner or partners so they may tested as well  Moving forward, it is recommended you use some form of protection against the transmission of sti infections  such as condoms or dental dams with each sexual encounter     In addition: Avoid baths, hot tubs and whirlpool spas.  Don't use scented or harsh soaps Avoid irritants. These include scented tampons and pads. Wipe from front to back after using the toilet. Don't douche. Your vagina doesn't require cleansing other than normal bathing.  Use a condom.  Wear cotton underwear, this fabric absorbs some moisture.    On exam there is no signs of infection to your ear but there is fluid sitting behind your ear which is typically caused by congestion  Begin prednisone every morning for 6 days do not attempt to reduce pain and help reduce pressure  Begin nasal spray every morning and every evening to help clear out congestion from the sinuses  Inhaler has been refilled     ED Prescriptions     Medication Sig Dispense Auth. Provider   metroNIDAZOLE (METROGEL) 0.75 % vaginal gel Place 1 Applicatorful vaginally at bedtime. 70 g Kyson Kupper R, NP   predniSONE (STERAPRED UNI-PAK 21 TAB) 10 MG (21) TBPK tablet Take by mouth daily. Take 6 tabs by mouth daily  for 1 days, then 5 tabs for 1 days, then  4 tabs for 1 days, then 3 tabs for 1 days, 2 tabs for 1 days, then 1 tab by mouth daily for 1 days 21 tablet Jaskiran Pata R, NP   ipratropium (ATROVENT) 0.03 % nasal spray Place 2 sprays into both nostrils every 12 (twelve) hours. 30 mL Britaney Espaillat R, NP   budesonide-formoterol (SYMBICORT) 80-4.5 MCG/ACT inhaler Inhale 2 puffs into the lungs in the morning and at bedtime. 10.2 g Valinda Hoar, NP      PDMP not  reviewed this encounter.   Valinda Hoar, NP 12/26/23 1431

## 2023-12-26 NOTE — ED Triage Notes (Signed)
Pt presents for STD testing. Reports white vaginal discharge x  3 days and lowr abd pain. Pt is requesting a pregnancy test.   Reports she also has bilateral ear pain x 1 wk.

## 2023-12-26 NOTE — Discharge Instructions (Addendum)
Today you are being treated prophylactically for  Bacterial vaginosis   Take metrogel every night before bed for 7 days s, do not drink alcohol while using medication, this will make you feel sick   Bacterial vaginosis which results from an overgrowth of one on several organisms that are normally present in your vagina. Vaginosis is an inflammation of the vagina that can result in discharge, itching and pain.  Labs pending 2-3 days, you will be contacted if positive for any sti and treatment will be sent to the pharmacy, you will have to return to the clinic if positive for gonorrhea to receive treatment   Please refrain from having sex until labs results, if positive please refrain from having sex until treatment complete and symptoms resolve   If positive for, Chlamydia  gonorrhea or trichomoniasis please notify partner or partners so they may tested as well  Moving forward, it is recommended you use some form of protection against the transmission of sti infections  such as condoms or dental dams with each sexual encounter     In addition: Avoid baths, hot tubs and whirlpool spas.  Don't use scented or harsh soaps Avoid irritants. These include scented tampons and pads. Wipe from front to back after using the toilet. Don't douche. Your vagina doesn't require cleansing other than normal bathing.  Use a condom.  Wear cotton underwear, this fabric absorbs some moisture.    On exam there is no signs of infection to your ear but there is fluid sitting behind your ear which is typically caused by congestion  Begin prednisone every morning for 6 days do not attempt to reduce pain and help reduce pressure  Begin nasal spray every morning and every evening to help clear out congestion from the sinuses  Inhaler has been refilled

## 2023-12-27 ENCOUNTER — Telehealth (HOSPITAL_BASED_OUTPATIENT_CLINIC_OR_DEPARTMENT_OTHER): Payer: Self-pay

## 2023-12-27 LAB — CERVICOVAGINAL ANCILLARY ONLY
Bacterial Vaginitis (gardnerella): POSITIVE — AB
Candida Glabrata: NEGATIVE
Candida Vaginitis: NEGATIVE
Chlamydia: NEGATIVE
Comment: NEGATIVE
Comment: NEGATIVE
Comment: NEGATIVE
Comment: NEGATIVE
Comment: NEGATIVE
Comment: NORMAL
Neisseria Gonorrhea: NEGATIVE
Trichomonas: NEGATIVE

## 2023-12-27 MED ORDER — METRONIDAZOLE 500 MG PO TABS: 500.0000 mg | ORAL_TABLET | Freq: Two times a day (BID) | ORAL | 0 refills | Status: AC

## 2023-12-27 NOTE — Telephone Encounter (Signed)
Per protocol, pt requires tx with metronidazole. Rx sent to pharmacy on file.

## 2024-02-17 ENCOUNTER — Ambulatory Visit
Admission: EM | Admit: 2024-02-17 | Discharge: 2024-02-17 | Disposition: A | Discharge status: Home / Self Care | Attending: Emergency Medicine | Admitting: Emergency Medicine

## 2024-02-17 DIAGNOSIS — Z113 Encounter for screening for infections with a predominantly sexual mode of transmission: Secondary | ICD-10-CM | POA: Diagnosis present

## 2024-02-17 DIAGNOSIS — H6693 Otitis media, unspecified, bilateral: Secondary | ICD-10-CM | POA: Diagnosis not present

## 2024-02-17 LAB — POCT URINE PREGNANCY: Preg Test, Ur: NEGATIVE

## 2024-02-17 MED ORDER — AZITHROMYCIN 250 MG PO TABS: 250.0000 mg | ORAL_TABLET | Freq: Every day | ORAL | 0 refills | Status: DC

## 2024-02-17 NOTE — ED Provider Notes (Signed)
 [name]    CSN: [id number] Arrival date & time: 02/17/24  0955      History   Chief Complaint Chief Complaint  Patient presents with   Otalgia   SEXUALLY TRANSMITTED DISEASE    HPI Kathryn Nash is a 28 y.o. female.  Patient presents with right ear pain x 2 months.  No OTC medication taken today.  She reports she was treated with prednisone and nasal spray in January and improved but then ear pain returned after prednisone completed.  No fever, ear drainage, sore throat, cough, shortness of breath.  Patient also presents with request for STD testing.  No vaginal discharge, rash, pelvic pain, dysuria.  Her medical history includes bacterial vaginitis and chlamydia.  The history is provided by the patient and medical records.    Past Medical History:  Diagnosis Date   Asthma    Chlamydia     Patient Active Problem List   Diagnosis Date Noted   Acute right-sided low back pain with right-sided sciatica 06/18/2022   Leukemoid reaction 06/18/2022   OSA (obstructive sleep apnea) 05/14/2022   Morbid obesity (HCC) 226 lbs BMI=37.2 10/20/2019   Asthma exacerbation 12/08/2018    Past Surgical History:  Procedure Laterality Date   NO PAST SURGERIES      OB History     Gravida  2   Para      Term      Preterm      AB  1   Living  0      SAB  1   IAB      Ectopic      Multiple      Live Births               Home Medications    Prior to Admission medications   Medication Sig Start Date End Date Taking? Authorizing Provider  azithromycin (ZITHROMAX) 250 MG tablet Take 1 tablet (250 mg total) by mouth daily. Take first 2 tablets together, then 1 every day until finished. 02/17/24  Yes Mickie Bail, NP  albuterol (VENTOLIN HFA) 108 (90 Base) MCG/ACT inhaler Inhale 2 puffs into the lungs 2 (two) times daily. in the morning and at bedtime. 08/13/23   Valinda Hoar, NP  budesonide-formoterol (SYMBICORT) 80-4.5 MCG/ACT inhaler Inhale 2  puffs into the lungs in the morning and at bedtime. 12/26/23   Valinda Hoar, NP  cetirizine (ZYRTEC) 10 MG tablet Take 1 tablet (10 mg total) by mouth daily. Patient not taking: Reported on 11/08/2023 10/07/22   Junie Spencer, FNP  cyclobenzaprine (FLEXERIL) 5 MG tablet Take 1 tablet (5 mg total) by mouth 3 (three) times daily as needed for muscle spasms. Patient not taking: Reported on 02/17/2024 08/13/23   Valinda Hoar, NP  ipratropium (ATROVENT) 0.03 % nasal spray Place 2 sprays into both nostrils every 12 (twelve) hours. 12/26/23   White, Elita Boone, NP  metroNIDAZOLE (METROGEL) 0.75 % vaginal gel Place 1 Applicatorful vaginally at bedtime. Patient not taking: Reported on 02/17/2024 12/26/23   Valinda Hoar, NP  Norethindrone Acetate-Ethinyl Estradiol (JUNEL 1.5/30) 1.5-30 MG-MCG tablet Take 1 tablet by mouth daily. 11/08/23   Edmonia James, NP  predniSONE (STERAPRED UNI-PAK 21 TAB) 10 MG (21) TBPK tablet Take by mouth daily. Take 6 tabs by mouth daily  for 1 days, then 5 tabs for 1 days, then 4 tabs for 1 days, then 3 tabs for 1 days, 2 tabs for 1 days,  then 1 tab by mouth daily for 1 days Patient not taking: Reported on 02/17/2024 12/26/23   Valinda Hoar, NP    Family History Family History  Problem Relation Age of Onset   Asthma Mother    Asthma Father    Diabetes Maternal Grandmother    Asthma Maternal Grandmother     Social History Social History   Tobacco Use   Smoking status: Former    Types: Cigarettes   Smokeless tobacco: Never   Tobacco comments:    Never used e-cigarettes  Vaping Use   Vaping status: Never Used  Substance Use Topics   Alcohol use: Not Currently    Alcohol/week: 1.0 standard drink of alcohol    Types: 1 Shots of liquor per week    Comment: last use 07/2022 "occassionally"   Drug use: Not Currently    Types: Marijuana    Comment: last use 2021     Allergies   Egg-derived products, Iohexol, Montelukast, Montelukast sodium,  Fish-derived products, and Penicillins   Review of Systems Review of Systems  Constitutional:  Negative for chills and fever.  HENT:  Positive for ear pain. Negative for ear discharge and sore throat.   Respiratory:  Negative for cough and shortness of breath.   Genitourinary:  Negative for dysuria, flank pain, hematuria, pelvic pain and vaginal discharge.     Physical Exam Triage Vital Signs ED Triage Vitals  Encounter Vitals Group     BP 02/17/24 1037 125/78     Systolic BP Percentile --      Diastolic BP Percentile --      Pulse Rate 02/17/24 1037 66     Resp 02/17/24 1037 18     Temp 02/17/24 1037 98 F (36.7 C)     Temp src --      SpO2 02/17/24 1037 98 %     Weight --      Height --      Head Circumference --      Peak Flow --      Pain Score 02/17/24 1036 0     Pain Loc --      Pain Education --      Exclude from Growth Chart --    No data found.  Updated Vital Signs BP 125/78   Pulse 66   Temp 98 F (36.7 C)   Resp 18   SpO2 98%   Visual Acuity Right Eye Distance:   Left Eye Distance:   Bilateral Distance:    Right Eye Near:   Left Eye Near:    Bilateral Near:     Physical Exam Constitutional:      General: She is not in acute distress. HENT:     Right Ear: Tympanic membrane is erythematous.     Left Ear: Tympanic membrane is erythematous.     Nose: Nose normal.     Mouth/Throat:     Mouth: Mucous membranes are moist.     Pharynx: Oropharynx is clear.  Cardiovascular:     Rate and Rhythm: Normal rate and regular rhythm.     Heart sounds: Normal heart sounds.  Pulmonary:     Effort: Pulmonary effort is normal. No respiratory distress.     Breath sounds: Normal breath sounds.  Genitourinary:    Comments: Patient declines GU exam. Neurological:     Mental Status: She is alert.      UC Treatments / Results  Labs (all labs ordered are listed, but only abnormal results  are displayed) Labs Reviewed  POCT URINE PREGNANCY   CERVICOVAGINAL ANCILLARY ONLY    EKG   Radiology No results found.  Procedures Procedures (including critical care time)  Medications Ordered in UC Medications - No data to display  Initial Impression / Assessment and Plan / UC Course  I have reviewed the triage vital signs and the nursing notes.  Pertinent labs & imaging results that were available during my care of the patient were reviewed by me and considered in my medical decision making (see chart for details).    Bilateral otitis media. STD screening.  Afebrile and vital signs are stable.  Lungs are clear and O2 sat is 98% on room air.  Treating with Zithromax.  Instructed her to follow-up with her PCP if she is not improving.  Education provided on otitis media.  Patient obtained vaginal self swab for routine STD testing.  No symptoms at this time.  Discussed that we will call her if the test results show the need for treatment.  Instructed her to abstain from sexual activity until all test results are back.  She agrees to plan of care.  Final Clinical Impressions(s) / UC Diagnoses   Final diagnoses:  Bilateral otitis media, unspecified otitis media type  Screening for STD (sexually transmitted disease)     Discharge Instructions      Take the Zithromax as directed.  Follow-up with your primary care provider if your symptoms are not improving.   Your vaginal tests are pending.  If your test results are positive, we will call you.  You and your sexual partner(s) may require treatment at that time.  Do not have sexual activity for at least 7 days.         ED Prescriptions     Medication Sig Dispense Auth. Provider   azithromycin (ZITHROMAX) 250 MG tablet Take 1 tablet (250 mg total) by mouth daily. Take first 2 tablets together, then 1 every day until finished. 6 tablet Mickie Bail, NP      PDMP not reviewed this encounter.   Mickie Bail, NP 02/17/24 1059

## 2024-02-17 NOTE — Discharge Instructions (Addendum)
 Take the Zithromax as directed.  Follow-up with your primary care provider if your symptoms are not improving.   Your vaginal tests are pending.  If your test results are positive, we will call you.  You and your sexual partner(s) may require treatment at that time.  Do not have sexual activity for at least 7 days.

## 2024-02-17 NOTE — ED Triage Notes (Signed)
 Patient to Urgent Care with complaints of right sided ear pain. States she was treated with nasal spray/ steroid but little relief. Issue started in mid January.   Also requests std check up. Concern for BV.

## 2024-02-18 LAB — CERVICOVAGINAL ANCILLARY ONLY
Bacterial Vaginitis (gardnerella): POSITIVE — AB
Candida Glabrata: NEGATIVE
Candida Vaginitis: NEGATIVE
Chlamydia: NEGATIVE
Comment: NEGATIVE
Comment: NEGATIVE
Comment: NEGATIVE
Comment: NEGATIVE
Comment: NEGATIVE
Comment: NORMAL
Neisseria Gonorrhea: NEGATIVE
Trichomonas: NEGATIVE

## 2024-03-24 ENCOUNTER — Telehealth: Admitting: Physician Assistant

## 2024-03-24 DIAGNOSIS — B9689 Other specified bacterial agents as the cause of diseases classified elsewhere: Secondary | ICD-10-CM

## 2024-03-24 DIAGNOSIS — N760 Acute vaginitis: Secondary | ICD-10-CM | POA: Diagnosis not present

## 2024-03-24 MED ORDER — METRONIDAZOLE 0.75 % VA GEL: 1.0000 | Freq: Every day | VAGINAL | 0 refills | Status: DC

## 2024-03-24 NOTE — Progress Notes (Signed)

## 2024-03-24 NOTE — Progress Notes (Signed)
 I have spent 5 minutes in review of e-visit questionnaire, review and updating patient chart, medical decision making and response to patient.   Piedad Climes, PA-C

## 2024-04-08 ENCOUNTER — Ambulatory Visit
Admission: EM | Admit: 2024-04-08 | Discharge: 2024-04-08 | Disposition: A | Discharge status: Home / Self Care | Attending: Emergency Medicine | Admitting: Emergency Medicine

## 2024-04-08 ENCOUNTER — Ambulatory Visit: Payer: Self-pay

## 2024-04-08 DIAGNOSIS — Z113 Encounter for screening for infections with a predominantly sexual mode of transmission: Secondary | ICD-10-CM | POA: Insufficient documentation

## 2024-04-08 DIAGNOSIS — M545 Low back pain, unspecified: Secondary | ICD-10-CM | POA: Insufficient documentation

## 2024-04-08 LAB — POCT URINALYSIS DIP (MANUAL ENTRY)
Bilirubin, UA: NEGATIVE
Blood, UA: NEGATIVE
Glucose, UA: NEGATIVE mg/dL
Ketones, POC UA: NEGATIVE mg/dL
Leukocytes, UA: NEGATIVE
Nitrite, UA: NEGATIVE
Spec Grav, UA: 1.025
Urobilinogen, UA: 0.2 U/dL
pH, UA: 5.5

## 2024-04-08 LAB — POCT URINE PREGNANCY: Preg Test, Ur: NEGATIVE

## 2024-04-08 MED ORDER — IBUPROFEN 600 MG PO TABS: 600.0000 mg | ORAL_TABLET | Freq: Four times a day (QID) | ORAL | 0 refills | Status: AC | PRN

## 2024-04-08 MED ORDER — METHOCARBAMOL 500 MG PO TABS: 500.0000 mg | ORAL_TABLET | Freq: Two times a day (BID) | ORAL | 0 refills | Status: AC | PRN

## 2024-04-08 NOTE — Discharge Instructions (Addendum)
 Take ibuprofen  as needed for discomfort.  Take the muscle relaxer as needed for muscle spasm; Do not drive, operate machinery, or drink alcohol with this medication as it can cause drowsiness.  Follow up with your primary care provider if your symptoms are not improving.    Your urine does not show signs of infection.  Urine pregnancy is negative.  Your other tests are pending.

## 2024-04-08 NOTE — ED Triage Notes (Signed)
 Pt reports she has been having back pain x 3 days. States she cannot sleep.   Tried muscle relaxers and Voltaren but no relief.   Pt states she has been having unprotected intercourse recently and does not get periods due to unknown medical reasoning.

## 2024-04-08 NOTE — ED Provider Notes (Signed)
 Kathryn Nash    CSN: [id number] Arrival date & time: 04/08/24  0955      History   Chief Complaint Chief Complaint  Patient presents with   Back Pain   SEXUALLY TRANSMITTED DISEASE    HPI Kathryn Nash is a 28 y.o. female.  Patient presents with bilateral lower back pain x 3 days.  No falls or injury.  The pain does not radiate.  She denies numbness, weakness, paresthesias, saddle anesthesia, loss of bowel/bladder control, abdominal pain, dysuria, hematuria, fever, vaginal discharge, pelvic pain.  Patient also presents with request for STD testing.  She has had unprotected sex but is asymptomatic.  Her medical history includes sciatica, morbid obesity, asthma, recurrent bacterial vaginitis, chlamydia.  The history is provided by the patient and medical records.    Past Medical History:  Diagnosis Date   Asthma    Chlamydia     Patient Active Problem List   Diagnosis Date Noted   Acute right-sided low back pain with right-sided sciatica 06/18/2022   Leukemoid reaction 06/18/2022   OSA (obstructive sleep apnea) 05/14/2022   Morbid obesity (HCC) 226 lbs BMI=37.2 10/20/2019   Asthma exacerbation 12/08/2018    Past Surgical History:  Procedure Laterality Date   NO PAST SURGERIES      OB History     Gravida  2   Para      Term      Preterm      AB  1   Living  0      SAB  1   IAB      Ectopic      Multiple      Live Births               Home Medications    Prior to Admission medications   Medication Sig Start Date End Date Taking? Authorizing Provider  ibuprofen  (ADVIL ) 600 MG tablet Take 1 tablet (600 mg total) by mouth every 6 (six) hours as needed. 04/08/24  Yes Wellington Half, NP  methocarbamol (ROBAXIN) 500 MG tablet Take 1 tablet (500 mg total) by mouth 2 (two) times daily as needed for muscle spasms. 04/08/24  Yes Wellington Half, NP  albuterol  (VENTOLIN  HFA) 108 (90 Base) MCG/ACT inhaler Inhale 2 puffs into the lungs 2 (two) times  daily. in the morning and at bedtime. 08/13/23   White, Maybelle Spatz, NP  azithromycin  (ZITHROMAX ) 250 MG tablet Take 1 tablet (250 mg total) by mouth daily. Take first 2 tablets together, then 1 every day until finished. 02/17/24   Wellington Half, NP  budesonide -formoterol  (SYMBICORT ) 80-4.5 MCG/ACT inhaler Inhale 2 puffs into the lungs in the morning and at bedtime. 12/26/23   White, Adrienne R, NP  ipratropium (ATROVENT ) 0.03 % nasal spray Place 2 sprays into both nostrils every 12 (twelve) hours. 12/26/23   Reena Canning, NP  metroNIDAZOLE  (METROGEL ) 0.75 % vaginal gel Place 1 Applicatorful vaginally at bedtime. 03/24/24   Farris Hong, PA-C  Norethindrone  Acetate-Ethinyl Estradiol (JUNEL 1.5/30) 1.5-30 MG-MCG tablet Take 1 tablet by mouth daily. 11/08/23   Merleen Stare, NP    Family History Family History  Problem Relation Age of Onset   Asthma Mother    Asthma Father    Diabetes Maternal Grandmother    Asthma Maternal Grandmother     Social History Social History   Tobacco Use   Smoking status: Former    Types: Cigarettes   Smokeless tobacco: Never  Tobacco comments:    Never used e-cigarettes  Vaping Use   Vaping status: Never Used  Substance Use Topics   Alcohol use: Not Currently    Alcohol/week: 1.0 standard drink of alcohol    Types: 1 Shots of liquor per week    Comment: last use 07/2022 "occassionally"   Drug use: Not Currently    Types: Marijuana    Comment: last use 2021     Allergies   Egg-derived products, Iohexol , Montelukast, Montelukast sodium, Fish-derived products, and Penicillins   Review of Systems Review of Systems  Constitutional:  Negative for chills and fever.  Gastrointestinal:  Negative for abdominal pain, diarrhea and vomiting.  Genitourinary:  Negative for dysuria, flank pain, hematuria, pelvic pain and vaginal discharge.  Musculoskeletal:  Positive for back pain. Negative for gait problem.  Skin:  Negative for color change, rash and  wound.  Neurological:  Negative for weakness and numbness.     Physical Exam Triage Vital Signs ED Triage Vitals  Encounter Vitals Group     BP      Systolic BP Percentile      Diastolic BP Percentile      Pulse      Resp      Temp      Temp src      SpO2      Weight      Height      Head Circumference      Peak Flow      Pain Score      Pain Loc      Pain Education      Exclude from Growth Chart    No data found.  Updated Vital Signs BP 115/75 (BP Location: Left Arm)   Pulse 73   Temp 98.8 F (37.1 C) (Oral)   Resp 16   SpO2 94%   Visual Acuity Right Eye Distance:   Left Eye Distance:   Bilateral Distance:    Right Eye Near:   Left Eye Near:    Bilateral Near:     Physical Exam Constitutional:      General: She is not in acute distress.    Appearance: She is obese.  HENT:     Mouth/Throat:     Mouth: Mucous membranes are moist.  Cardiovascular:     Rate and Rhythm: Normal rate and regular rhythm.  Pulmonary:     Effort: Pulmonary effort is normal. No respiratory distress.  Abdominal:     General: Bowel sounds are normal.     Palpations: Abdomen is soft.     Tenderness: There is no abdominal tenderness. There is no right CVA tenderness, left CVA tenderness, guarding or rebound.  Musculoskeletal:        General: No swelling, tenderness or deformity. Normal range of motion.  Skin:    Capillary Refill: Capillary refill takes less than 2 seconds.     Findings: No bruising, erythema, lesion or rash.  Neurological:     General: No focal deficit present.     Mental Status: She is alert and oriented to person, place, and time.     Sensory: No sensory deficit.     Motor: No weakness.     Gait: Gait normal.      UC Treatments / Results  Labs (all labs ordered are listed, but only abnormal results are displayed) Labs Reviewed  POCT URINALYSIS DIP (MANUAL ENTRY) - Abnormal; Notable for the following components:      Result Value  Protein Ur, POC  trace (*)    All other components within normal limits  POCT URINE PREGNANCY - Normal  CERVICOVAGINAL ANCILLARY ONLY    EKG   Radiology No results found.  Procedures Procedures (including critical care time)  Medications Ordered in UC Medications - No data to display  Initial Impression / Assessment and Plan / UC Course  I have reviewed the triage vital signs and the nursing notes.  Pertinent labs & imaging results that were available during my care of the patient were reviewed by me and considered in my medical decision making (see chart for details).   Acute low back pain without sciatica.  STD screening.  Afebrile and vital signs are stable.  Urine does not indicate infection.  Urine pregnancy negative.  Patient obtained vaginal self swab for testing.  Discussed that her test results will be available in MyChart and that we will call her if treatment is needed.  Treating her back pain with ibuprofen  and methocarbamol.  Precautions for drowsiness with methocarbamol discussed.  Education provided on back pain.  Instructed patient to follow-up with her PCP if she is not improving.  She agrees to plan of care.  Final Clinical Impressions(s) / UC Diagnoses   Final diagnoses:  Acute bilateral low back pain without sciatica  Screening for STD (sexually transmitted disease)     Discharge Instructions      Take ibuprofen  as needed for discomfort.  Take the muscle relaxer as needed for muscle spasm; Do not drive, operate machinery, or drink alcohol with this medication as it can cause drowsiness.  Follow up with your primary care provider if your symptoms are not improving.    Your urine does not show signs of infection.  Urine pregnancy is negative.  Your other tests are pending.     ED Prescriptions     Medication Sig Dispense Auth. Provider   ibuprofen  (ADVIL ) 600 MG tablet Take 1 tablet (600 mg total) by mouth every 6 (six) hours as needed. 30 tablet Wellington Half, NP    methocarbamol (ROBAXIN) 500 MG tablet Take 1 tablet (500 mg total) by mouth 2 (two) times daily as needed for muscle spasms. 10 tablet Wellington Half, NP      I have reviewed the PDMP during this encounter.   Wellington Half, NP 04/08/24 1034

## 2024-04-09 LAB — CERVICOVAGINAL ANCILLARY ONLY
Chlamydia: NEGATIVE
Comment: NEGATIVE
Comment: NORMAL
Neisseria Gonorrhea: NEGATIVE

## 2024-05-15 DIAGNOSIS — F411 Generalized anxiety disorder: Secondary | ICD-10-CM | POA: Insufficient documentation

## 2024-05-15 DIAGNOSIS — Z1330 Encounter for screening examination for mental health and behavioral disorders, unspecified: Secondary | ICD-10-CM | POA: Insufficient documentation

## 2024-05-15 DIAGNOSIS — Z1331 Encounter for screening for depression: Secondary | ICD-10-CM | POA: Insufficient documentation

## 2024-05-15 DIAGNOSIS — F331 Major depressive disorder, recurrent, moderate: Secondary | ICD-10-CM | POA: Insufficient documentation

## 2024-07-06 ENCOUNTER — Telehealth: Admitting: Physician Assistant

## 2024-07-06 DIAGNOSIS — N760 Acute vaginitis: Secondary | ICD-10-CM | POA: Diagnosis not present

## 2024-07-06 MED ORDER — FLUCONAZOLE 150 MG PO TABS: 150.0000 mg | ORAL_TABLET | ORAL | 0 refills | Status: AC

## 2024-07-06 NOTE — Progress Notes (Signed)

## 2024-07-06 NOTE — Progress Notes (Signed)
 I have spent 5 minutes in review of e-visit questionnaire, review and updating patient chart, medical decision making and response to patient.   Laure Kidney, PA-C

## 2024-07-19 ENCOUNTER — Telehealth: Admitting: Nurse Practitioner

## 2024-07-19 DIAGNOSIS — N760 Acute vaginitis: Secondary | ICD-10-CM | POA: Diagnosis not present

## 2024-07-19 DIAGNOSIS — B9689 Other specified bacterial agents as the cause of diseases classified elsewhere: Secondary | ICD-10-CM | POA: Diagnosis not present

## 2024-07-19 MED ORDER — METRONIDAZOLE 0.75 % VA GEL
1.0000 | Freq: Every day | VAGINAL | 0 refills | Status: AC
Start: 2024-07-19 — End: ?

## 2024-07-19 NOTE — Progress Notes (Signed)
 Please be mindful that if you experience recurrent vaginal symptoms within the next 30-45 days you will need to be seen in person for a vaginal swab to confirm true vaginitis. You can have testing done at your local health department if you do not have a PCP  E-Visit for Vaginal Symptoms  We are sorry that you are not feeling well. Here is how we plan to help! Based on what you shared with me it looks like you: May have a vaginosis due to bacteria  Vaginosis is an inflammation of the vagina that can result in discharge, itching and pain. The cause is usually a change in the normal balance of vaginal bacteria or an infection. Vaginosis can also result from reduced estrogen levels after menopause.  The most common causes of vaginosis are:   Bacterial vaginosis which results from an overgrowth of one on several organisms that are normally present in your vagina.   Yeast infections which are caused by a naturally occurring fungus called candida.   Vaginal atrophy (atrophic vaginosis) which results from the thinning of the vagina from reduced estrogen levels after menopause.   Trichomoniasis which is caused by a parasite and is commonly transmitted by sexual intercourse.  Factors that increase your risk of developing vaginosis include: Medications, such as antibiotics and steroids Uncontrolled diabetes Use of hygiene products such as bubble bath, vaginal spray or vaginal deodorant Douching Wearing damp or tight-fitting clothing Using an intrauterine device (IUD) for birth control Hormonal changes, such as those associated with pregnancy, birth control pills or menopause Sexual activity Having a sexually transmitted infection  Your treatment plan is Metrogel  nightly for 5-7 nights  Be sure to take all of the medication as directed. Stop taking any medication if you develop a rash, tongue swelling or shortness of breath. Mothers who are breast feeding should consider pumping and discarding  their breast milk while on these antibiotics. However, there is no consensus that infant exposure at these doses would be harmful.  Remember that medication creams can weaken latex condoms. SABRA   HOME CARE:  Good hygiene may prevent some types of vaginosis from recurring and may relieve some symptoms:  Avoid baths, hot tubs and whirlpool spas. Rinse soap from your outer genital area after a shower, and dry the area well to prevent irritation. Don't use scented or harsh soaps, such as those with deodorant or antibacterial action. Avoid irritants. These include scented tampons and pads. Wipe from front to back after using the toilet. Doing so avoids spreading fecal bacteria to your vagina.  Other things that may help prevent vaginosis include:  Don't douche. Your vagina doesn't require cleansing other than normal bathing. Repetitive douching disrupts the normal organisms that reside in the vagina and can actually increase your risk of vaginal infection. Douching won't clear up a vaginal infection. Use a latex condom. Both female and female latex condoms may help you avoid infections spread by sexual contact. Wear cotton underwear. Also wear pantyhose with a cotton crotch. If you feel comfortable without it, skip wearing underwear to bed. Yeast thrives in [employer] Your symptoms should improve in the next day or two.  GET HELP RIGHT AWAY IF:  You have pain in your lower abdomen ( pelvic area or over your ovaries) You develop nausea or vomiting You develop a fever Your discharge changes or worsens You have persistent pain with intercourse You develop shortness of breath, a rapid pulse, or you faint.  These symptoms could be signs of  problems or infections that need to be evaluated by a medical provider now.  MAKE SURE YOU   Understand these instructions. Will watch your condition. Will get help right away if you are not doing well or get worse.  Thank you for choosing an  e-visit.  Your e-visit answers were reviewed by a board certified advanced clinical practitioner to complete your personal care plan. Depending upon the condition, your plan could have included both over the counter or prescription medications.  Please review your pharmacy choice. Make sure the pharmacy is open so you can pick up prescription now. If there is a problem, you may contact your provider through [employer] and have the prescription routed to another pharmacy.  Your safety is important to us . If you have drug allergies check your prescription carefully.   For the next 24 hours you can use MyChart to ask questions about today's visit, request a non-urgent call back, or ask for a work or school excuse. You will get an email in the next two days asking about your experience. I hope that your e-visit has been valuable and will speed your recovery.

## 2024-07-19 NOTE — Progress Notes (Signed)
 I have spent 5 minutes in review of e-visit questionnaire, review and updating patient chart, medical decision making and response to patient.   Claiborne Rigg, NP

## 2024-08-04 ENCOUNTER — Ambulatory Visit
Admission: EM | Admit: 2024-08-04 | Discharge: 2024-08-04 | Disposition: A | Discharge status: Home / Self Care | Attending: Emergency Medicine | Admitting: Emergency Medicine

## 2024-08-04 DIAGNOSIS — Z113 Encounter for screening for infections with a predominantly sexual mode of transmission: Secondary | ICD-10-CM | POA: Insufficient documentation

## 2024-08-04 DIAGNOSIS — N898 Other specified noninflammatory disorders of vagina: Secondary | ICD-10-CM

## 2024-08-04 DIAGNOSIS — Z3202 Encounter for pregnancy test, result negative: Secondary | ICD-10-CM

## 2024-08-04 LAB — POCT URINE PREGNANCY: Preg Test, Ur: NEGATIVE

## 2024-08-04 NOTE — Discharge Instructions (Signed)
 Your pregnancy test is negative.    Your vaginal tests are pending.  If your test results are positive, we will call you.  You and your sexual partner(s) may require treatment at that time.  Do not have sexual activity for at least 7 days.    Follow-up with your primary care provider or gynecologist if your symptoms are not improving.

## 2024-08-04 NOTE — ED Provider Notes (Signed)
 Kathryn Nash    CSN: [id number] Arrival date & time: 08/04/24  0831      History   Chief Complaint Chief Complaint  Patient presents with   SEXUALLY TRANSMITTED DISEASE    HPI Kathryn Nash is a 28 y.o. female.  Patient presents with 1 week history of vaginal discharge.  She has been treating this with MetroGel  x 1 dose that she used on 08/02/2024.  Patient requests STD testing as well as testing for BV and yeast.  She also requests a pregnancy test.  She denies fever, chills, rash, abdominal pain, dysuria, hematuria, pelvic pain.  Patient had a telehealth visit on 07/19/2024; diagnosed with bacterial vaginosis; treated with metronidazole  vaginal gel which she did not use until the one dose on 08/02/2024.  She also had a telehealth visit on 07/06/2024; diagnosed with vaginosis; treated with Diflucan  which she did take as prescribed.  The history is provided by the patient and medical records.    Past Medical History:  Diagnosis Date   Asthma    Chlamydia     Patient Active Problem List   Diagnosis Date Noted   Acute right-sided low back pain with right-sided sciatica 06/18/2022   Leukemoid reaction 06/18/2022   OSA (obstructive sleep apnea) 05/14/2022   Morbid obesity (HCC) 226 lbs BMI=37.2 10/20/2019   Asthma exacerbation 12/08/2018    Past Surgical History:  Procedure Laterality Date   NO PAST SURGERIES      OB History     Gravida  2   Para      Term      Preterm      AB  1   Living  0      SAB  1   IAB      Ectopic      Multiple      Live Births               Home Medications    Prior to Admission medications   Medication Sig Start Date End Date Taking? Authorizing Provider  albuterol  (VENTOLIN  HFA) 108 (90 Base) MCG/ACT inhaler Inhale 2 puffs into the lungs 2 (two) times daily. in the morning and at bedtime. 08/13/23   White, Shelba SAUNDERS, NP  azithromycin  (ZITHROMAX ) 250 MG tablet Take 1 tablet (250 mg total) by mouth daily. Take  first 2 tablets together, then 1 every day until finished. Patient not taking: Reported on 08/04/2024 02/17/24   Corlis Burnard DEL, NP  budesonide -formoterol  (SYMBICORT ) 80-4.5 MCG/ACT inhaler Inhale 2 puffs into the lungs in the morning and at bedtime. 12/26/23   Teresa Shelba SAUNDERS, NP  ibuprofen  (ADVIL ) 600 MG tablet Take 1 tablet (600 mg total) by mouth every 6 (six) hours as needed. 04/08/24   Corlis Burnard DEL, NP  ipratropium (ATROVENT ) 0.03 % nasal spray Place 2 sprays into both nostrils every 12 (twelve) hours. 12/26/23   White, Shelba SAUNDERS, NP  methocarbamol  (ROBAXIN ) 500 MG tablet Take 1 tablet (500 mg total) by mouth 2 (two) times daily as needed for muscle spasms. 04/08/24   Corlis Burnard DEL, NP  metroNIDAZOLE  (METROGEL ) 0.75 % vaginal gel Place 1 Applicatorful vaginally at bedtime. 07/19/24   Fleming, Zelda W, NP  Norethindrone  Acetate-Ethinyl Estradiol (JUNEL 1.5/30) 1.5-30 MG-MCG tablet Take 1 tablet by mouth daily. 11/08/23   Idol, Janet L, NP    Family History Family History  Problem Relation Age of Onset   Asthma Mother    Asthma Father    Diabetes Maternal  Grandmother    Asthma Maternal Grandmother     Social History Social History   Tobacco Use   Smoking status: Former    Types: Cigarettes   Smokeless tobacco: Never   Tobacco comments:    Never used e-cigarettes  Vaping Use   Vaping status: Never Used  Substance Use Topics   Alcohol use: Not Currently    Alcohol/week: 1.0 standard drink of alcohol    Types: 1 Shots of liquor per week    Comment: last use 07/2022 occassionally   Drug use: Not Currently    Types: Marijuana    Comment: last use 2021     Allergies   Egg-derived products, Iohexol , Montelukast, Montelukast sodium, Fish-derived products, and Penicillins   Review of Systems Review of Systems  Constitutional:  Negative for chills and fever.  Gastrointestinal:  Negative for abdominal pain, constipation, diarrhea, nausea and vomiting.  Genitourinary:  Positive for  vaginal discharge. Negative for dysuria, flank pain, frequency, hematuria and pelvic pain.  Skin:  Negative for color change and rash.     Physical Exam Triage Vital Signs ED Triage Vitals  Encounter Vitals Group     BP 08/04/24 0856 111/80     Girls Systolic BP Percentile --      Girls Diastolic BP Percentile --      Boys Systolic BP Percentile --      Boys Diastolic BP Percentile --      Pulse Rate 08/04/24 0856 78     Resp 08/04/24 0856 18     Temp 08/04/24 0856 97.8 F (36.6 C)     Temp src --      SpO2 08/04/24 0856 98 %     Weight --      Height --      Head Circumference --      Peak Flow --      Pain Score 08/04/24 0846 0     Pain Loc --      Pain Education --      Exclude from Growth Chart --    No data found.  Updated Vital Signs BP 111/80   Pulse 78   Temp 97.8 F (36.6 C)   Resp 18   SpO2 98%   Visual Acuity Right Eye Distance:   Left Eye Distance:   Bilateral Distance:    Right Eye Near:   Left Eye Near:    Bilateral Near:     Physical Exam Constitutional:      General: She is not in acute distress. HENT:     Mouth/Throat:     Mouth: Mucous membranes are moist.  Cardiovascular:     Rate and Rhythm: Normal rate and regular rhythm.  Pulmonary:     Effort: Pulmonary effort is normal. No respiratory distress.  Abdominal:     General: Bowel sounds are normal.     Palpations: Abdomen is soft.     Tenderness: There is no abdominal tenderness. There is no right CVA tenderness, left CVA tenderness, guarding or rebound.  Neurological:     Mental Status: She is alert.      UC Treatments / Results  Labs (all labs ordered are listed, but only abnormal results are displayed) Labs Reviewed  POCT URINE PREGNANCY - Normal  CERVICOVAGINAL ANCILLARY ONLY    EKG   Radiology No results found.  Procedures Procedures (including critical care time)  Medications Ordered in UC Medications - No data to display  Initial Impression / Assessment  and Plan /  UC Course  I have reviewed the triage vital signs and the nursing notes.  Pertinent labs & imaging results that were available during my care of the patient were reviewed by me and considered in my medical decision making (see chart for details).    Vaginal discharge, STD screening, negative pregnancy test.  Afebrile and vital signs are stable.  Urine pregnancy negative.  Patient obtained vaginal self swab for testing.  Discussed that we will call if test results are positive.  Discussed that she may require treatment at that time.  Discussed that sexual partner(s) may also require treatment.  Instructed patient to abstain from sexual activity for at least 7 days.  Instructed her to follow-up with her PCP or gynecologist if her symptoms are not improving.  Patient agrees to plan of care.   Final Clinical Impressions(s) / UC Diagnoses   Final diagnoses:  Vaginal discharge  Screening for STD (sexually transmitted disease)  Negative pregnancy test     Discharge Instructions      Your pregnancy test is negative.    Your vaginal tests are pending.  If your test results are positive, we will call you.  You and your sexual partner(s) may require treatment at that time.  Do not have sexual activity for at least 7 days.    Follow-up with your primary care provider or gynecologist if your symptoms are not improving.      ED Prescriptions   None    PDMP not reviewed this encounter.   Corlis Burnard DEL, NP 08/04/24 [phone]

## 2024-08-04 NOTE — ED Triage Notes (Signed)
 Patient to Urgent Care for STD testing. Denies any known exposure.  Reports vaginal discharge- has been using metrogel  for BV. Symptoms x1 week. Started using metrogel  yesterday.   LMP first week of August- requests a pregnancy test.

## 2024-08-10 ENCOUNTER — Ambulatory Visit (HOSPITAL_COMMUNITY): Payer: Self-pay

## 2024-08-10 ENCOUNTER — Telehealth: Admitting: Physician Assistant

## 2024-08-10 DIAGNOSIS — J4541 Moderate persistent asthma with (acute) exacerbation: Secondary | ICD-10-CM | POA: Diagnosis not present

## 2024-08-10 LAB — CERVICOVAGINAL ANCILLARY ONLY
Candida Glabrata: NEGATIVE
Candida Vaginitis: NEGATIVE
Chlamydia: NEGATIVE
Comment: NEGATIVE
Comment: NEGATIVE
Comment: NEGATIVE
Comment: NEGATIVE
Comment: NEGATIVE
Comment: NORMAL
Neisseria Gonorrhea: NEGATIVE
Trichomonas: POSITIVE — AB

## 2024-08-10 MED ORDER — METRONIDAZOLE 500 MG PO TABS: 500.0000 mg | ORAL_TABLET | Freq: Two times a day (BID) | ORAL | 0 refills | Status: AC

## 2024-08-10 MED ORDER — BUDESONIDE-FORMOTEROL FUMARATE 80-4.5 MCG/ACT IN AERO: 2.0000 | INHALATION_SPRAY | Freq: Two times a day (BID) | RESPIRATORY_TRACT | 2 refills | Status: AC

## 2024-08-10 MED ORDER — ALBUTEROL SULFATE HFA 108 (90 BASE) MCG/ACT IN AERS: 1.0000 | INHALATION_SPRAY | Freq: Four times a day (QID) | RESPIRATORY_TRACT | 0 refills | Status: AC | PRN

## 2024-08-10 NOTE — Progress Notes (Signed)

## 2024-08-10 NOTE — Addendum Note (Signed)
 Addended by: VIVIENNE DELON HERO on: 08/10/2024 07:28 AM   Modules accepted: Orders

## 2024-09-12 ENCOUNTER — Ambulatory Visit
Admission: EM | Admit: 2024-09-12 | Discharge: 2024-09-12 | Disposition: A | Discharge status: Home / Self Care | Attending: Emergency Medicine | Admitting: Emergency Medicine

## 2024-09-12 ENCOUNTER — Ambulatory Visit (INDEPENDENT_AMBULATORY_CARE_PROVIDER_SITE_OTHER)

## 2024-09-12 ENCOUNTER — Ambulatory Visit: Payer: Self-pay

## 2024-09-12 ENCOUNTER — Encounter: Payer: Self-pay | Admitting: Emergency Medicine

## 2024-09-12 DIAGNOSIS — Z113 Encounter for screening for infections with a predominantly sexual mode of transmission: Secondary | ICD-10-CM | POA: Insufficient documentation

## 2024-09-12 DIAGNOSIS — M25551 Pain in right hip: Secondary | ICD-10-CM

## 2024-09-12 MED ORDER — PREDNISONE 10 MG (21) PO TBPK: ORAL_TABLET | Freq: Every day | ORAL | 0 refills | Status: DC

## 2024-09-12 MED ORDER — KETOROLAC TROMETHAMINE 30 MG/ML IJ SOLN
30.0000 mg | Freq: Once | INTRAMUSCULAR | Status: AC
  Administered 2024-09-12: 30 mg via INTRAMUSCULAR

## 2024-09-12 NOTE — Discharge Instructions (Addendum)
 Your pain is most likely caused by irritation to the muscles or ligaments.   You have been given an injection of Toradol  to help reduce inflammation and pain and ideally will start to see some relief within the hour  Start tomorrow take prednisone  every morning with food as directed to continue the above process, avoid ibuprofen  while taking you may use Tylenol  or any topical medicine  You may use heating pad in 15 minute intervals as needed for additional comfort  Begin stretching affected area daily for 10 minutes as tolerated to further loosen muscles, exercises and packet  When lying down place pillow underneath the hip and between knees for support  If pain persist after recommended treatment or reoccurs if may be beneficial to follow up with orthopedic specialist for evaluation, this doctor specializes in the bones and can manage your symptoms long-term with options such as but not limited to imaging, medications or physical therapy    Labs pending 2-3 days, you will be contacted if positive for any sti and treatment will be sent to the pharmacy, you will have to return to the clinic if positive for gonorrhea to receive treatment   Please refrain from having sex until labs results, if positive please refrain from having sex until treatment complete and symptoms resolve   If positive for , Chlamydia  gonorrhea or trichomoniasis please notify partner or partners so they may tested as well   it is recommended you use some form of protection against the transmission of sti infections  such as condoms or dental dams with each sexual encounter

## 2024-09-12 NOTE — ED Provider Notes (Signed)
 [name]    CSN: [id number] Arrival date & time: 09/12/24  9072      History   Chief Complaint Chief Complaint  Patient presents with   Hip Pain   SEXUALLY TRANSMITTED DISEASE    HPI Kathryn Nash is a 28 y.o. female.   Patient presents for evaluation of constant right hip pain present for 4 months without precipitating event, injury or trauma.  Endorses that she was at work when symptoms started abruptly, intermittently radiating into the lower back and down into the thigh.  Pain progressively worsening, described as sharp and shooting when it first it was dull and aching.  Exacerbated by standing walking and sitting.  Denies numbness or tingling.  Has been managing with ibuprofen .  Did attempt muscle relaxant but ineffective.  Requesting routine STI check, declining blood work.  Recent treatment of trichomoniasis, symptoms resolved, took the medicine as directed.  Past Medical History:  Diagnosis Date   Asthma    Chlamydia     Patient Active Problem List   Diagnosis Date Noted   Screening for depression 05/15/2024   Encounter for behavioral health screening 05/15/2024   Major depressive disorder, recurrent, moderate (HCC) 05/15/2024   GAD (generalized anxiety disorder) 05/15/2024   Acute right-sided low back pain with right-sided sciatica 06/18/2022   Leukemoid reaction 06/18/2022   OSA (obstructive sleep apnea) 05/14/2022   Morbid obesity (HCC) 226 lbs BMI=37.2 10/20/2019   Asthma exacerbation 12/08/2018    Past Surgical History:  Procedure Laterality Date   NO PAST SURGERIES      OB History     Gravida  2   Para      Term      Preterm      AB  1   Living  0      SAB  1   IAB      Ectopic      Multiple      Live Births               Home Medications    Prior to Admission medications   Medication Sig Start Date End Date Taking? Authorizing Provider  predniSONE  (STERAPRED UNI-PAK 21 TAB) 10 MG (21) TBPK tablet Take by  mouth daily. Take 6 tabs by mouth daily  for 1 days, then 5 tabs for 1 days, then 4 tabs for 1 days, then 3 tabs for 1 days, 2 tabs for 1 days, then 1 tab by mouth daily for 1 days 09/12/24  Yes Oliva Montecalvo R, NP  albuterol  (VENTOLIN  HFA) 108 (90 Base) MCG/ACT inhaler Inhale 1-2 puffs into the lungs every 6 (six) hours as needed. 08/10/24   Vivienne Delon HERO, PA-C  budesonide -formoterol  (SYMBICORT ) 80-4.5 MCG/ACT inhaler Inhale 2 puffs into the lungs in the morning and at bedtime. 08/10/24   Vivienne Delon HERO, PA-C  hydrOXYzine (ATARAX) 25 MG tablet Take 25 mg by mouth 3 (three) times daily as needed. 05/15/24   [provider]  ibuprofen  (ADVIL ) 600 MG tablet Take 1 tablet (600 mg total) by mouth every 6 (six) hours as needed. 04/08/24   Corlis Burnard DEL, NP  ipratropium (ATROVENT ) 0.03 % nasal spray Place 2 sprays into both nostrils every 12 (twelve) hours. 12/26/23   [place] Ramone, Shelba SAUNDERS, NP  methocarbamol  (ROBAXIN ) 500 MG tablet Take 1 tablet (500 mg total) by mouth 2 (two) times daily as needed for muscle spasms. 04/08/24   Corlis Burnard DEL, NP  metroNIDAZOLE  (METROGEL ) 0.75 % vaginal  gel Place 1 Applicatorful vaginally at bedtime. 07/19/24   Fleming, Zelda W, NP  Norethindrone  Acetate-Ethinyl Estradiol (JUNEL 1.5/30) 1.5-30 MG-MCG tablet Take 1 tablet by mouth daily. 11/08/23   Birdena Clarita CROME, NP  phentermine 37.5 MG capsule Take 37.5 mg by mouth every morning. 04/29/24   [provider]    Family History Family History  Problem Relation Age of Onset   Asthma Mother    Asthma Father    Diabetes Maternal Grandmother    Asthma Maternal Grandmother     Social History Social History   Tobacco Use   Smoking status: Former    Types: Cigarettes   Smokeless tobacco: Never   Tobacco comments:    Never used e-cigarettes  Vaping Use   Vaping status: Never Used  Substance Use Topics   Alcohol use: Not Currently    Alcohol/week: 1.0 standard drink of alcohol    Types: 1 Shots of  liquor per week    Comment: last use 07/2022 occassionally   Drug use: Not Currently    Types: Marijuana    Comment: last use 2021     Allergies   Egg protein-containing drug products; Egg solids, whole; Iohexol ; Montelukast; Montelukast sodium; Fish protein-containing drug products; and Penicillins   Review of Systems Review of Systems   Physical Exam Triage Vital Signs ED Triage Vitals  Encounter Vitals Group     BP 09/12/24 1020 112/76     Girls Systolic BP Percentile --      Girls Diastolic BP Percentile --      Boys Systolic BP Percentile --      Boys Diastolic BP Percentile --      Pulse Rate 09/12/24 1020 80     Resp 09/12/24 1020 18     Temp 09/12/24 1020 98.3 F (36.8 C)     Temp Source 09/12/24 1020 Oral     SpO2 09/12/24 1020 97 %     Weight --      Height --      Head Circumference --      Peak Flow --      Pain Score 09/12/24 1016 8     Pain Loc --      Pain Education --      Exclude from Growth Chart --    No data found.  Updated Vital Signs BP 112/76 (BP Location: Left Arm)   Pulse 80   Temp 98.3 F (36.8 C) (Oral)   Resp 18   SpO2 97%   Visual Acuity Right Eye Distance:   Left Eye Distance:   Bilateral Distance:    Right Eye Near:   Left Eye Near:    Bilateral Near:     Physical Exam Constitutional:      Appearance: Normal appearance.  Eyes:     Extraocular Movements: Extraocular movements intact.  Pulmonary:     Effort: Pulmonary effort is normal.  Musculoskeletal:     Comments: Tenderness present to the anterior of the right hip at the start of the groin without ecchymosis swelling or deformity, able to complete full range of motion but pain is elicited with abduction and adduction, able to bear weight to the lower extremity, 2+ femoral pulse  Neurological:     Mental Status: She is alert and oriented to person, place, and time. Mental status is at baseline.      UC Treatments / Results  Labs (all labs ordered are listed,  but only abnormal results are displayed) Labs Reviewed  CERVICOVAGINAL ANCILLARY ONLY    EKG   Radiology DG Hip Unilat With Pelvis 2-3 Views Right Result Date: 09/12/2024 CLINICAL DATA:  Four-month history of right hip pain. No known injury. EXAM: DG HIP (WITH OR WITHOUT PELVIS) 3V RIGHT COMPARISON:  None Available. FINDINGS: There is no evidence of hip fracture or dislocation. There is no evidence of arthropathy or other focal bone abnormality. IMPRESSION: No focal radiographic abnormality. Electronically Signed   By: Limin  Xu M.D.   On: 09/12/2024 10:47    Procedures Procedures (including critical care time)  Medications Ordered in UC Medications  ketorolac  (TORADOL ) 30 MG/ML injection 30 mg (30 mg Intramuscular Given 09/12/24 1056)    Initial Impression / Assessment and Plan / UC Course  I have reviewed the triage vital signs and the nursing notes.  Pertinent labs & imaging results that were available during my care of the patient were reviewed by me and considered in my medical decision making (see chart for details).  Acute right hip pain, routine screening for STI  X-ray negative, reported the patient via telephone, Toradol  IM given and prescribed prednisone  course for home use recommended supportive care through [facility], heat massage stretching with activity as tolerated, given written handout regarding stretching, recommended orthopedic follow-up due to timeline if symptoms do not improve with use of medication  STI labs pending will treat per protocol, advised abstinence until lab results, and/or treatment is complete, advised condom use during all sexual encounters moving, may follow-up with urgent care as needed  Final Clinical Impressions(s) / UC Diagnoses   Final diagnoses:  Acute right hip pain  Routine screening for STI (sexually transmitted infection)     Discharge Instructions      Your pain is most likely caused by irritation to the muscles or ligaments.    You have been given an injection of Toradol  to help reduce inflammation and pain and ideally will start to see some relief within the hour  Start tomorrow take prednisone  every morning with food as directed to continue the above process, avoid ibuprofen  while taking you may use Tylenol  or any topical medicine  You may use heating pad in 15 minute intervals as needed for additional comfort  Begin stretching affected area daily for 10 minutes as tolerated to further loosen muscles, exercises and packet  When lying down place pillow underneath the hip and between knees for support  If pain persist after recommended treatment or reoccurs if may be beneficial to follow up with orthopedic specialist for evaluation, this doctor specializes in the bones and can manage your symptoms long-term with options such as but not limited to imaging, medications or physical therapy    Labs pending 2-3 days, you will be contacted if positive for any sti and treatment will be sent to the pharmacy, you will have to return to the clinic if positive for gonorrhea to receive treatment   Please refrain from having sex until labs results, if positive please refrain from having sex until treatment complete and symptoms resolve   If positive for , Chlamydia  gonorrhea or trichomoniasis please notify partner or partners so they may tested as well   it is recommended you use some form of protection against the transmission of sti infections  such as condoms or dental dams with each sexual encounter      ED Prescriptions     Medication Sig Dispense Auth. Provider   predniSONE  (STERAPRED UNI-PAK 21 TAB) 10 MG (21) TBPK tablet Take by mouth  daily. Take 6 tabs by mouth daily  for 1 days, then 5 tabs for 1 days, then 4 tabs for 1 days, then 3 tabs for 1 days, 2 tabs for 1 days, then 1 tab by mouth daily for 1 days 21 tablet Meghanne Pletz, Shelba SAUNDERS, NP      PDMP not reviewed this encounter.   Teresa Shelba SAUNDERS,  NP 09/12/24 1245

## 2024-09-12 NOTE — ED Triage Notes (Signed)
 Patient complains of right hip pain x 4 months Patient denies injury. Patient also wanted to repeat test after having Trichomoniasis. Patient wants to see if Trichomoniasis has cleared up. Rates pain 8/10. Patient has not taken anything for symptoms.

## 2024-09-14 LAB — CERVICOVAGINAL ANCILLARY ONLY
Bacterial Vaginitis (gardnerella): NEGATIVE
Candida Glabrata: NEGATIVE
Candida Vaginitis: NEGATIVE
Chlamydia: NEGATIVE
Comment: NEGATIVE
Comment: NEGATIVE
Comment: NEGATIVE
Comment: NEGATIVE
Comment: NEGATIVE
Comment: NORMAL
Neisseria Gonorrhea: NEGATIVE
Trichomonas: NEGATIVE

## 2024-12-04 ENCOUNTER — Ambulatory Visit: Payer: Self-pay

## 2024-12-23 ENCOUNTER — Ambulatory Visit
Admission: RE | Admit: 2024-12-23 | Discharge: 2024-12-23 | Disposition: A | Discharge status: Home / Self Care | Source: Ambulatory Visit | Attending: Family Medicine | Admitting: Family Medicine

## 2024-12-23 VITALS — BP 132/88 | HR 74 | Temp 98.0°F | Resp 18 | Ht 66.0 in | Wt 253.0 lb

## 2024-12-23 DIAGNOSIS — N898 Other specified noninflammatory disorders of vagina: Secondary | ICD-10-CM | POA: Diagnosis present

## 2024-12-23 DIAGNOSIS — H6993 Unspecified Eustachian tube disorder, bilateral: Secondary | ICD-10-CM | POA: Insufficient documentation

## 2024-12-23 DIAGNOSIS — N760 Acute vaginitis: Secondary | ICD-10-CM | POA: Diagnosis not present

## 2024-12-23 NOTE — ED Provider Notes (Signed)
 " [facility]  Note:  This document was prepared using Conservation officer, historic buildings and may include unintentional dictation errors.  MRN: [id number] DOB: 1996/11/12  Subjective:   Kathryn Nash is a 29 y.o. female presenting for 3 day history of vaginal discharge. Denies fever, n/v, abdominal pain, pelvic pain, rashes, dysuria, urinary frequency, hematuria.  Has also had persistent intermittent bilateral ear fullness, pain, ear popping. Feels that her right ear drains.   Current Outpatient Medications  Medication Instructions   albuterol  (VENTOLIN  HFA) 108 (90 Base) MCG/ACT inhaler 1-2 puffs, Inhalation, Every 6 hours PRN   budesonide -formoterol  (SYMBICORT ) 80-4.5 MCG/ACT inhaler 2 puffs, Inhalation, 2 times daily   hydrOXYzine (ATARAX) 25 mg, 3 times daily PRN   ibuprofen  (ADVIL ) 600 mg, Oral, Every 6 hours PRN   ipratropium (ATROVENT ) 0.03 % nasal spray 2 sprays, Each Nare, Every 12 hours   methocarbamol  (ROBAXIN ) 500 mg, Oral, 2 times daily PRN   metroNIDAZOLE  (METROGEL ) 0.75 % vaginal gel 1 Applicatorful, Vaginal, Daily at bedtime   Norethindrone  Acetate-Ethinyl Estradiol (JUNEL 1.5/30) 1.5-30 MG-MCG tablet 1 tablet, Oral, Daily   phentermine 37.5 mg, Every morning   predniSONE  (STERAPRED UNI-PAK 21 TAB) 10 MG (21) TBPK tablet Oral, Daily, Take 6 tabs by mouth daily  for 1 days, then 5 tabs for 1 days, then 4 tabs for 1 days, then 3 tabs for 1 days, 2 tabs for 1 days, then 1 tab by mouth daily for 1 days   sertraline (ZOLOFT) 50 MG tablet TAKE HALF A TABLET (25MG ) DAILY X 2 WEEKS THEN INCREASE TO 1 TABLET (50MG ) DAILY    Allergies[1]  Past Medical History:  Diagnosis Date   Asthma    Chlamydia      Past Surgical History:  Procedure Laterality Date   NO PAST SURGERIES      Family History  Problem Relation Age of Onset   Asthma Mother    Asthma Father    Diabetes Maternal Grandmother    Asthma Maternal Grandmother     Social History    Occupational History   Not on file  Tobacco Use   Smoking status: Former    Types: Cigarettes   Smokeless tobacco: Never   Tobacco comments:    Never used e-cigarettes  Vaping Use   Vaping status: Never Used  Substance and Sexual Activity   Alcohol use: Not Currently    Alcohol/week: 1.0 standard drink of alcohol    Types: 1 Shots of liquor per week    Comment: last use 07/2022 occassionally   Drug use: Not Currently    Types: Marijuana    Comment: last use 2021   Sexual activity: Yes    Partners: Male    Birth control/protection: OCP     ROS   Objective:   Vitals: BP 132/88 (BP Location: Right Arm)   Pulse 74   Temp 98 F (36.7 C) (Oral)   Resp 18   Ht 5' 6 (1.676 m)   Wt 253 lb (114.8 kg)   LMP 12/03/2024   SpO2 95%   BMI 40.84 kg/m   Physical Exam Constitutional:      General: She is not in acute distress.    Appearance: Normal appearance. She is well-developed. She is not ill-appearing, toxic-appearing or diaphoretic.  HENT:     Head: Normocephalic and atraumatic.     Right Ear: Tympanic membrane, ear canal and external ear normal. No tenderness. There is no impacted cerumen. Tympanic membrane is  not injected, perforated, erythematous or bulging.     Left Ear: Tympanic membrane, ear canal and external ear normal. No tenderness. There is no impacted cerumen. Tympanic membrane is not injected, perforated, erythematous or bulging.     Nose: Nose normal.     Mouth/Throat:     Mouth: Mucous membranes are moist.  Eyes:     General: No scleral icterus.       Right eye: No discharge.        Left eye: No discharge.     Extraocular Movements: Extraocular movements intact.     Conjunctiva/sclera: Conjunctivae normal.  Cardiovascular:     Rate and Rhythm: Normal rate.  Pulmonary:     Effort: Pulmonary effort is normal.  Abdominal:     General: Bowel sounds are normal. There is no distension.     Palpations: Abdomen is soft. There is no mass.      Tenderness: There is no abdominal tenderness. There is no right CVA tenderness, left CVA tenderness, guarding or rebound.  Skin:    General: Skin is warm and dry.  Neurological:     General: No focal deficit present.     Mental Status: She is alert and oriented to person, place, and time.  Psychiatric:        Mood and Affect: Mood normal.        Behavior: Behavior normal.        Thought Content: Thought content normal.        Judgment: Judgment normal.     Assessment and Plan :   PDMP not reviewed this encounter.  1. Vaginal discharge   2. Acute vaginitis   3. Eustachian tube dysfunction, bilateral      Patient declined empiric treatment. Labs pending. Would like topical treatments if possible. Unremarkable ENT exam.  Will use conservative management for what I suspect is eustachian tube dysfunction.  Recommended starting Zyrtec , Sudafed. Use ibuprofen  prn for pain. Counseled patient on potential for adverse effects with medications prescribed/recommended today, ER and return-to-clinic precautions discussed, patient verbalized understanding.     [1]  Allergies Allergen Reactions   Egg Protein-Containing Drug Products Hives   Egg Solids, Whole Hives    Other Reaction(s): Other (See Comments)   Iohexol  Hives   Montelukast Hives    Singulair  Singulair   Montelukast Sodium Hives    Singulair   Fish Protein-Containing Drug Products Other (See Comments)    Hives   Penicillins Rash    Has patient had a PCN reaction causing immediate rash, facial/tongue/throat swelling, SOB or lightheadedness with hypotension: Unknown Has patient had a PCN reaction causing severe rash involving mucus membranes or skin necrosis: Unknown Has patient had a PCN reaction that required hospitalization: Unknown Has patient had a PCN reaction occurring within the last 10 years: Unknown If all of the above answers are NO, then may proceed with Cephalosporin use.      Christopher Savannah, PA-C 12/23/24  1414  "

## 2024-12-23 NOTE — Discharge Instructions (Addendum)
 Your test results should be available tomorrow.  Our results team will let you know if you need any kind of treatment for any positive test results.   For the ears, use Zyrtec , Sudafed and ibuprofen  to help with eustachian tube dysfunction.

## 2024-12-23 NOTE — ED Triage Notes (Signed)
 Pt states that she has some vaginal discharge and lower abdominal discomfort. X3 days

## 2024-12-24 ENCOUNTER — Ambulatory Visit: Payer: Self-pay

## 2024-12-24 LAB — CERVICOVAGINAL ANCILLARY ONLY
Bacterial Vaginitis (gardnerella): POSITIVE — AB
Candida Glabrata: NEGATIVE
Candida Vaginitis: NEGATIVE
Chlamydia: NEGATIVE
Comment: NEGATIVE
Comment: NEGATIVE
Comment: NEGATIVE
Comment: NEGATIVE
Comment: NEGATIVE
Comment: NORMAL
Neisseria Gonorrhea: NEGATIVE
Trichomonas: NEGATIVE

## 2024-12-25 ENCOUNTER — Ambulatory Visit (HOSPITAL_COMMUNITY): Payer: Self-pay

## 2024-12-25 MED ORDER — METRONIDAZOLE 0.75 % VA GEL: 1.0000 | Freq: Every day | VAGINAL | 0 refills | Status: AC

## 2024-12-26 ENCOUNTER — Ambulatory Visit: Admission: EM | Admit: 2024-12-26 | Discharge: 2024-12-26 | Disposition: A | Discharge status: Home / Self Care

## 2024-12-26 ENCOUNTER — Encounter: Payer: Self-pay | Admitting: *Deleted

## 2024-12-26 DIAGNOSIS — R070 Pain in throat: Secondary | ICD-10-CM

## 2024-12-26 DIAGNOSIS — H6993 Unspecified Eustachian tube disorder, bilateral: Secondary | ICD-10-CM

## 2024-12-26 LAB — POCT RAPID STREP A (OFFICE): Rapid Strep A Screen: NEGATIVE

## 2024-12-26 MED ORDER — PSEUDOEPHEDRINE HCL 30 MG PO TABS: 30.0000 mg | ORAL_TABLET | ORAL | 0 refills | Status: AC | PRN

## 2024-12-26 MED ORDER — FLUTICASONE PROPIONATE 50 MCG/ACT NA SUSP: 1.0000 | Freq: Every day | NASAL | 0 refills | Status: AC

## 2024-12-26 MED ORDER — PREDNISONE 50 MG PO TABS: ORAL_TABLET | ORAL | 0 refills | Status: AC

## 2024-12-26 NOTE — ED Triage Notes (Signed)
 Pt reports 2 days of sore throat. Denies fever. Also having headaches real bad migraines and abdominal pain. Denies n/v/d. She has been taking ibuprofen  with minimal relief of headache and using cough drops. She also started metrogel  last night for BV. She works in teacher, music.

## 2024-12-26 NOTE — Discharge Instructions (Addendum)
 You are neg for strep.   Will send sudafed, flonase , and a short course of steroids to pharmacy for eustachian tube dysfunction. For more info please read pamphlet about ETD added in discharge papers.

## 2024-12-26 NOTE — ED Provider Notes (Addendum)
 " [facility]    CSN: [id number] Arrival date & time: 12/26/24  1315      History   Chief Complaint Chief Complaint  Patient presents with   Sore Throat    HPI Kathryn Nash is a 29 y.o. female.   Pt presents today due to 2 days worth of throat pain associated with referred ear pain, headache, and abdominal pain. Pt states that she has been using ibuprofen  with minimal relief of symptoms. Pt states that in the past when she has had these symptoms it was due to ear fullness. Pt states that when she was seen 2 days ago at urgent care she was told that her ears were fine.   The history is provided by the patient.  Sore Throat    Past Medical History:  Diagnosis Date   Asthma    Chlamydia     Patient Active Problem List   Diagnosis Date Noted   Screening for depression 05/15/2024   Encounter for behavioral health screening 05/15/2024   Major depressive disorder, recurrent, moderate (HCC) 05/15/2024   GAD (generalized anxiety disorder) 05/15/2024   Acute right-sided low back pain with right-sided sciatica 06/18/2022   Leukemoid reaction 06/18/2022   OSA (obstructive sleep apnea) 05/14/2022   Morbid obesity (HCC) 226 lbs BMI=37.2 10/20/2019   Asthma exacerbation 12/08/2018    Past Surgical History:  Procedure Laterality Date   NO PAST SURGERIES      OB History     Gravida  2   Para      Term      Preterm      AB  1   Living  0      SAB  1   IAB      Ectopic      Multiple      Live Births               Home Medications    Prior to Admission medications  Medication Sig Start Date End Date Taking? Authorizing Provider  albuterol  (VENTOLIN  HFA) 108 (90 Base) MCG/ACT inhaler Inhale 1-2 puffs into the lungs every 6 (six) hours as needed. 08/10/24  Yes Vivienne Delon HERO, PA-C  budesonide -formoterol  (SYMBICORT ) 80-4.5 MCG/ACT inhaler Inhale 2 puffs into the lungs in the morning and at bedtime. 08/10/24  Yes Vivienne Delon HERO, PA-C   Cholecalciferol (VITAMIN D-1000 MAX ST) 25 MCG (1000 UT) tablet Take 1,000 Units by mouth daily. 09/22/24  Yes [provider]  ferrous sulfate 325 (65 FE) MG tablet Take 325 mg by mouth. 09/22/24  Yes [provider]  fluticasone  (FLONASE ) 50 MCG/ACT nasal spray Place 1 spray into both nostrils daily. 12/26/24  Yes Andra Corean BROCKS, PA-C  hydrOXYzine (ATARAX) 25 MG tablet Take 25 mg by mouth 3 (three) times daily as needed. 05/15/24  Yes [provider]  ipratropium (ATROVENT ) 0.03 % nasal spray Place 2 sprays into both nostrils every 12 (twelve) hours. 12/26/23  Yes White, Adrienne R, NP  metroNIDAZOLE  (METROGEL ) 0.75 % vaginal gel Place 1 Applicatorful vaginally at bedtime for 5 days. 12/25/24 12/30/24 Yes Vonna Sharlet POUR, MD  predniSONE  (DELTASONE ) 50 MG tablet Take one tab po daily for 5 days 12/26/24  Yes Andra Corean BROCKS, PA-C  pseudoephedrine  (SUDAFED) 30 MG tablet Take 1 tablet (30 mg total) by mouth every 4 (four) hours as needed for congestion. 12/26/24  Yes Andra Corean C, PA-C  sertraline (ZOLOFT) 50 MG tablet TAKE HALF A TABLET (25MG ) DAILY X  2 WEEKS THEN INCREASE TO 1 TABLET (50MG ) DAILY 09/29/24  Yes [provider]  ciprofloxacin-dexamethasone  (CIPRODEX) OTIC suspension Place 4 drops into both ears 2 (two) times daily. 09/21/24   [provider]  ibuprofen  (ADVIL ) 600 MG tablet Take 1 tablet (600 mg total) by mouth every 6 (six) hours as needed. Patient not taking: Reported on 12/26/2024 04/08/24   Corlis Burnard DEL, NP  medroxyPROGESTERone (PROVERA) 10 MG tablet Take 10 mg by mouth daily. Patient not taking: Reported on 12/26/2024 10/22/24   [provider]  methocarbamol  (ROBAXIN ) 500 MG tablet Take 1 tablet (500 mg total) by mouth 2 (two) times daily as needed for muscle spasms. 04/08/24   Corlis Burnard DEL, NP  metroNIDAZOLE  (METROGEL ) 0.75 % vaginal gel Place 1 Applicatorful vaginally at bedtime. 07/19/24   Fleming, Zelda W,  NP  Norethindrone  Acetate-Ethinyl Estradiol (JUNEL 1.5/30) 1.5-30 MG-MCG tablet Take 1 tablet by mouth daily. Patient not taking: Reported on 12/26/2024 11/08/23   Idol, Janet L, NP  phentermine 37.5 MG capsule Take 37.5 mg by mouth every morning. 04/29/24   [provider]    Family History Family History  Problem Relation Age of Onset   Asthma Mother    Asthma Father    Diabetes Maternal Grandmother    Asthma Maternal Grandmother     Social History Social History[1]   Allergies   Egg protein-containing drug products; Egg solids, whole; Iohexol ; Montelukast; Montelukast sodium; Fish protein-containing drug products; and Penicillins   Review of Systems Review of Systems   Physical Exam Triage Vital Signs ED Triage Vitals  Encounter Vitals Group     BP 12/26/24 1348 116/78     Girls Systolic BP Percentile --      Girls Diastolic BP Percentile --      Boys Systolic BP Percentile --      Boys Diastolic BP Percentile --      Pulse Rate 12/26/24 1348 81     Resp 12/26/24 1348 18     Temp 12/26/24 1348 98 F (36.7 C)     Temp Source 12/26/24 1348 Oral     SpO2 12/26/24 1348 96 %     Weight --      Height --      Head Circumference --      Peak Flow --      Pain Score 12/26/24 1357 4     Pain Loc --      Pain Education --      Exclude from Growth Chart --    No data found.  Updated Vital Signs BP 116/78 (BP Location: Left Arm)   Pulse 81   Temp 98 F (36.7 C) (Oral)   Resp 18   LMP 12/03/2024   SpO2 96%   Visual Acuity Right Eye Distance:   Left Eye Distance:   Bilateral Distance:    Right Eye Near:   Left Eye Near:    Bilateral Near:     Physical Exam Vitals and nursing note reviewed.  Constitutional:      General: She is not in acute distress.    Appearance: Normal appearance. She is not ill-appearing, toxic-appearing or diaphoretic.  HENT:     Mouth/Throat:     Mouth: Mucous membranes are moist.     Pharynx: Oropharynx is clear.  Posterior oropharyngeal erythema (mild) present. No oropharyngeal exudate.  Eyes:     General: No scleral icterus. Cardiovascular:     Rate and Rhythm: Normal rate and regular rhythm.  Heart sounds: Normal heart sounds.  Pulmonary:     Effort: Pulmonary effort is normal. No respiratory distress.     Breath sounds: Normal breath sounds. No wheezing or rhonchi.  Musculoskeletal:     Cervical back: Tenderness present.  Lymphadenopathy:     Cervical: No cervical adenopathy.  Skin:    General: Skin is warm.  Neurological:     Mental Status: She is alert and oriented to person, place, and time.  Psychiatric:        Mood and Affect: Mood normal.        Behavior: Behavior normal.      UC Treatments / Results  Labs (all labs ordered are listed, but only abnormal results are displayed) Labs Reviewed  POCT RAPID STREP A (OFFICE) - Normal    EKG   Radiology No results found.  Procedures Procedures (including critical care time)  Medications Ordered in UC Medications - No data to display  Initial Impression / Assessment and Plan / UC Course  I have reviewed the triage vital signs and the nursing notes.  Pertinent labs & imaging results that were available during my care of the patient were reviewed by me and considered in my medical decision making (see chart for details).      Final Clinical Impressions(s) / UC Diagnoses   Final diagnoses:  Throat pain  Eustachian tube dysfunction, bilateral     Discharge Instructions      You are neg for strep.   Will send sudafed, flonase , and a short course of steroids to pharmacy for eustachian tube dysfunction. For more info please read pamphlet about ETD added in discharge papers.      ED Prescriptions     Medication Sig Dispense Auth. Provider   pseudoephedrine  (SUDAFED) 30 MG tablet Take 1 tablet (30 mg total) by mouth every 4 (four) hours as needed for congestion. 30 tablet Camiah Humm C, PA-C    fluticasone  (FLONASE ) 50 MCG/ACT nasal spray Place 1 spray into both nostrils daily. 16 g Americus Perkey C, PA-C   predniSONE  (DELTASONE ) 50 MG tablet Take one tab po daily for 5 days 5 tablet Andra Corean BROCKS, PA-C      PDMP not reviewed this encounter.    Andra Corean BROCKS, PA-C 12/26/24 1434     [1]  Social History Tobacco Use   Smoking status: Former    Types: Cigarettes   Smokeless tobacco: Never   Tobacco comments:    Never used e-cigarettes  Vaping Use   Vaping status: Never Used  Substance Use Topics   Alcohol use: Not Currently    Alcohol/week: 1.0 standard drink of alcohol    Types: 1 Shots of liquor per week    Comment: last use 07/2022 occassionally   Drug use: Not Currently    Types: Marijuana    Comment: last use 2021     Andra Corean BROCKS, PA-C 12/26/24 1437  "
